# Patient Record
Sex: Female | Born: 1955 | Race: Black or African American | Hispanic: No | Marital: Married | Smoking: Never smoker
Health system: Southern US, Community
[De-identification: ages and names within clinical notes are randomized; demographics above are authoritative.]

## PROBLEM LIST (undated history)

## (undated) DIAGNOSIS — I82409 Acute embolism and thrombosis of unspecified deep veins of unspecified lower extremity: Secondary | ICD-10-CM

## (undated) DIAGNOSIS — R002 Palpitations: Secondary | ICD-10-CM

## (undated) DIAGNOSIS — R110 Nausea: Secondary | ICD-10-CM

## (undated) DIAGNOSIS — D649 Anemia, unspecified: Secondary | ICD-10-CM

## (undated) DIAGNOSIS — M1990 Unspecified osteoarthritis, unspecified site: Secondary | ICD-10-CM

## (undated) DIAGNOSIS — I10 Essential (primary) hypertension: Secondary | ICD-10-CM

## (undated) DIAGNOSIS — R42 Dizziness and giddiness: Secondary | ICD-10-CM

## (undated) HISTORY — DX: Essential (primary) hypertension: I10

## (undated) HISTORY — PX: FRACTURE SURGERY: SHX138

## (undated) HISTORY — DX: Unspecified osteoarthritis, unspecified site: M19.90

## (undated) HISTORY — DX: Dizziness and giddiness: R42

## (undated) HISTORY — PX: COLONOSCOPY: SHX174

## (undated) HISTORY — PX: TUBAL LIGATION: SHX77

## (undated) HISTORY — DX: Nausea: R11.0

## (undated) HISTORY — PX: ABDOMINAL HYSTERECTOMY: SHX81

## (undated) HISTORY — DX: Hypercalcemia: E83.52

## (undated) HISTORY — DX: Palpitations: R00.2

---

## 2000-10-29 ENCOUNTER — Encounter (INDEPENDENT_AMBULATORY_CARE_PROVIDER_SITE_OTHER): Payer: Self-pay

## 2000-10-29 ENCOUNTER — Other Ambulatory Visit: Admission: RE | Admit: 2000-10-29 | Discharge: 2000-10-29 | Payer: Self-pay | Admitting: Obstetrics and Gynecology

## 2001-08-19 ENCOUNTER — Encounter: Payer: Self-pay | Admitting: Obstetrics and Gynecology

## 2001-08-19 ENCOUNTER — Ambulatory Visit (HOSPITAL_COMMUNITY): Admission: RE | Admit: 2001-08-19 | Discharge: 2001-08-19 | Payer: Self-pay | Admitting: Obstetrics and Gynecology

## 2001-09-18 ENCOUNTER — Other Ambulatory Visit: Admission: RE | Admit: 2001-09-18 | Discharge: 2001-09-18 | Payer: Self-pay | Admitting: Obstetrics and Gynecology

## 2002-08-13 ENCOUNTER — Other Ambulatory Visit: Admission: RE | Admit: 2002-08-13 | Discharge: 2002-08-13 | Payer: Self-pay | Admitting: Obstetrics and Gynecology

## 2002-08-26 ENCOUNTER — Encounter: Payer: Self-pay | Admitting: Obstetrics and Gynecology

## 2002-08-26 ENCOUNTER — Ambulatory Visit (HOSPITAL_COMMUNITY): Admission: RE | Admit: 2002-08-26 | Discharge: 2002-08-26 | Payer: Self-pay | Admitting: Obstetrics and Gynecology

## 2003-09-24 ENCOUNTER — Other Ambulatory Visit: Admission: RE | Admit: 2003-09-24 | Discharge: 2003-09-24 | Payer: Self-pay | Admitting: Obstetrics and Gynecology

## 2003-09-30 ENCOUNTER — Ambulatory Visit (HOSPITAL_COMMUNITY): Admission: RE | Admit: 2003-09-30 | Discharge: 2003-09-30 | Payer: Self-pay | Admitting: Obstetrics and Gynecology

## 2004-01-07 ENCOUNTER — Encounter (INDEPENDENT_AMBULATORY_CARE_PROVIDER_SITE_OTHER): Payer: Self-pay | Admitting: *Deleted

## 2004-01-07 ENCOUNTER — Observation Stay (HOSPITAL_COMMUNITY): Admission: RE | Admit: 2004-01-07 | Discharge: 2004-01-08 | Payer: Self-pay | Admitting: Obstetrics and Gynecology

## 2004-10-05 ENCOUNTER — Other Ambulatory Visit: Admission: RE | Admit: 2004-10-05 | Discharge: 2004-10-05 | Payer: Self-pay | Admitting: Obstetrics and Gynecology

## 2004-10-13 ENCOUNTER — Ambulatory Visit (HOSPITAL_COMMUNITY): Admission: RE | Admit: 2004-10-13 | Discharge: 2004-10-13 | Payer: Self-pay | Admitting: Obstetrics and Gynecology

## 2005-08-29 ENCOUNTER — Other Ambulatory Visit: Admission: RE | Admit: 2005-08-29 | Discharge: 2005-08-29 | Payer: Self-pay | Admitting: Obstetrics and Gynecology

## 2005-10-16 ENCOUNTER — Ambulatory Visit (HOSPITAL_COMMUNITY): Admission: RE | Admit: 2005-10-16 | Discharge: 2005-10-16 | Payer: Self-pay | Admitting: Obstetrics and Gynecology

## 2006-10-17 ENCOUNTER — Ambulatory Visit (HOSPITAL_COMMUNITY): Admission: RE | Admit: 2006-10-17 | Discharge: 2006-10-17 | Payer: Self-pay | Admitting: Obstetrics and Gynecology

## 2006-10-18 ENCOUNTER — Ambulatory Visit (HOSPITAL_COMMUNITY): Admission: RE | Admit: 2006-10-18 | Discharge: 2006-10-18 | Payer: Self-pay | Admitting: Obstetrics and Gynecology

## 2007-06-20 HISTORY — PX: OTHER SURGICAL HISTORY: SHX169

## 2007-11-13 ENCOUNTER — Ambulatory Visit (HOSPITAL_COMMUNITY): Admission: RE | Admit: 2007-11-13 | Discharge: 2007-11-13 | Payer: Self-pay | Admitting: Obstetrics and Gynecology

## 2009-04-26 ENCOUNTER — Ambulatory Visit (HOSPITAL_COMMUNITY): Admission: RE | Admit: 2009-04-26 | Discharge: 2009-04-26 | Payer: Self-pay | Admitting: Obstetrics and Gynecology

## 2010-05-02 ENCOUNTER — Ambulatory Visit (HOSPITAL_COMMUNITY)
Admission: RE | Admit: 2010-05-02 | Discharge: 2010-05-02 | Payer: Self-pay | Source: Home / Self Care | Admitting: Obstetrics and Gynecology

## 2010-07-20 ENCOUNTER — Emergency Department (HOSPITAL_COMMUNITY)
Admission: EM | Admit: 2010-07-20 | Discharge: 2010-07-20 | Disposition: A | Discharge status: Home / Self Care | Payer: BC Managed Care – PPO | Attending: Emergency Medicine | Admitting: Emergency Medicine

## 2010-07-20 DIAGNOSIS — I10 Essential (primary) hypertension: Secondary | ICD-10-CM | POA: Insufficient documentation

## 2010-07-20 DIAGNOSIS — R42 Dizziness and giddiness: Secondary | ICD-10-CM | POA: Insufficient documentation

## 2010-07-20 DIAGNOSIS — Z79899 Other long term (current) drug therapy: Secondary | ICD-10-CM | POA: Insufficient documentation

## 2010-07-20 DIAGNOSIS — R51 Headache: Secondary | ICD-10-CM | POA: Insufficient documentation

## 2010-07-20 LAB — POCT CARDIAC MARKERS
CKMB, poc: <1 ng/mL — ABNORMAL LOW (ref 1.0–8.0)
Myoglobin, poc: 59.2 ng/mL (ref 12–200)
Troponin i, poc: <0.05 ng/mL (ref 0.00–0.09)

## 2010-07-20 LAB — URINALYSIS, ROUTINE W REFLEX MICROSCOPIC
Bilirubin Urine: NEGATIVE
Nitrite: NEGATIVE
Specific Gravity, Urine: 1.011 (ref 1.005–1.030)
Urobilinogen, UA: 0.2 mg/dL (ref 0.0–1.0)
pH: 8 (ref 5.0–8.0)

## 2010-07-20 LAB — CBC
HCT: 39.6 % (ref 36.0–46.0)
MCHC: 33.6 g/dL (ref 30.0–36.0)
Platelets: 298 10*3/uL (ref 150–400)
RDW: 12.5 % (ref 11.5–15.5)
WBC: 6 10*3/uL (ref 4.0–10.5)

## 2010-07-20 LAB — POCT I-STAT, CHEM 8
Chloride: 98 mEq/L (ref 96–112)
HCT: 42 % (ref 36.0–46.0)
Hemoglobin: 14.3 g/dL (ref 12.0–15.0)
Potassium: 3.3 mEq/L — ABNORMAL LOW (ref 3.5–5.1)
Sodium: 138 mEq/L (ref 135–145)

## 2010-07-20 LAB — DIFFERENTIAL
Basophils Absolute: 0 10*3/uL (ref 0.0–0.1)
Eosinophils Absolute: 0.1 10*3/uL (ref 0.0–0.7)
Eosinophils Relative: 1 % (ref 0–5)
Lymphocytes Relative: 25 % (ref 12–46)
Monocytes Absolute: 0.4 10*3/uL (ref 0.1–1.0)

## 2010-07-25 ENCOUNTER — Ambulatory Visit
Admission: RE | Admit: 2010-07-25 | Discharge: 2010-07-25 | Disposition: A | Discharge status: Home / Self Care | Payer: BC Managed Care – PPO | Source: Ambulatory Visit | Attending: Internal Medicine | Admitting: Internal Medicine

## 2010-07-25 ENCOUNTER — Other Ambulatory Visit: Payer: Self-pay | Admitting: Internal Medicine

## 2010-07-25 DIAGNOSIS — R0781 Pleurodynia: Secondary | ICD-10-CM

## 2010-11-04 NOTE — H&P (Signed)
NAME:  Sierra Glover, Sierra Glover                       ACCOUNT NO.:  [id number]   MEDICAL RECORD NO.:  [id number]                   PATIENT TYPE:  OBV   LOCATION:  NA                                   FACILITY:  [facility]   PHYSICIAN:  Crist Fat. Rivard, M.D.              DATE OF BIRTH:  11-20-1955   DATE OF ADMISSION:  01/07/2004  DATE OF DISCHARGE:                                HISTORY & PHYSICAL   REASON FOR ADMISSION:  Uterine fibroids with menorrhagia and anemia.   HISTORY OF PRESENT ILLNESS:  This is a 55 year old married African-American  female, gravida 2, para 1, with known fibroids for many years.  She was seen  in April 2005 for her annual exam, reporting irregular cycles varying from  14 to 21 day length, lasting 7 to 14 days with 3 heavy days.  On those days,  she has to use one tampon and one pad every three hours and reports passing  clots as large as 2 cm.  She also now has a new onset of moderate  dysmenorrhea quoted at an intensity of 6/10, improved with Motrin 800 mg.  She has breakthrough bleeding and postmenstrual spotting for 2 to 3 days per  month.  Otherwise, she denies dyspareunia, post coital bleeding.   REVIEW OF SYSTEMS:  HEAD, EYES, EARS, NOSE, AND THROAT:  Negative.  HEART:  Negative.  LUNGS:  Negative.  GENITOURINARY:  Negative.  GASTROINTESTINAL:  Negative.  CONSTITUTIONAL:  Negative.  PSYCHIATRIC:  Negative.   PAST MEDICAL HISTORY:  1. Hypertension.  2. Status post cryotherapy in 1994.  3. Status post bilateral tubal ligation.  4. Uterine fibroids.  5. She has had one vaginal delivery.   ALLERGIES:  No known drug allergies.   CURRENT MEDICATIONS:  1. Hydrochlorothiazide 25 mg daily.  2. Allegra p.r.n.  3. Claritin p.r.n.  4. Aspirin 81 mg daily.  5. Multivitamins.   FAMILY HISTORY:  Remarkable for multiple family members with hypertension.  Negative for feminine or colon cancer.   LABORATORY AND X-RAY DATA:  Most recent evaluation reveals a Pap smear  in  April 2005 benign.   Endometrial biopsy in April 2005 benign secretory endometrium.  No  hyperplasia or malignancy identified.   Mammogram in April 2005 normal.   Sonohistogram on Oct 22, 2003, revealed a 3.6 posterior fibroid, 3.2 fundal  and anterior fibroid, 2.8 posterior fibroid, and 3 cm anterior fibroid, 2.8  cm lower uterine fibroid.  Three of the fibroids appear submucosal.  Right  ovary normal, left ovary not seen, and endometrial thickness 0.69 cm on an  unknown day of cycle.   PHYSICAL EXAMINATION:  VITAL SIGNS:  Current weight is 141 pounds for a  height of 5 feet 4-3/4 inches.  Blood pressure 120/80.  HEAD, EYES, EARS, NOSE, AND THROAT:  Normal.  NECK:  Thyroid not enlarged.  HEART:  Regular rate and rhythm.  CHEST:  Clear.  BREASTS:  Normal.  BACK:  No CVA tenderness.  ABDOMEN:  No tenderness, masses, or hepatosplenomegaly.  EXTREMITIES:  Negative.  NEUROLOGIC:  Within normal limits.  PELVIC:  Normal external genitalia, normal vagina, normal cervix.  Uterus is  anteverted, increased in size to 12 to 14 weeks, irregular but mobile.  Adnexa: No masses felt.  Rectovaginal exam is negative.   IMPRESSION:  Symptomatic uterine fibroids with menorrhagia and anemia.  Last  hemoglobin on September 24, 2003, was 11.0.   PLAN:  Management options have been reviewed with patient including medical  treatment with hormonal manipulation, hysteroscopy myomectomy plus/minus  endometrial ablation, or hysterectomy.  The patient wishes for definitive  treatment, and we have planned for laparoscopic-assisted vaginal  hysterectomy.  The procedure as well as possible complications including  bleeding, infection, and injury to other organs, need for laparotomy,  premature ovarian failure were thoroughly reviewed with patient, and all  questions were answered.  Informed consent was obtained.                                               Crist Fat Rivard, M.D.    SAR/MEDQ  D:   12/31/2003  T:  12/31/2003  Job:  [id number]

## 2010-11-04 NOTE — Op Note (Signed)
NAME:  Sierra Glover, Sierra Glover                       ACCOUNT NO.:  [id number]   MEDICAL RECORD NO.:  [id number]                   PATIENT TYPE:  OBV   LOCATION:  9302                                 FACILITY:  [facility]   PHYSICIAN:  Crist Fat. Rivard, M.D.              DATE OF BIRTH:  [birth year]   DATE OF PROCEDURE:  01/07/2004  DATE OF DISCHARGE:                                 OPERATIVE REPORT   PREOPERATIVE DIAGNOSES:  Uterine fibroids with menorrhagia and anemia.   POSTOPERATIVE DIAGNOSES:  Uterine fibroids with menorrhagia and anemia, left  ovarian benign cystadenoma per frozen section.   ANESTHESIA:  General.   PROCEDURE:  Laparoscopy assisted vaginal hysterectomy and myomectomy with  left salpingo-oophorectomy.   SURGEON:  Crist Fat. Rivard, M.D.   ASSISTANTMarquis Lunch. Lowell Guitar, P.A.   ESTIMATED BLOOD LOSS:  300 mL.   DESCRIPTION OF PROCEDURE:  After being informed of the planned procedure  with possible complications including bleeding, infection, injury to bowel,  bladder, or ureters, need for laparotomy informed consent was obtained. The  patient is taken to OR #2 and given general anesthesia with endotracheal  intubation without difficulty.  She is placed in the lithotomy position,  prepped and draped in a sterile fashion and a intrauterine manipulator is  placed in the uterus through the cervix.  A Foley catheter is inserted in  her bladder.   We proceed with infiltration of the umbilical area using 5 mL of Marcaine  0.25 and perform a 10 mm incision midline overlooking the previous incision  from tubal ligation, insert a Veress needle an insufflate a pneumoperitoneum  with CO2 at a maximum pressure of 15 mmHg.  The Veress needle is removed and  a 10 mm trocar is inserted easily.  A laparoscope is inserted which is  mounted on a video monitor. We are in the abdominopelvic cavity.  Two 5 mm  trocar are inserted in the left and right lower quadrant under direct  visualization  after infiltrating with 3 mL each of Marcaine 0.25.    Observation:  The uterus has multiple fibroids and is compatible with 5-14  week size.  Tubes are status post tubal ligation. Right ovary is normal,  left ovary is fairly large measuring 6.5 cm x 5 cm with 3 cysts or 1  multiloculated cyst. The cyst wall appears very small, there are no  nodularities seen. The uteroovarian ligament on that side is lengthened  which is compatible with an organic cyst.  The appendix is not visualized,  liver is normal and the stomach is well visualized and filled with air.  A  nasogastric tube is inserted to correct this completely.  We then insert a  10 mm suprapubic trocar after infiltrating with 4 mL of Marcaine 0.25 due to  the extreme size of the uterus which is very difficult to mobilize.  With  these three operative trocars, we are able to  mobilize the uterus enough to  visualize both ureters easily. We decide to proceed with our planned  procedure and start with cauterizing a round ligament on each using a  tripolar forceps.  These ligaments are then cauterized and sectioned. The  broad ligament is incised so to develop a bladder flap.  Both uteroovarian  ligaments are then isolated with the tripolar, cauterized and sectioned.  This brings [place] down to the uterine artery on each side.  We then proceed  with a vaginal time.  A weighted speculum is inserted, the cervix is grasped  with two Gerilyn Pilgrim forceps and the vaginal mucosa around the cervix is  infiltrated with lidocaine 1% epinephrine 1:100,000, 15 mL.  We can then  incise the vaginal mucosa in a circumferential manner and bluntly dissect  anterior posterior cul-de-sac.  The posterior cul-de-sac is entered easily  and a Bonanno weighted speculum is inserted. The anterior cul-de-sac is not  readily available but we are able to proceed with clamping of both  uterosacral ligaments using a Rogers forceps sectioning those ligaments and  suturing them  with a transfixed suture of #0 Vicryl kept for future  suspension.  We can now isolate the cardinal ligaments on each side with  Roger's forceps, section and suture them a transfixed suture of #0 Vicryl  and we can now proceed clamping uterine vessels on each side with Roger's  forceps sectioning those pedicles and suturing them with #0 Vicryl.  When we  proceed with section of the uterine vessels, this easily opens up the  anterior cul-de-sac.  It is impossible to deliver the uterus due to its size  and we proceed with morcellation and myomectomy. The cervix is split  longitudinally and the lower uterine body is split longitudinally up to the  fundus. In that process, we remove multiple fibroids bluntly which  significantly reduces the size of the uterus and we are now able to deliver  the uterus via the posterior cul-de-sac.  The last pedicle on each side is  clamped with a Roger's forceps and sectioned and the uterus is removed  completely with its cervix.  The final pedicles are sutured with a  transfixed suture of #0 Vicryl.  During that process, the left pedicle tears  and we have active arterial bleeding on that side. Fortunately the artery is  grasped with a Roger's forceps, suction is applied. We minimize the size of  that pedicle and we doubly suture it with #0 Vicryl. We then systematically  review hemostasis of all pedicles and this is found to be satisfactory.   We then do a hemostatic locked running suture on the posterior vaginal edge  reuniting the vaginal mucosa with both uterosacral ligaments using a #0  Vicryl.  We then close the peritoneum with two #0 Vicryl suture reuniting  the posterior peritoneum, uterosacral ligaments and anterior peritoneum on  each side.  Both uterosacral ligaments are then attached midline for future  suspension.  We can now close the vaginal mucosa using figure-of-eight stitches of #0 Vicryl. Hemostasis is quite adequate. We return to the   laparoscopy time, reinsufflate the pneumoperitoneum with CO2, irrigated  profusely and complete hemostasis on the left side on one of the left  pedicles using cautery. Both ureters are visualized and show normal  peristaltism and no dilatation.  We can now freely assess the left ovary  which was difficult to assess due to the size of the uterus and fairly  immobilize uterus.  We see  three distinct cysts with thickening of the  capsule between each cyst which could easily represent a multiloculated  cyst. Again there are no normal vessels, no nodularity but because of the  age of the patient and a normal appearing right ovary, a decision is made to  proceed with left salpingo-oophorectomy. The infundibulopelvic ligament is  identified away from the ureter and cauterized and sectioned using the  tripolar.  Via the 10 mm suprapubic trocar and an endobag, we can remove the  entire adnexa without any spillage. The bag is brought to the abdominal wall  and the cyst is ruptured in the bag, aspirated which reduces its volume and  is removed completely from the pelvic cavity without any spillage. The cyst  is then sent for frozen section.  The suprapubic incision is then closed  with a figure-of-eight stitch of #0 Vicryl closing the fascia and a 3-0  Vicryl subcu suture closing the skin. We again return in the pelvic cavity,  reinsffulate the pneumoperitoneum, irrigated profusely with warm saline and  note a small area of bleeding on the vaginal vault which is cauterized using  tripolar.  We again recheck all pedicles which now have a satisfactory  hemostasis and we recheck ureters which still maintain normal peristaltism  and no dilatation. Trocars are now removed under direct visualization after  removing all instruments and evacuating the pneumoperitoneum.  The fascia of  the umbilical incision is closed with a figure-of-eight stitch of #0 Vicryl  and all three remaining incisions are then closed  with subcuticular suture  of 3-0 Vicryl and Steri-Strips.   Instrument and sponge count is complete x2.  Estimated blood loss is 300 mL.  The procedure is very well tolerated by the patient who is taken to recovery  room in a well and stable condition.   Frozen section was called back from Dr. Jimmy Picket which revealed a benign  cystadenoma, uncertain whether it is serous or mucinous but definitely  benign cystadenoma.                                               Crist Fat Rivard, M.D.    SAR/MEDQ  D:  01/07/2004  T:  01/07/2004  Job:  [id number]

## 2010-11-04 NOTE — Discharge Summary (Signed)
NAME:  Sierra Glover, Sierra Glover                       ACCOUNT NO.:  [id number]   MEDICAL RECORD NO.:  [id number]                   PATIENT TYPE:  OBV   LOCATION:  9302                                 FACILITY:  [facility]   PHYSICIAN:  Crist Fat. Rivard, M.D.              DATE OF BIRTH:  1956-05-27   DATE OF ADMISSION:  01/07/2004  DATE OF DISCHARGE:  01/08/2004                                 DISCHARGE SUMMARY   DISCHARGE DIAGNOSES:  1. Symptomatic uterine fibroids.  2. Menorrhagia.  3. Anemia.  4. Left ovarian corpus luteum cyst.   OPERATION:  On the date of admission the patient underwent a laparoscopic-  assisted vaginal hysterectomy, myomectomy, and left salpingo-oophorectomy,  tolerating procedure well.  The patient was found to have a fibroid uterus  (weighing 420.4 g) with a left ovarian cyst (benign cystadenoma per frozen  section) with normal-appearing left and right tubes and right ovary.   HISTORY OF PRESENT ILLNESS:  Mrs. Wagoner is a 55 year old married African-  American female gravida 2 para 1 who presents for laparoscopic-assisted  vaginal hysterectomy because of symptomatic uterine fibroids.  Please see  the patient's dictated History and Physical Examination for details.   PREOPERATIVE PHYSICAL EXAMINATION:  VITAL SIGNS:  Blood pressure 120/80,  weight 141 pounds, height 5 feet 4.75 inches tall.  GENERAL:  Within normal limits.  PELVIC:  Normal external genitalia, normal vagina, normal cervix.  The  uterus is anteverted, increased to a size of 12-14 weeks, irregular but  mobile.  Adnexa without any palpable masses.  Rectovaginal exam was  negative.   HOSPITAL COURSE:  On the date of admission the patient underwent  aforementioned procedures, tolerating them all well.  Postoperative course  was unremarkable with the patient maintaining stability with a postoperative  hemoglobin of 6.2 (preoperative hemoglobin 10.4).  Having resumed bowel and  bladder function and achieving  the maximum benefit of her hospital stay, the  patient was discharged home on postoperative day #1.   DISCHARGE MEDICATIONS:  1. Ferrous sulfate 325 mg one tablet twice daily for 6 weeks.  2. Ibuprofen 600 mg one tablet with food q.6h. for 3 days then as needed for     pain.  3. Phenergan 25 mg one tablet q.6h. as needed for nausea.  4. Tylox one to two tablets q.4-6h. as needed for pain.   FOLLOW-UP:  The patient is scheduled for a 6 weeks postoperative visit with  Dr. Estanislado Pandy on February 17, 2004 at 3:15 p.m.   DISCHARGE INSTRUCTIONS:  The patient was given a copy of [facility] postoperative instruction sheet.  She was further advised to avoid  driving for 2 weeks, heavy lifting for 4 weeks, and intercourse for 6 weeks.  The patient's diet was without restriction.   FINAL PATHOLOGY:  1. Oophorectomy and salpingectomy, left ovary and fallopian tube:  Left     ovary with hemorrhagic corpus luteum.  Left  fallopian tube:  No     pathologic diagnosis.  2. Hysterectomy, uterus and cervix:  Cervix:  Squamous metaplasia and     chronic cervicitis.  Myometrium:  Multiple leiomyomata measuring up to     5.7 cm.  Serosal surface:  No pathologic diagnosis.  Endometrium:  Weakly-     secretory endometrium, negative for hyperplasia or malignancy.     [place] J. Adline Peals.                    Crist Fat Rivard, M.D.    EJP/MEDQ  D:  01/29/2004  T:  01/29/2004  Job:  [id number]

## 2010-11-17 DIAGNOSIS — E559 Vitamin D deficiency, unspecified: Secondary | ICD-10-CM | POA: Insufficient documentation

## 2011-04-04 ENCOUNTER — Other Ambulatory Visit (HOSPITAL_COMMUNITY): Payer: Self-pay | Admitting: Obstetrics and Gynecology

## 2011-04-04 DIAGNOSIS — Z1231 Encounter for screening mammogram for malignant neoplasm of breast: Secondary | ICD-10-CM

## 2011-05-05 ENCOUNTER — Ambulatory Visit (HOSPITAL_COMMUNITY)
Admission: RE | Admit: 2011-05-05 | Discharge: 2011-05-05 | Disposition: A | Discharge status: Home / Self Care | Payer: BC Managed Care – PPO | Source: Ambulatory Visit | Attending: Obstetrics and Gynecology | Admitting: Obstetrics and Gynecology

## 2011-05-05 DIAGNOSIS — Z1231 Encounter for screening mammogram for malignant neoplasm of breast: Secondary | ICD-10-CM | POA: Insufficient documentation

## 2012-06-27 ENCOUNTER — Ambulatory Visit: Payer: Self-pay | Admitting: Obstetrics and Gynecology

## 2013-05-27 DIAGNOSIS — R7309 Other abnormal glucose: Secondary | ICD-10-CM | POA: Insufficient documentation

## 2013-05-27 DIAGNOSIS — N951 Menopausal and female climacteric states: Secondary | ICD-10-CM | POA: Insufficient documentation

## 2014-02-18 ENCOUNTER — Other Ambulatory Visit (HOSPITAL_COMMUNITY): Payer: Self-pay | Admitting: Obstetrics and Gynecology

## 2014-02-18 DIAGNOSIS — Z1231 Encounter for screening mammogram for malignant neoplasm of breast: Secondary | ICD-10-CM

## 2014-03-04 ENCOUNTER — Ambulatory Visit (HOSPITAL_COMMUNITY)
Admission: RE | Admit: 2014-03-04 | Discharge: 2014-03-04 | Disposition: A | Discharge status: Home / Self Care | Payer: BC Managed Care – PPO | Source: Ambulatory Visit | Attending: Obstetrics and Gynecology | Admitting: Obstetrics and Gynecology

## 2014-03-04 DIAGNOSIS — Z1231 Encounter for screening mammogram for malignant neoplasm of breast: Secondary | ICD-10-CM | POA: Diagnosis not present

## 2014-08-06 ENCOUNTER — Other Ambulatory Visit: Payer: Self-pay | Admitting: Nurse Practitioner

## 2014-08-06 ENCOUNTER — Ambulatory Visit
Admission: RE | Admit: 2014-08-06 | Discharge: 2014-08-06 | Disposition: A | Discharge status: Home / Self Care | Payer: BLUE CROSS/BLUE SHIELD | Source: Ambulatory Visit | Attending: Nurse Practitioner | Admitting: Nurse Practitioner

## 2014-08-06 DIAGNOSIS — M25511 Pain in right shoulder: Secondary | ICD-10-CM

## 2016-01-24 ENCOUNTER — Other Ambulatory Visit: Payer: Self-pay | Admitting: Nurse Practitioner

## 2016-01-24 ENCOUNTER — Ambulatory Visit
Admission: RE | Admit: 2016-01-24 | Discharge: 2016-01-24 | Disposition: A | Discharge status: Home / Self Care | Payer: BLUE CROSS/BLUE SHIELD | Source: Ambulatory Visit | Attending: Internal Medicine | Admitting: Internal Medicine

## 2016-01-24 DIAGNOSIS — M549 Dorsalgia, unspecified: Secondary | ICD-10-CM

## 2016-03-02 IMAGING — CR DG RIBS W/ CHEST 3+V*L*
3 series · 3 of 3 positions shown · non-contrast
Comparison: Chest X ray July 25, 2010 and right rib detail
series of today's date

CLINICAL DATA: Right lateral chest wall and shoulder pain for the
past 3 weeks ; no respiratory discomfort, no known injury

EXAM:
LEFT RIBS AND CHEST - 3+ VIEW

[w chest pa *]
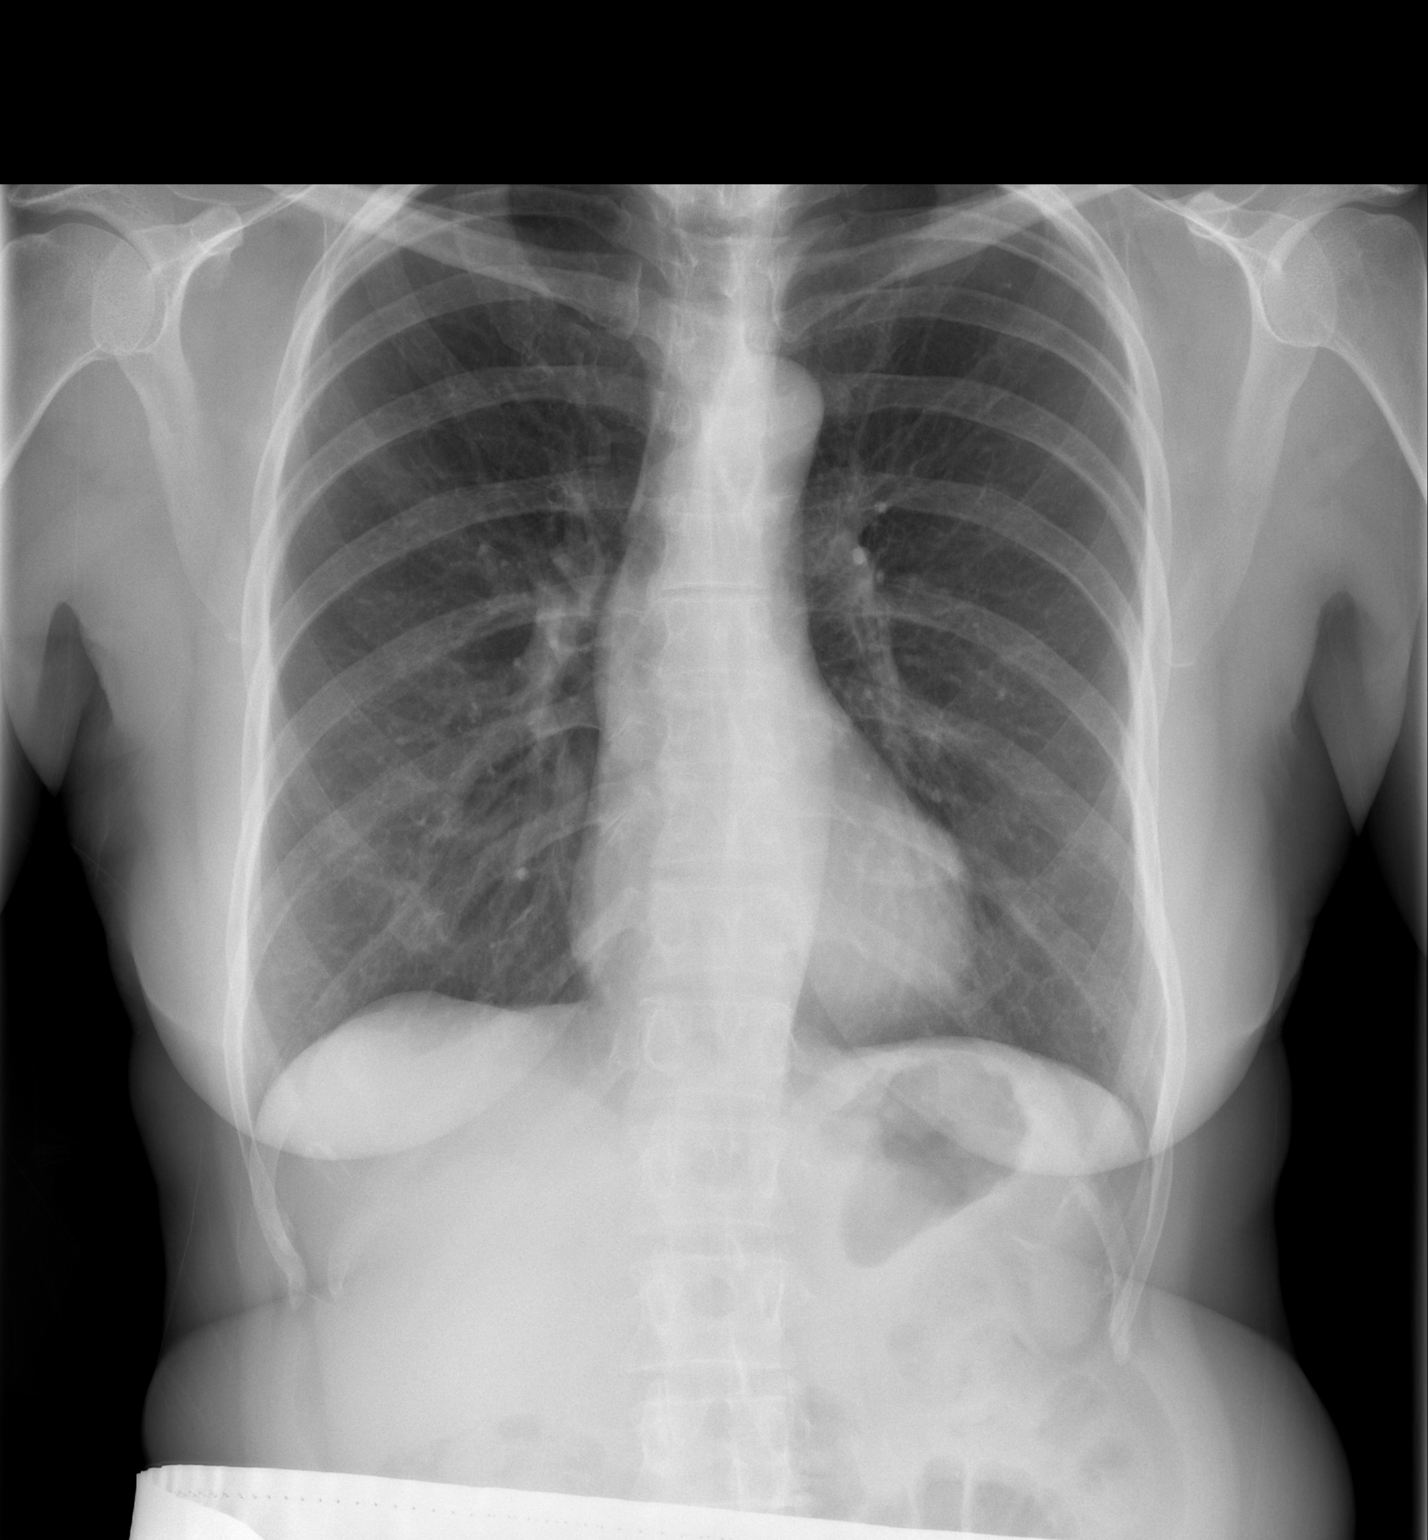

[w ribs ap/pa upper left]
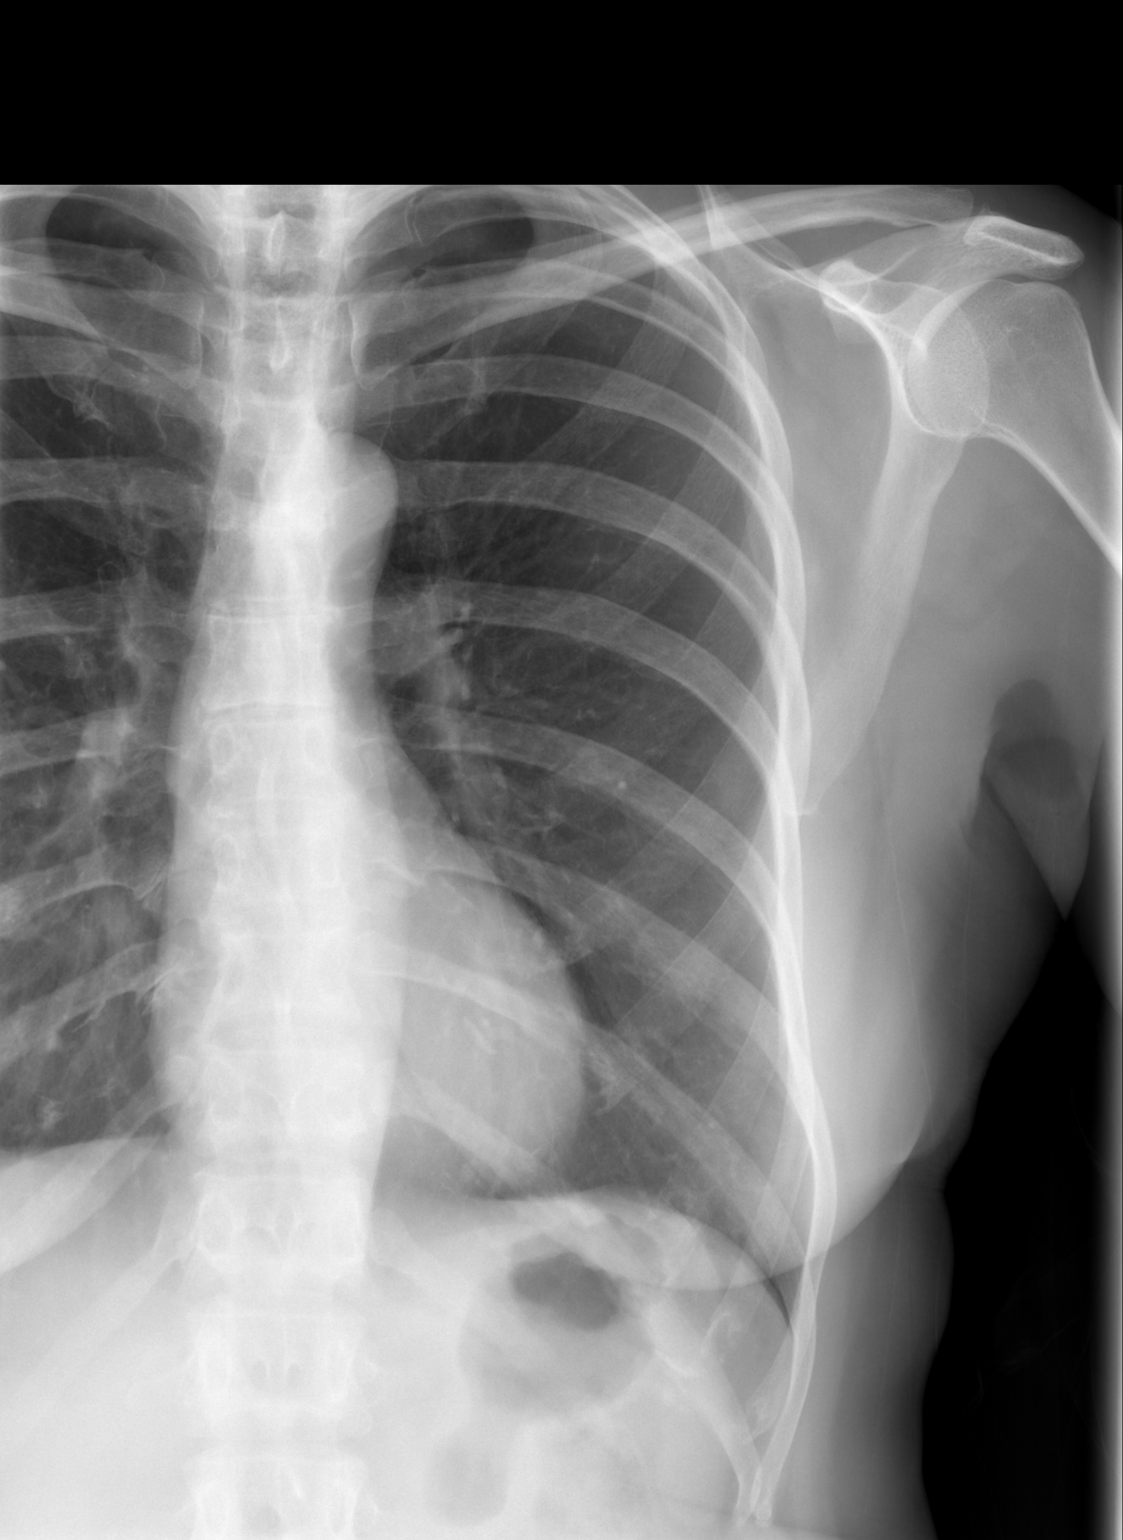

[w ribs oblique left]
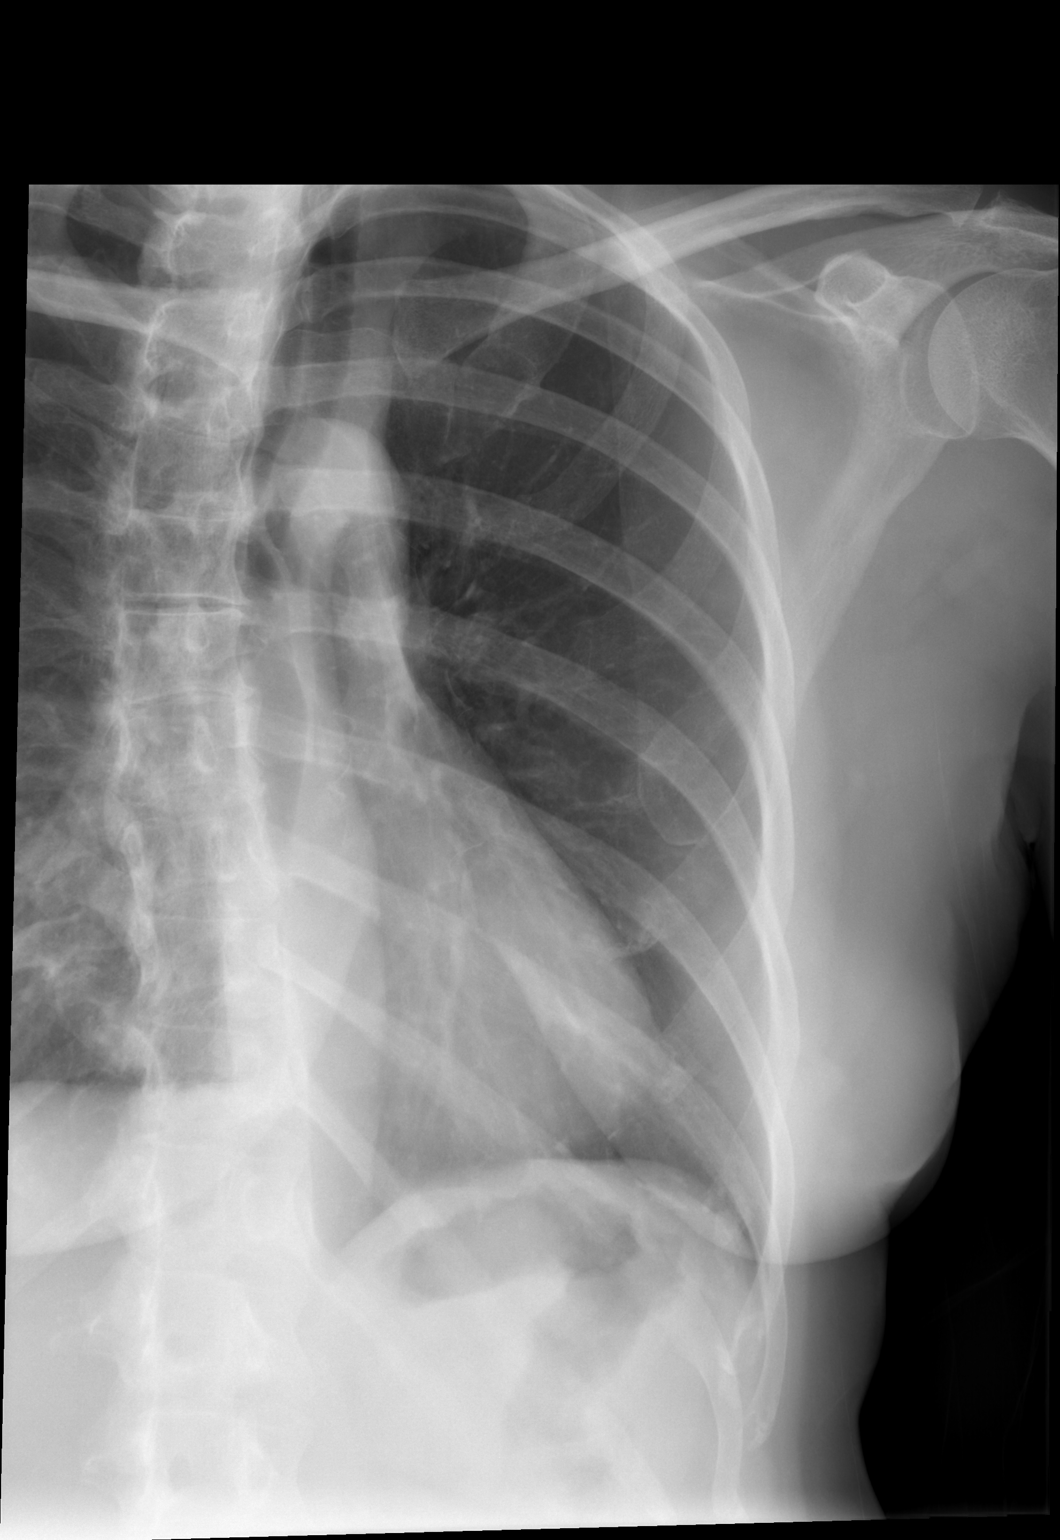

[3 of 3 positions shown; findings below may reference images not displayed]

FINDINGS: The lungs are well-expanded and clear. The heart and mediastinal
structures are normal. There is no pleural effusion or pneumothorax.
Left rib detail views reveal no acute or healing fracture. There is
no lytic or blastic lesion.
IMPRESSION: There is no active cardiopulmonary disease. No acute abnormality of
the left ribs is demonstrated.

## 2016-05-26 LAB — HM COLONOSCOPY

## 2017-10-03 ENCOUNTER — Other Ambulatory Visit: Payer: Self-pay | Admitting: Internal Medicine

## 2017-10-03 DIAGNOSIS — Z1231 Encounter for screening mammogram for malignant neoplasm of breast: Secondary | ICD-10-CM

## 2017-10-24 ENCOUNTER — Ambulatory Visit
Admission: RE | Admit: 2017-10-24 | Discharge: 2017-10-24 | Disposition: A | Discharge status: Home / Self Care | Payer: BLUE CROSS/BLUE SHIELD | Source: Ambulatory Visit | Attending: Internal Medicine | Admitting: Internal Medicine

## 2017-10-24 DIAGNOSIS — Z1231 Encounter for screening mammogram for malignant neoplasm of breast: Secondary | ICD-10-CM

## 2018-03-11 DIAGNOSIS — R300 Dysuria: Secondary | ICD-10-CM | POA: Diagnosis not present

## 2018-03-21 ENCOUNTER — Other Ambulatory Visit: Payer: Self-pay

## 2018-03-21 ENCOUNTER — Telehealth: Payer: Self-pay

## 2018-03-21 MED ORDER — CIPROFLOXACIN HCL 500 MG PO TABS: 500.0000 mg | ORAL_TABLET | Freq: Two times a day (BID) | ORAL | 0 refills | Status: AC

## 2018-03-21 NOTE — Telephone Encounter (Signed)
Patient notified that she has a UTI and we have sent her a prescription to the pharmacy. YRL,RMA

## 2018-03-26 ENCOUNTER — Telehealth: Payer: Self-pay

## 2018-03-26 NOTE — Telephone Encounter (Signed)
Patient is requesting lab results from her urine culture and vaginal swab. Please advise YRL,RMA

## 2018-03-27 NOTE — Telephone Encounter (Signed)
She is negative for bacterial vaginosis but she still has a UTI, send a Rx for Macrobid 150 mg twice a day and have her to come back after she completes the course of medication for a lab visit Ua/urine culture.

## 2018-03-29 ENCOUNTER — Other Ambulatory Visit: Payer: Self-pay

## 2018-03-29 NOTE — Telephone Encounter (Signed)
I am unable to locate the order for macrobid 150mg  bid for 5 days can you please put this order in will you put the order in so I can send it. YRL,RMA

## 2018-04-01 ENCOUNTER — Telehealth: Payer: Self-pay

## 2018-04-01 ENCOUNTER — Other Ambulatory Visit: Payer: Self-pay

## 2018-04-01 DIAGNOSIS — N390 Urinary tract infection, site not specified: Secondary | ICD-10-CM

## 2018-04-01 MED ORDER — NITROFURANTOIN MONOHYD MACRO 100 MG PO CAPS: 100.0000 mg | ORAL_CAPSULE | Freq: Two times a day (BID) | ORAL | 0 refills | Status: AC

## 2018-04-01 NOTE — Telephone Encounter (Signed)
Pt notified that her Rx has been sent to the pharmacy. YRL,RMA

## 2018-05-08 ENCOUNTER — Encounter: Payer: Self-pay | Admitting: Internal Medicine

## 2018-05-08 ENCOUNTER — Ambulatory Visit: Payer: BLUE CROSS/BLUE SHIELD | Admitting: Internal Medicine

## 2018-05-08 VITALS — BP 110/68 | HR 67 | Temp 98.2°F | Ht 63.5 in | Wt 150.8 lb

## 2018-05-08 DIAGNOSIS — N951 Menopausal and female climacteric states: Secondary | ICD-10-CM

## 2018-05-08 DIAGNOSIS — I10 Essential (primary) hypertension: Secondary | ICD-10-CM | POA: Diagnosis not present

## 2018-05-08 DIAGNOSIS — Z0000 Encounter for general adult medical examination without abnormal findings: Secondary | ICD-10-CM | POA: Diagnosis not present

## 2018-05-08 DIAGNOSIS — Z23 Encounter for immunization: Secondary | ICD-10-CM | POA: Diagnosis not present

## 2018-05-08 LAB — POCT URINALYSIS DIPSTICK
BILIRUBIN UA: NEGATIVE
Blood, UA: NEGATIVE
Glucose, UA: NEGATIVE
Ketones, UA: NEGATIVE
LEUKOCYTES UA: NEGATIVE
Nitrite, UA: NEGATIVE
PH UA: 8.5 — AB (ref 5.0–8.0)
Protein, UA: POSITIVE — AB
Spec Grav, UA: 1.015 (ref 1.010–1.025)
UROBILINOGEN UA: 0.2 U/dL

## 2018-05-08 LAB — POCT UA - MICROALBUMIN
Albumin/Creatinine Ratio, Urine, POC: 30
CREATININE, POC: 100 mg/dL
MICROALBUMIN (UR) POC: 10 mg/L

## 2018-05-08 MED ORDER — TETANUS-DIPHTH-ACELL PERTUSSIS 5-2.5-18.5 LF-MCG/0.5 IM SUSP
0.5000 mL | Freq: Once | INTRAMUSCULAR | Status: AC
  Administered 2018-05-08: 0.5 mL via INTRAMUSCULAR

## 2018-05-08 MED ORDER — ESTRADIOL 1 MG PO TABS: ORAL_TABLET | ORAL | 0 refills | Status: DC

## 2018-05-08 NOTE — Patient Instructions (Signed)
Exercising to Stay Healthy Exercising regularly is important. It has many health benefits, such as:  Improving your overall fitness, flexibility, and endurance.  Increasing your bone density.  Helping with weight control.  Decreasing your body fat.  Increasing your muscle strength.  Reducing stress and tension.  Improving your overall health.  In order to become healthy and stay healthy, it is recommended that you do moderate-intensity and vigorous-intensity exercise. You can tell that you are exercising at a moderate intensity if you have a higher heart rate and faster breathing, but you are still able to hold a conversation. You can tell that you are exercising at a vigorous intensity if you are breathing much harder and faster and cannot hold a conversation while exercising. How often should I exercise? Choose an activity that you enjoy and set realistic goals. Your health care provider can help you to make an activity plan that works for you. Exercise regularly as directed by your health care provider. This may include:  Doing resistance training twice each week, such as: ? Push-ups. ? Sit-ups. ? Lifting weights. ? Using resistance bands.  Doing a given intensity of exercise for a given amount of time. Choose from these options: ? 150 minutes of moderate-intensity exercise every week. ? 75 minutes of vigorous-intensity exercise every week. ? A mix of moderate-intensity and vigorous-intensity exercise every week.  Children, pregnant women, people who are out of shape, people who are overweight, and older adults may need to consult a health care provider for individual recommendations. If you have any sort of medical condition, be sure to consult your health care provider before starting a new exercise program. What are some exercise ideas? Some moderate-intensity exercise ideas include:  Walking at a rate of 1 mile in 15  minutes.  Biking.  Hiking.  Golfing.  Dancing.  Some vigorous-intensity exercise ideas include:  Walking at a rate of at least 4.5 miles per hour.  Jogging or running at a rate of 5 miles per hour.  Biking at a rate of at least 10 miles per hour.  Lap swimming.  Roller-skating or in-line skating.  Cross-country skiing.  Vigorous competitive sports, such as football, basketball, and soccer.  Jumping rope.  Aerobic dancing.  What are some everyday activities that can help me to get exercise?  Yard work, such as: ? Pushing a lawn mower. ? Raking and bagging leaves.  Washing and waxing your car.  Pushing a stroller.  Shoveling snow.  Gardening.  Washing windows or floors. How can I be more active in my day-to-day activities?  Use the stairs instead of the elevator.  Take a walk during your lunch break.  If you drive, park your car farther away from work or school.  If you take public transportation, get off one stop early and walk the rest of the way.  Make all of your phone calls while standing up and walking around.  Get up, stretch, and walk around every 30 minutes throughout the day. What guidelines should I follow while exercising?  Do not exercise so much that you hurt yourself, feel dizzy, or get very short of breath.  Consult your health care provider before starting a new exercise program.  Wear comfortable clothes and shoes with good support.  Drink plenty of water while you exercise to prevent dehydration or heat stroke. Body water is lost during exercise and must be replaced.  Work out until you breathe faster and your heart beats faster. This information is not   intended to replace advice given to you by your health care provider. Make sure you discuss any questions you have with your health care provider. Document Released: 07/08/2010 Document Revised: 11/11/2015 Document Reviewed: 11/06/2013 Elsevier Interactive Patient Education  2018  [employer]  

## 2018-05-09 LAB — LIPID PANEL
Chol/HDL Ratio: 4 ratio (ref 0.0–4.4)
Cholesterol, Total: 202 mg/dL — ABNORMAL HIGH (ref 100–199)
HDL: 51 mg/dL
LDL Calculated: 119 mg/dL — ABNORMAL HIGH (ref 0–99)
Triglycerides: 158 mg/dL — ABNORMAL HIGH (ref 0–149)
VLDL Cholesterol Cal: 32 mg/dL (ref 5–40)

## 2018-05-09 LAB — CBC
HEMATOCRIT: 39 % (ref 34.0–46.6)
HEMOGLOBIN: 12.8 g/dL (ref 11.1–15.9)
MCH: 27.7 pg (ref 26.6–33.0)
MCHC: 32.8 g/dL (ref 31.5–35.7)
MCV: 84 fL (ref 79–97)
Platelets: 298 10*3/uL (ref 150–450)
RBC: 4.62 x10E6/uL (ref 3.77–5.28)
RDW: 12.3 % (ref 12.3–15.4)
WBC: 5.8 10*3/uL (ref 3.4–10.8)

## 2018-05-09 LAB — CMP14+EGFR
ALK PHOS: 74 IU/L (ref 39–117)
ALT: 11 IU/L (ref 0–32)
AST: 21 IU/L (ref 0–40)
Albumin/Globulin Ratio: 1.5 (ref 1.2–2.2)
Albumin: 4.3 g/dL (ref 3.6–4.8)
BILIRUBIN TOTAL: 0.6 mg/dL (ref 0.0–1.2)
BUN/Creatinine Ratio: 12 (ref 12–28)
BUN: 10 mg/dL (ref 8–27)
CO2: 28 mmol/L (ref 20–29)
CREATININE: 0.83 mg/dL (ref 0.57–1.00)
Calcium: 9.8 mg/dL (ref 8.7–10.3)
Chloride: 98 mmol/L (ref 96–106)
GFR calc Af Amer: 87 mL/min/{1.73_m2} (ref >59)
GFR calc non Af Amer: 76 mL/min/{1.73_m2} (ref >59)
GLOBULIN, TOTAL: 2.8 g/dL (ref 1.5–4.5)
GLUCOSE: 84 mg/dL (ref 65–99)
POTASSIUM: 3.4 mmol/L — AB (ref 3.5–5.2)
Sodium: 140 mmol/L (ref 134–144)
Total Protein: 7.1 g/dL (ref 6.0–8.5)

## 2018-05-09 LAB — HEMOGLOBIN A1C
ESTIMATED AVERAGE GLUCOSE: 105 mg/dL
Hgb A1c MFr Bld: 5.3 % (ref 4.8–5.6)

## 2018-05-10 NOTE — Progress Notes (Signed)
Here are your lab results:  Your liver and kidney function are stable. Your potassium level is slightly low.  Please increase intake of potassium rich foods. I can mail you a list of foods if you like. Your blood count is normal. Your LDL, bad cholesterol, is 119. Ideally, this should be less than 100. Your hba1c is 5.3, you are not prediabetic.   Please let me know if you have any questions. Happy Thanksgiving to you and your family!  Sincerely,    Alhaji Mcneal N. Allyne GeeSanders, MD

## 2018-05-12 ENCOUNTER — Encounter: Payer: Self-pay | Admitting: Internal Medicine

## 2018-05-12 DIAGNOSIS — N951 Menopausal and female climacteric states: Secondary | ICD-10-CM | POA: Insufficient documentation

## 2018-05-12 DIAGNOSIS — I10 Essential (primary) hypertension: Secondary | ICD-10-CM | POA: Insufficient documentation

## 2018-05-12 NOTE — Progress Notes (Signed)
Subjective:     Patient ID: Sierra Glover , female    DOB: [birth year] , 62 y.o.   MRN: [id number]   Chief Complaint  Patient presents with  . Annual Exam  . Hypertension    HPI  She is here today for a full physical exam. She has her pelvic exams performed by Dr. Cletis Media.   Hypertension  This is a chronic problem. The current episode started more than 1 year ago. The problem has been gradually improving since onset. The problem is controlled. Pertinent negatives include no blurred vision, chest pain, headaches, neck pain, orthopnea, palpitations or shortness of breath. The current treatment provides moderate improvement. There are no compliance problems.      Past Medical History:  Diagnosis Date  . HTN (hypertension)      Family History  Problem Relation Age of Onset  . Alzheimer's disease Mother   . Hypertension Mother   . Hypertension Father   . COPD Father   . Hypertension Other      Current Outpatient Medications:  .  estradiol (ESTRACE) 1 MG tablet, take 1 tablet by oral route  every day, Disp: 90 tablet, Rfl: 0 .  fluticasone (VERAMYST) 27.5 MCG/SPRAY nasal spray, Place 2 sprays into the nose daily., Disp: , Rfl:  .  levocetirizine (XYZAL) 5 MG tablet, every evening., Disp: , Rfl: 2 .  triamterene-hydrochlorothiazide (MAXZIDE-25) 37.5-25 MG tablet, Take 1 tablet by mouth daily., Disp: , Rfl: 2  Current Facility-Administered Medications:  .  Tdap (BOOSTRIX) injection 0.5 mL, 0.5 mL, Intramuscular, Once, Glendale Chard, MD   No Known Allergies   Review of Systems  Constitutional: Negative.   HENT: Negative.   Eyes: Negative.  Negative for blurred vision.  Respiratory: Negative.  Negative for shortness of breath.   Cardiovascular: Negative.  Negative for chest pain, palpitations and orthopnea.  Gastrointestinal: Negative.   Endocrine: Negative.   Genitourinary: Positive for menstrual problem.       She sometimes still has hot flashes. She is on estradiol as  per Dr. Cletis Media.   Musculoskeletal: Negative.  Negative for neck pain.  Skin: Negative.   Allergic/Immunologic: Negative.   Neurological: Negative.  Negative for headaches.  Hematological: Negative.   Psychiatric/Behavioral: Negative.      Today's Vitals   05/08/18 1043  BP: 110/68  Pulse: 67  Temp: 98.2 F (36.8 C)  TempSrc: Oral  Weight: 150 lb 12.8 oz (68.4 kg)  Height: 5' 3.5" (1.613 m)   Body mass index is 26.29 kg/m.   Objective:  Physical Exam  Constitutional: She is oriented to person, place, and time. She appears well-developed and well-nourished.  HENT:  Head: Normocephalic and atraumatic.  Right Ear: External ear normal.  Left Ear: External ear normal.  Nose: Nose normal.  Mouth/Throat: Oropharynx is clear and moist.  Eyes: Pupils are equal, round, and reactive to light. Conjunctivae and EOM are normal.  Neck: Normal range of motion. Neck supple.  Cardiovascular: Normal rate, regular rhythm, normal heart sounds and intact distal pulses.  Pulmonary/Chest: Effort normal and breath sounds normal. Right breast exhibits no inverted nipple, no mass, no nipple discharge, no skin change and no tenderness. Left breast exhibits no inverted nipple, no mass, no nipple discharge, no skin change and no tenderness.  Abdominal: Soft. Bowel sounds are normal.  Genitourinary:  Genitourinary Comments: deferred  Musculoskeletal: Normal range of motion.  Neurological: She is alert and oriented to person, place, and time.  Skin: Skin is warm and dry.  Psychiatric: She has a normal mood and affect.  Nursing note and vitals reviewed.       Assessment And Plan:     1. Routine general medical examination at health care facility  A full exam was performed.  Importance of monthly self breast exams was discussed with the patient.  PATIENT HAS BEEN ADVISED TO GET 30-45 MINUTES REGULAR EXERCISE NO LESS THAN FOUR TO FIVE DAYS PER WEEK - BOTH WEIGHTBEARING EXERCISES AND AEROBIC ARE  RECOMMENDED.  SHE IS ADVISED TO FOLLOW A HEALTHY DIET WITH AT LEAST SIX FRUITS/VEGGIES PER DAY, DECREASE INTAKE OF RED MEAT, AND TO INCREASE FISH INTAKE TO TWO DAYS PER WEEK.  MEATS/FISH SHOULD NOT BE FRIED, BAKED OR BROILED IS PREFERABLE.  I SUGGEST WEARING SPF 50 SUNSCREEN ON EXPOSED PARTS AND ESPECIALLY WHEN IN THE DIRECT SUNLIGHT FOR AN EXTENDED PERIOD OF TIME.  PLEASE AVOID FAST FOOD RESTAURANTS AND INCREASE YOUR WATER INTAKE.  - CMP14+EGFR - CBC - Lipid panel - Hemoglobin A1c  2. Essential hypertension, benign  Well controlled. She will continue with current meds. She is encouraged to avoid adding salt to her foods.  She will rto in 6 months for re-evaluation.   - POCT Urinalysis Dipstick (81002) - POCT UA - Microalbumin - EKG 12-Lead  3. Female climacteric state  She is currently taking oral estradiol. She has been taking 1/2 tablet of 54m estradiol. I decreased this, rx originally prescribed by her GYN. Increased risk of clotting associated with estradiol was discussed with the patient. She agrees to cut back to 1/2tab MWF. Our goal is to get her off of this completely within 90 days. She will let me know if her sx worsen.   4. Need for vaccination  - Tdap (BOOSTRIX) injection 0.5 mL        RMaximino Greenland MD

## 2018-06-11 ENCOUNTER — Encounter: Payer: Self-pay | Admitting: Internal Medicine

## 2018-06-16 ENCOUNTER — Other Ambulatory Visit: Payer: Self-pay | Admitting: Internal Medicine

## 2018-07-05 ENCOUNTER — Other Ambulatory Visit: Payer: Self-pay | Admitting: Internal Medicine

## 2018-09-20 ENCOUNTER — Other Ambulatory Visit: Payer: Self-pay

## 2018-09-20 MED ORDER — VALACYCLOVIR HCL 500 MG PO TABS: ORAL_TABLET | ORAL | 0 refills | Status: AC

## 2018-11-06 ENCOUNTER — Other Ambulatory Visit: Payer: Self-pay

## 2018-11-06 ENCOUNTER — Ambulatory Visit: Payer: BLUE CROSS/BLUE SHIELD | Admitting: Internal Medicine

## 2018-11-06 ENCOUNTER — Encounter: Payer: Self-pay | Admitting: Internal Medicine

## 2018-11-06 VITALS — BP 142/86 | Temp 97.1°F | Wt 147.0 lb

## 2018-11-06 DIAGNOSIS — N951 Menopausal and female climacteric states: Secondary | ICD-10-CM

## 2018-11-06 DIAGNOSIS — R195 Other fecal abnormalities: Secondary | ICD-10-CM

## 2018-11-06 DIAGNOSIS — I10 Essential (primary) hypertension: Secondary | ICD-10-CM | POA: Diagnosis not present

## 2018-11-06 DIAGNOSIS — J301 Allergic rhinitis due to pollen: Secondary | ICD-10-CM

## 2018-11-06 NOTE — Patient Instructions (Signed)

## 2018-11-09 NOTE — Progress Notes (Signed)
Virtual Visit via Video   This visit type was conducted due to national recommendations for restrictions regarding the COVID-19 Pandemic (e.g. social distancing) in an effort to limit this patient's exposure and mitigate transmission in our community.  Due to her co-morbid illnesses, this patient is at least at moderate risk for complications without adequate follow up.  This format is felt to be most appropriate for this patient at this time.  All issues noted in this document were discussed and addressed.  A limited physical exam was performed with this format.    This visit type was conducted due to national recommendations for restrictions regarding the COVID-19 Pandemic (e.g. social distancing) in an effort to limit this patient's exposure and mitigate transmission in our community.  Patients identity confirmed using two different identifiers.  This format is felt to be most appropriate for this patient at this time.  All issues noted in this document were discussed and addressed.  No physical exam was performed (except for noted visual exam findings with Video Visits).    Date:  11/09/2018   ID:  Sierra Glover, DOB Aug 03, 1955, MRN [id number]  Patient Location:  Home  Provider location:   Office    Chief Complaint:  Bp check  History of Present Illness:    Sierra Glover is a 63 y.o. female who presents via video conferencing for a telehealth visit today.    The patient does not have symptoms concerning for COVID-19 infection (fever, chills, cough, or new shortness of breath).   She presents today for virtual visit. She prefers this method of contact due to COVID-19 pandemic. She admits that this pandemic has caused her more stress. She is working from home, and has taken in teenage nieces.   Hypertension  This is a chronic problem. The current episode started more than 1 year ago. The problem has been gradually improving since onset. The problem is controlled. Pertinent  negatives include no blurred vision, chest pain, palpitations or shortness of breath. Past treatments include diuretics. The current treatment provides moderate improvement.     Past Medical History:  Diagnosis Date  . HTN (hypertension)    Past Surgical History:  Procedure Laterality Date  . partal hysterectomy  2009     Current Meds  Medication Sig  . estradiol (ESTRACE) 1 MG tablet take 1 tablet by oral route  every day  . fluticasone (VERAMYST) 27.5 MCG/SPRAY nasal spray Place 2 sprays into the nose daily.  Marland Kitchen levocetirizine (XYZAL) 5 MG tablet TAKE 1 TABLET BY MOUTH EVERY DAY IN THE EVENING  . triamterene-hydrochlorothiazide (MAXZIDE-25) 37.5-25 MG tablet TAKE 1 TABLET BY MOUTH EVERY DAY  . valACYclovir (VALTREX) 500 MG tablet take 1 tablet by oral route  every day     Allergies:   Patient has no known allergies.   Social History   Tobacco Use  . Smoking status: Never Smoker  . Smokeless tobacco: Never Used  Substance Use Topics  . Alcohol use: Yes    Comment: social  . Drug use: Never     Family Hx: The patient's family history includes Alzheimer's disease in her mother; COPD in her father; Hypertension in her father, mother, and another family member.  ROS:   Please see the history of present illness.    Review of Systems  Constitutional: Negative.   Eyes: Negative for blurred vision.  Respiratory: Negative.  Negative for shortness of breath.   Cardiovascular: Negative.  Negative for chest pain and palpitations.  Gastrointestinal: Negative.        She c/o yellow stool. Not sure what may have caused her symptoms.  No associated abdominal pain. No constipation. No diarrhea. No fever/chills.   Neurological: Negative.   Psychiatric/Behavioral: Negative.     All other systems reviewed and are negative.   Labs/Other Tests and Data Reviewed:    Recent Labs: 05/08/2018: ALT 11; BUN 10; Creatinine, Ser 0.83; Hemoglobin 12.8; Platelets 298; Potassium 3.4; Sodium 140    Recent Lipid Panel Lab Results  Component Value Date/Time   CHOL 202 (H) 05/08/2018 11:41 AM   TRIG 158 (H) 05/08/2018 11:41 AM   HDL 51 05/08/2018 11:41 AM   CHOLHDL 4.0 05/08/2018 11:41 AM   LDLCALC 119 (H) 05/08/2018 11:41 AM    Wt Readings from Last 3 Encounters:  11/06/18 147 lb (66.7 kg)  05/08/18 150 lb 12.8 oz (68.4 kg)     Exam:    Vital Signs:  BP (!) 142/86 (BP Location: Right Arm, Patient Position: Sitting, Cuff Size: Normal)   Temp (!) 97.1 F (36.2 C) (Oral)   Wt 147 lb (66.7 kg)   BMI 25.63 kg/m     Physical Exam  Constitutional: She is oriented to person, place, and time and well-developed, well-nourished, and in no distress.  HENT:  Head: Normocephalic and atraumatic.  Neck: Normal range of motion.  Pulmonary/Chest: Effort normal.  Neurological: She is alert and oriented to person, place, and time.  Psychiatric: Affect normal.  Nursing note and vitals reviewed.   ASSESSMENT & PLAN:     1. Essential hypertension, benign  Fair control. She will continue with current meds. She is encouraged to avoid adding salt to her foods. She will rto in six months for her next physical examination. Importance of regular exercise was discussed with the patient, especially given her added stress.   - CMP14+EGFR; Future  2. Seasonal allergic rhinitis due to pollen  Chronic. She will continue with current meds. She is encouraged to let me know if she develops fever along with her sx.   3. Discoloration of stool  She will let me know if her sx persist. She feels her sx are resolving at this time. She is encouraged to let me know if her sx recur, and to pay attention to any dietary changes which could contribute to her sx.   4. Female climacteric state  Chronic. She reports she is now completely off of estradiol. She is advised to try Remifemin OTC should her sx recur as we get into the summer months.     COVID-19 Education: The signs and symptoms of COVID-19  were discussed with the patient and how to seek care for testing (follow up with PCP or arrange E-visit).  The importance of social distancing was discussed today.  Patient Risk:   After full review of this patients clinical status, I feel that they are at least moderate risk at this time.  Time:   Today, I have spent 10 minutes/ 30 seconds with the patient with telehealth technology discussing above diagnoses.     Medication Adjustments/Labs and Tests Ordered: Current medicines are reviewed at length with the patient today.  Concerns regarding medicines are outlined above.   Tests Ordered: Orders Placed This Encounter  Procedures  . CMP14+EGFR    Medication Changes: No orders of the defined types were placed in this encounter.   Disposition:  Follow up in 6 month(s).  Signed, [name], MD

## 2019-01-24 ENCOUNTER — Other Ambulatory Visit: Payer: Self-pay | Admitting: Internal Medicine

## 2019-04-03 ENCOUNTER — Other Ambulatory Visit: Payer: Self-pay

## 2019-04-03 ENCOUNTER — Encounter: Payer: Self-pay | Admitting: Internal Medicine

## 2019-04-03 ENCOUNTER — Ambulatory Visit: Payer: BC Managed Care – PPO | Admitting: Internal Medicine

## 2019-04-03 ENCOUNTER — Other Ambulatory Visit: Payer: Self-pay | Admitting: Internal Medicine

## 2019-04-03 VITALS — BP 128/82 | HR 78 | Temp 98.6°F | Ht 63.5 in | Wt 152.0 lb

## 2019-04-03 DIAGNOSIS — B351 Tinea unguium: Secondary | ICD-10-CM

## 2019-04-03 DIAGNOSIS — M899 Disorder of bone, unspecified: Secondary | ICD-10-CM | POA: Insufficient documentation

## 2019-04-03 DIAGNOSIS — H15021 Brawny scleritis, right eye: Secondary | ICD-10-CM | POA: Insufficient documentation

## 2019-04-03 DIAGNOSIS — R229 Localized swelling, mass and lump, unspecified: Secondary | ICD-10-CM | POA: Insufficient documentation

## 2019-04-03 MED ORDER — CICLOPIROX 8 % EX SOLN: Freq: Every day | CUTANEOUS | 0 refills | Status: DC

## 2019-04-03 NOTE — Progress Notes (Signed)
Subjective:     Patient ID: Sierra Glover , female    DOB: 03/19/1956 , 63 y.o.   MRN: [id number]   Chief Complaint  Patient presents with  . Nail Problem    nail discoloration     HPI  L ring finger yellow discoloration x 2 month. She denies injuring this nail. This area is not painful and is not thick. It is detaching from the nail bed. Her other nails are normal. Denies hx of psoriasis, but there is eczema in her family.   Past Medical History:  Diagnosis Date  . HTN (hypertension)      Family History  Problem Relation Age of Onset  . Alzheimer's disease Mother   . Hypertension Mother   . Hypertension Father   . COPD Father   . Hypertension Other      Current Outpatient Medications:  .  fluticasone (FLONASE) 50 MCG/ACT nasal spray, SPRAY 1 SPRAY INTO EACH NOSTRIL EVERY DAY, Disp: 48 mL, Rfl: 1 .  levocetirizine (XYZAL) 5 MG tablet, TAKE 1 TABLET BY MOUTH EVERY DAY IN THE EVENING, Disp: 90 tablet, Rfl: 2 .  triamterene-hydrochlorothiazide (MAXZIDE-25) 37.5-25 MG tablet, TAKE 1 TABLET BY MOUTH EVERY DAY, Disp: 90 tablet, Rfl: 2 .  valACYclovir (VALTREX) 500 MG tablet, take 1 tablet by oral route  every day, Disp: 90 tablet, Rfl: 0 .  estradiol (ESTRACE) 1 MG tablet, take 1 tablet by oral route  every day (Patient not taking: Reported on 04/03/2019), Disp: 90 tablet, Rfl: 0 .  fluticasone (VERAMYST) 27.5 MCG/SPRAY nasal spray, Place 2 sprays into the nose daily., Disp: , Rfl:    No Known Allergies   Review of Systems  No fever, pain, redness, ecchymosis, injury or rashes present Today's Vitals   04/03/19 0902  BP: 128/82  Pulse: 78  Temp: 98.6 F (37 C)  TempSrc: Oral  Weight: 152 lb (68.9 kg)  Height: 5' 3.5" (1.613 m)   Body mass index is 26.5 kg/m.   Objective:  Physical Exam Vitals signs and nursing note reviewed.  Constitutional:      Appearance: Normal appearance.  HENT:     Head: Atraumatic.     Right Ear: External ear normal.     Left Ear:  External ear normal.     Nose: Nose normal.  Eyes:     General: Scleral icterus present.     Conjunctiva/sclera: Conjunctivae normal.  Neck:     Musculoskeletal: Neck supple.  Pulmonary:     Effort: Pulmonary effort is normal.  Skin:    General: Skin is warm and dry.     Findings: No bruising, lesion or rash.     Comments: L ring finger nail is thin and light yellow  And looks like is detaching from nail bed.   Neurological:     Mental Status: She is alert and oriented to person, place, and time.  Psychiatric:        Mood and Affect: Mood normal.        Behavior: Behavior normal.        Thought Content: Thought content normal.        Judgment: Judgment normal.      Assessment And Plan:   1. Onychomycosis- acute. Will try penlac for now.  - Culture, fungus without smear I discussed oral antifungals and how they affect the liver and needs to check them frequently while on them. She opted to get pathology of this nail for assurance of diagnosis since the  look of this could also be psoriasis. I trimmed a piece of her nail to send out.  We will inform her of the results when they are back.  Cammeron Greis RODRIGUEZ-SOUTHWORTH, PA-C    THE PATIENT IS ENCOURAGED TO PRACTICE SOCIAL DISTANCING DUE TO THE COVID-19 PANDEMIC.

## 2019-04-03 NOTE — Patient Instructions (Signed)
Fungal Nail Infection A fungal nail infection is a common infection of the toenails or fingernails. This condition affects toenails more often than fingernails. It often affects the great, or big, toes. More than one nail may be infected. The condition can be passed from person to person (is contagious). What are the causes? This condition is caused by a fungus. Several types of fungi can cause the infection. These fungi are common in moist and warm areas. If your hands or feet come into contact with the fungus, it may get into a crack in your fingernail or toenail and cause the infection. What increases the risk? The following factors may make you more likely to develop this condition:  Being female.  Being of older age.  Living with someone who has the fungus.  Walking barefoot in areas where the fungus thrives, such as showers or locker rooms.  Wearing shoes and socks that cause your feet to sweat.  Having a nail injury or a recent nail surgery.  Having certain medical conditions, such as: ? Athlete's foot. ? Diabetes. ? Psoriasis. ? Poor circulation. ? A weak body defense system (immune system). What are the signs or symptoms? Symptoms of this condition include:  A pale spot on the nail.  Thickening of the nail.  A nail that becomes yellow or brown.  A brittle or ragged nail edge.  A crumbling nail.  A nail that has lifted away from the nail bed. How is this diagnosed? This condition is diagnosed with a physical exam. Your health care provider may take a scraping or clipping from your nail to test for the fungus. How is this treated? Treatment is not needed for mild infections. If you have significant nail changes, treatment may include:  Antifungal medicines taken by mouth (orally). You may need to take the medicine for several weeks or several months, and you may not see the results for a long time. These medicines can cause side effects. Ask your health care provider  what problems to watch for.  Antifungal nail polish or nail cream. These may be used along with oral antifungal medicines.  Laser treatment of the nail.  Surgery to remove the nail. This may be needed for the most severe infections. It can take a long time, usually up to a year, for the infection to go away. The infection may also come back. Follow these instructions at home: Medicines  Take or apply over-the-counter and prescription medicines only as told by your health care provider.  Ask your health care provider about using over-the-counter mentholated ointment on your nails. Nail care  Trim your nails often.  Wash and dry your hands and feet every day.  Keep your feet dry: ? Wear absorbent socks, and change your socks frequently. ? Wear shoes that allow air to circulate, such as sandals or canvas tennis shoes. Throw out old shoes.  Do not use artificial nails.  If you go to a nail salon, make sure you choose one that uses clean instruments.  Use antifungal foot powder on your feet and in your shoes. General instructions  Do not share personal items, such as towels or nail clippers.  Do not walk barefoot in shower rooms or locker rooms.  Wear rubber gloves if you are working with your hands in wet areas.  Keep all follow-up visits as told by your health care provider. This is important. Contact a health care provider if: Your infection is not getting better or it is getting worse   after several months. Summary  A fungal nail infection is a common infection of the toenails or fingernails.  Treatment is not needed for mild infections. If you have significant nail changes, treatment may include taking medicine orally and applying medicine to your nails.  It can take a long time, usually up to a year, for the infection to go away. The infection may also come back.  Take or apply over-the-counter and prescription medicines only as told by your health care provider.   Follow instructions for taking care of your nails to help prevent infection from coming back or spreading. This information is not intended to replace advice given to you by your health care provider. Make sure you discuss any questions you have with your health care provider. Document Released: 06/02/2000 Document Revised: 09/26/2018 Document Reviewed: 11/09/2017 [employer] Patient Education  2020 [employer]  

## 2019-04-23 ENCOUNTER — Encounter: Payer: Self-pay | Admitting: Internal Medicine

## 2019-04-23 ENCOUNTER — Other Ambulatory Visit: Payer: Self-pay | Admitting: Internal Medicine

## 2019-04-23 DIAGNOSIS — Z1231 Encounter for screening mammogram for malignant neoplasm of breast: Secondary | ICD-10-CM

## 2019-04-24 ENCOUNTER — Encounter: Payer: Self-pay | Admitting: Internal Medicine

## 2019-04-27 ENCOUNTER — Other Ambulatory Visit: Payer: Self-pay | Admitting: Internal Medicine

## 2019-05-01 ENCOUNTER — Encounter: Payer: Self-pay | Admitting: Internal Medicine

## 2019-05-14 ENCOUNTER — Other Ambulatory Visit: Payer: Self-pay

## 2019-05-14 ENCOUNTER — Ambulatory Visit: Payer: BC Managed Care – PPO | Admitting: Internal Medicine

## 2019-05-14 ENCOUNTER — Encounter: Payer: Self-pay | Admitting: Internal Medicine

## 2019-05-14 VITALS — BP 130/78 | HR 63 | Temp 98.5°F | Ht 65.4 in | Wt 150.2 lb

## 2019-05-14 DIAGNOSIS — M25562 Pain in left knee: Secondary | ICD-10-CM | POA: Diagnosis not present

## 2019-05-14 DIAGNOSIS — Z01419 Encounter for gynecological examination (general) (routine) without abnormal findings: Secondary | ICD-10-CM | POA: Diagnosis not present

## 2019-05-14 DIAGNOSIS — I10 Essential (primary) hypertension: Secondary | ICD-10-CM

## 2019-05-14 DIAGNOSIS — Z1211 Encounter for screening for malignant neoplasm of colon: Secondary | ICD-10-CM | POA: Diagnosis not present

## 2019-05-14 DIAGNOSIS — Z0000 Encounter for general adult medical examination without abnormal findings: Secondary | ICD-10-CM

## 2019-05-14 LAB — POCT URINALYSIS DIPSTICK
Bilirubin, UA: NEGATIVE
Blood, UA: NEGATIVE
Glucose, UA: NEGATIVE
Ketones, UA: NEGATIVE
Leukocytes, UA: NEGATIVE
Nitrite, UA: NEGATIVE
Protein, UA: NEGATIVE
Spec Grav, UA: 1.02 (ref 1.010–1.025)
Urobilinogen, UA: 0.2 E.U./dL
pH, UA: 5 (ref 5.0–8.0)

## 2019-05-14 MED ORDER — CICLOPIROX 8 % EX SOLN: Freq: Every day | CUTANEOUS | 0 refills | Status: DC

## 2019-05-14 NOTE — Patient Instructions (Signed)
Health Maintenance, Female Adopting a healthy lifestyle and getting preventive care are important in promoting health and wellness. Ask your health care provider about:  The right schedule for you to have regular tests and exams.  Things you can do on your own to prevent diseases and keep yourself healthy. What should I know about diet, weight, and exercise? Eat a healthy diet   Eat a diet that includes plenty of vegetables, fruits, low-fat dairy products, and lean protein.  Do not eat a lot of foods that are high in solid fats, added sugars, or sodium. Maintain a healthy weight Body mass index (BMI) is used to identify weight problems. It estimates body fat based on height and weight. Your health care provider can help determine your BMI and help you achieve or maintain a healthy weight. Get regular exercise Get regular exercise. This is one of the most important things you can do for your health. Most adults should:  Exercise for at least 150 minutes each week. The exercise should increase your heart rate and make you sweat (moderate-intensity exercise).  Do strengthening exercises at least twice a week. This is in addition to the moderate-intensity exercise.  Spend less time sitting. Even light physical activity can be beneficial. Watch cholesterol and blood lipids Have your blood tested for lipids and cholesterol at 63 years of age, then have this test every 5 years. Have your cholesterol levels checked more often if:  Your lipid or cholesterol levels are high.  You are older than 63 years of age.  You are at high risk for heart disease. What should I know about cancer screening? Depending on your health history and family history, you may need to have cancer screening at various ages. This may include screening for:  Breast cancer.  Cervical cancer.  Colorectal cancer.  Skin cancer.  Lung cancer. What should I know about heart disease, diabetes, and high blood  pressure? Blood pressure and heart disease  High blood pressure causes heart disease and increases the risk of stroke. This is more likely to develop in people who have high blood pressure readings, are of African descent, or are overweight.  Have your blood pressure checked: ? Every 3-5 years if you are 18-39 years of age. ? Every year if you are 40 years old or older. Diabetes Have regular diabetes screenings. This checks your fasting blood sugar level. Have the screening done:  Once every three years after age 40 if you are at a normal weight and have a low risk for diabetes.  More often and at a younger age if you are overweight or have a high risk for diabetes. What should I know about preventing infection? Hepatitis B If you have a higher risk for hepatitis B, you should be screened for this virus. Talk with your health care provider to find out if you are at risk for hepatitis B infection. Hepatitis C Testing is recommended for:  Everyone born from 1945 through 1965.  Anyone with known risk factors for hepatitis C. Sexually transmitted infections (STIs)  Get screened for STIs, including gonorrhea and chlamydia, if: ? You are sexually active and are younger than 63 years of age. ? You are older than 63 years of age and your health care provider tells you that you are at risk for this type of infection. ? Your sexual activity has changed since you were last screened, and you are at increased risk for chlamydia or gonorrhea. Ask your health care provider if   you are at risk.  Ask your health care provider about whether you are at high risk for HIV. Your health care provider may recommend a prescription medicine to help prevent HIV infection. If you choose to take medicine to prevent HIV, you should first get tested for HIV. You should then be tested every 3 months for as long as you are taking the medicine. Pregnancy  If you are about to stop having your period (premenopausal) and  you may become pregnant, seek counseling before you get pregnant.  Take 400 to 800 micrograms (mcg) of folic acid every day if you become pregnant.  Ask for birth control (contraception) if you want to prevent pregnancy. Osteoporosis and menopause Osteoporosis is a disease in which the bones lose minerals and strength with aging. This can result in bone fractures. If you are 65 years old or older, or if you are at risk for osteoporosis and fractures, ask your health care provider if you should:  Be screened for bone loss.  Take a calcium or vitamin D supplement to lower your risk of fractures.  Be given hormone replacement therapy (HRT) to treat symptoms of menopause. Follow these instructions at home: Lifestyle  Do not use any products that contain nicotine or tobacco, such as cigarettes, e-cigarettes, and chewing tobacco. If you need help quitting, ask your health care provider.  Do not use street drugs.  Do not share needles.  Ask your health care provider for help if you need support or information about quitting drugs. Alcohol use  Do not drink alcohol if: ? Your health care provider tells you not to drink. ? You are pregnant, may be pregnant, or are planning to become pregnant.  If you drink alcohol: ? Limit how much you use to 0-1 drink a day. ? Limit intake if you are breastfeeding.  Be aware of how much alcohol is in your drink. In the [place] one drink equals one 12 oz bottle of beer (355 mL), one 5 oz glass of wine (148 mL), or one 1 oz glass of hard liquor (44 mL). General instructions  Schedule regular health, dental, and eye exams.  Stay current with your vaccines.  Tell your health care provider if: ? You often feel depressed. ? You have ever been abused or do not feel safe at home. Summary  Adopting a healthy lifestyle and getting preventive care are important in promoting health and wellness.  Follow your health care provider's instructions about healthy  diet, exercising, and getting tested or screened for diseases.  Follow your health care provider's instructions on monitoring your cholesterol and blood pressure. This information is not intended to replace advice given to you by your health care provider. Make sure you discuss any questions you have with your health care provider. Document Released: 12/19/2010 Document Revised: 05/29/2018 Document Reviewed: 05/29/2018 Elsevier Patient Education  2020 [employer]  

## 2019-05-15 LAB — CMP14+EGFR
ALT: 13 IU/L (ref 0–32)
AST: 22 IU/L (ref 0–40)
Albumin/Globulin Ratio: 1.5 (ref 1.2–2.2)
Albumin: 4.6 g/dL (ref 3.8–4.8)
Alkaline Phosphatase: 97 IU/L (ref 39–117)
BUN/Creatinine Ratio: 12 (ref 12–28)
BUN: 11 mg/dL (ref 8–27)
Bilirubin Total: 0.6 mg/dL (ref 0.0–1.2)
CO2: 26 mmol/L (ref 20–29)
Calcium: 10.4 mg/dL — ABNORMAL HIGH (ref 8.7–10.3)
Chloride: 99 mmol/L (ref 96–106)
Creatinine, Ser: 0.95 mg/dL (ref 0.57–1.00)
GFR calc Af Amer: 74 mL/min/{1.73_m2} (ref >59)
GFR calc non Af Amer: 64 mL/min/{1.73_m2} (ref >59)
Globulin, Total: 3.1 g/dL (ref 1.5–4.5)
Glucose: 89 mg/dL (ref 65–99)
Potassium: 3.9 mmol/L (ref 3.5–5.2)
Sodium: 141 mmol/L (ref 134–144)
Total Protein: 7.7 g/dL (ref 6.0–8.5)

## 2019-05-15 LAB — LIPID PANEL
Chol/HDL Ratio: 4.1 ratio (ref 0.0–4.4)
Cholesterol, Total: 214 mg/dL — ABNORMAL HIGH (ref 100–199)
HDL: 52 mg/dL (ref >39)
LDL Chol Calc (NIH): 138 mg/dL — ABNORMAL HIGH (ref 0–99)
Triglycerides: 134 mg/dL (ref 0–149)
VLDL Cholesterol Cal: 24 mg/dL (ref 5–40)

## 2019-05-15 LAB — CBC
Hematocrit: 40.6 % (ref 34.0–46.6)
Hemoglobin: 13.5 g/dL (ref 11.1–15.9)
MCH: 28.6 pg (ref 26.6–33.0)
MCHC: 33.3 g/dL (ref 31.5–35.7)
MCV: 86 fL (ref 79–97)
Platelets: 318 10*3/uL (ref 150–450)
RBC: 4.72 x10E6/uL (ref 3.77–5.28)
RDW: 12.6 % (ref 11.7–15.4)
WBC: 5.3 10*3/uL (ref 3.4–10.8)

## 2019-05-15 LAB — HEMOGLOBIN A1C
Est. average glucose Bld gHb Est-mCnc: 111 mg/dL
Hgb A1c MFr Bld: 5.5 % (ref 4.8–5.6)

## 2019-05-16 NOTE — Progress Notes (Signed)
Subjective:     Patient ID: Sierra Glover , female    DOB: Sep 18, 1955 , 63 y.o.   MRN: [id number]   Chief Complaint  Patient presents with  . Annual Exam    with pap  . Hypertension    HPI  She is here today for a full physical examination.  She would like to have a pap smear as well.  She is no longer followed by GYN.   Hypertension This is a chronic problem. The current episode started more than 1 year ago. The problem has been gradually improving since onset. The problem is controlled. Pertinent negatives include no blurred vision, chest pain, palpitations or shortness of breath. Past treatments include diuretics. The current treatment provides moderate improvement.     Past Medical History:  Diagnosis Date  . HTN (hypertension)      Family History  Problem Relation Age of Onset  . Alzheimer's disease Mother   . Hypertension Mother   . Hypertension Father   . COPD Father   . Hypertension Other      Current Outpatient Medications:  .  ciclopirox (PENLAC) 8 % solution, Apply topically at bedtime. Apply over nail and surrounding skin. Apply daily over previous coat. After seven (7) days, may remove with alcohol and continue cycle., Disp: 6.6 mL, Rfl: 0 .  fluticasone (FLONASE) 50 MCG/ACT nasal spray, SPRAY 1 SPRAY INTO EACH NOSTRIL EVERY DAY, Disp: 48 mL, Rfl: 1 .  fluticasone (VERAMYST) 27.5 MCG/SPRAY nasal spray, Place 2 sprays into the nose daily., Disp: , Rfl:  .  levocetirizine (XYZAL) 5 MG tablet, TAKE 1 TABLET BY MOUTH EVERY DAY IN THE EVENING, Disp: 90 tablet, Rfl: 2 .  triamterene-hydrochlorothiazide (MAXZIDE-25) 37.5-25 MG tablet, TAKE 1 TABLET BY MOUTH EVERY DAY, Disp: 90 tablet, Rfl: 0 .  valACYclovir (VALTREX) 500 MG tablet, take 1 tablet by oral route  every day, Disp: 90 tablet, Rfl: 0   No Known Allergies    The patient states she uses status post hysterectomy for birth control. Last LMP was No LMP recorded. Patient has had a hysterectomy.. Negative  for Dysmenorrhea Negative for: breast discharge, breast lump(s), breast pain and breast self exam. Associated symptoms include abnormal vaginal bleeding. Pertinent negatives include abnormal bleeding (hematology), anxiety, decreased libido, depression, difficulty falling sleep, dyspareunia, history of infertility, nocturia, sexual dysfunction, sleep disturbances, urinary incontinence, urinary urgency, vaginal discharge and vaginal itching. Diet regular.The patient states her exercise level is  intermittent.   . The patient's tobacco use is:  Social History   Tobacco Use  Smoking Status Never Smoker  Smokeless Tobacco Never Used  . She has been exposed to passive smoke. The patient's alcohol use is:  Social History   Substance and Sexual Activity  Alcohol Use Yes   Comment: social    Review of Systems  Constitutional: Negative.   HENT: Negative.   Eyes: Negative.  Negative for blurred vision.  Respiratory: Negative.  Negative for shortness of breath.   Cardiovascular: Negative.  Negative for chest pain and palpitations.  Endocrine: Negative.   Genitourinary: Negative.   Musculoskeletal: Positive for arthralgias.       She c/o left knee pain.  There is pain with ambulation. She denies fall/trauma. She thinks she may have twisted it when running/jogging. She has tried Tylenol w/o relief of her sx.   Skin: Negative.   Allergic/Immunologic: Negative.   Neurological: Negative.   Hematological: Negative.   Psychiatric/Behavioral: Negative.      Today's Vitals  05/14/19 0938  BP: 130/78  Pulse: 63  Temp: 98.5 F (36.9 C)  TempSrc: Oral  Weight: 150 lb 3.2 oz (68.1 kg)  Height: 5' 5.4" (1.661 m)   Body mass index is 24.69 kg/m.   Objective:  Physical Exam Vitals signs and nursing note reviewed. Exam conducted with a chaperone present.  Constitutional:      Appearance: Normal appearance.  HENT:     Head: Normocephalic and atraumatic.     Right Ear: Tympanic membrane, ear  canal and external ear normal.     Left Ear: Tympanic membrane, ear canal and external ear normal.     Nose: Nose normal.     Mouth/Throat:     Mouth: Mucous membranes are moist.     Pharynx: Oropharynx is clear.  Eyes:     Extraocular Movements: Extraocular movements intact.     Conjunctiva/sclera: Conjunctivae normal.     Pupils: Pupils are equal, round, and reactive to light.  Neck:     Musculoskeletal: Normal range of motion and neck supple.  Cardiovascular:     Rate and Rhythm: Normal rate and regular rhythm.     Pulses: Normal pulses.     Heart sounds: Normal heart sounds.  Pulmonary:     Effort: Pulmonary effort is normal.     Breath sounds: Normal breath sounds.  Chest:     Breasts: Tanner Score is 5.        Right: Normal.        Left: Normal.  Abdominal:     General: Abdomen is flat. Bowel sounds are normal.     Palpations: Abdomen is soft.  Genitourinary:    General: Normal vulva.     Exam position: Lithotomy position.     Tanner stage (genital): 5.     Vagina: Normal.     Uterus: Absent.      Rectum: Normal.     Comments: Deferred, cervix not identified. Uterus is absent.  Musculoskeletal: Normal range of motion.     Left knee: She exhibits swelling and bony tenderness. Tenderness found.  Skin:    General: Skin is warm and dry.  Neurological:     General: No focal deficit present.     Mental Status: She is alert and oriented to person, place, and time.  Psychiatric:        Mood and Affect: Mood normal.        Behavior: Behavior normal.         Assessment And Plan:     1. Routine general medical examination at health care facility  A full exam was performed.  Importance of monthly self breast exams was discussed with the patient. PATIENT HAS BEEN ADVISED TO GET 30-45 MINUTES REGULAR EXERCISE NO LESS THAN FOUR TO FIVE DAYS PER WEEK - BOTH WEIGHTBEARING EXERCISES AND AEROBIC ARE RECOMMENDED.  SHE WAS ADVISED TO FOLLOW A HEALTHY DIET WITH AT LEAST SIX  FRUITS/VEGGIES PER DAY, DECREASE INTAKE OF RED MEAT, AND TO INCREASE FISH INTAKE TO TWO DAYS PER WEEK.  MEATS/FISH SHOULD NOT BE FRIED, BAKED OR BROILED IS PREFERABLE.  I SUGGEST WEARING SPF 50 SUNSCREEN ON EXPOSED PARTS AND ESPECIALLY WHEN IN THE DIRECT SUNLIGHT FOR AN EXTENDED PERIOD OF TIME.  PLEASE AVOID FAST FOOD RESTAURANTS AND INCREASE YOUR WATER INTAKE.  - CMP14+EGFR - CBC - Lipid panel - Hemoglobin A1c  2. Cervical smear, as part of routine gynecological examination  Pap smear/pelvic exam was performed.  Stool is heme negative.   - Cytology -  Pap Smear  3. Essential hypertension, benign  Chronic, controlled. She will continue with current meds. EKG performed, no new changes noted. He is encouraged to avoid adding salt to his foods.   - EKG 12-Lead - POCT Urinalysis Dipstick (81002)  4. Acute pain of left knee  She is advised to apply topical Voltaren gel to affected area two to three times daily. She will let me know if her sx persist. If so, she will consider evaluation at [facility].   [name], MD    THE PATIENT IS ENCOURAGED TO PRACTICE SOCIAL DISTANCING DUE TO THE COVID-19 PANDEMIC.

## 2019-05-20 ENCOUNTER — Telehealth: Payer: Self-pay

## 2019-05-20 ENCOUNTER — Other Ambulatory Visit: Payer: Self-pay

## 2019-05-20 ENCOUNTER — Other Ambulatory Visit (HOSPITAL_COMMUNITY)
Admission: RE | Admit: 2019-05-20 | Discharge: 2019-05-20 | Disposition: A | Discharge status: Home / Self Care | Payer: BC Managed Care – PPO | Source: Ambulatory Visit | Attending: Internal Medicine | Admitting: Internal Medicine

## 2019-05-20 DIAGNOSIS — Z0000 Encounter for general adult medical examination without abnormal findings: Secondary | ICD-10-CM | POA: Diagnosis present

## 2019-05-20 DIAGNOSIS — Z01419 Encounter for gynecological examination (general) (routine) without abnormal findings: Secondary | ICD-10-CM

## 2019-05-20 NOTE — Telephone Encounter (Signed)
I returned the pt's call and gave the pt the information to the [facility]opedic urgent care

## 2019-05-21 ENCOUNTER — Other Ambulatory Visit: Payer: Self-pay | Admitting: Internal Medicine

## 2019-05-21 LAB — CYTOLOGY - PAP: Diagnosis: NEGATIVE

## 2019-05-21 MED ORDER — FLUCONAZOLE 150 MG PO TABS: 150.0000 mg | ORAL_TABLET | Freq: Every day | ORAL | 0 refills | Status: DC

## 2019-05-26 LAB — FUNGUS CULTURE, BLOOD

## 2019-05-27 ENCOUNTER — Other Ambulatory Visit (HOSPITAL_COMMUNITY): Payer: Self-pay | Admitting: Internal Medicine

## 2019-05-27 MED ORDER — KETOCONAZOLE 200 MG PO TABS: 200.0000 mg | ORAL_TABLET | Freq: Every day | ORAL | 2 refills | Status: DC

## 2019-05-27 NOTE — Progress Notes (Signed)
Ketoconazole prescribed for fungal toenail.

## 2019-06-16 ENCOUNTER — Other Ambulatory Visit: Payer: Self-pay

## 2019-06-16 ENCOUNTER — Ambulatory Visit
Admission: RE | Admit: 2019-06-16 | Discharge: 2019-06-16 | Disposition: A | Discharge status: Home / Self Care | Payer: BC Managed Care – PPO | Source: Ambulatory Visit | Attending: Internal Medicine | Admitting: Internal Medicine

## 2019-06-16 DIAGNOSIS — Z1231 Encounter for screening mammogram for malignant neoplasm of breast: Secondary | ICD-10-CM

## 2019-06-23 ENCOUNTER — Telehealth: Payer: Self-pay

## 2019-06-23 NOTE — Telephone Encounter (Signed)
Returned call to pt. Pt went to [facility] urgent [facility] and was not satisfied but is going to go to them to get MRI but wants to get 2nd opinion. Was given number to dr Althea Charon.

## 2019-07-26 ENCOUNTER — Other Ambulatory Visit: Payer: Self-pay | Admitting: Internal Medicine

## 2019-07-27 ENCOUNTER — Other Ambulatory Visit: Payer: Self-pay | Admitting: Internal Medicine

## 2019-10-25 ENCOUNTER — Other Ambulatory Visit: Payer: Self-pay | Admitting: Internal Medicine

## 2019-11-13 ENCOUNTER — Other Ambulatory Visit: Payer: Self-pay

## 2019-11-13 ENCOUNTER — Ambulatory Visit: Payer: BC Managed Care – PPO | Admitting: Internal Medicine

## 2019-11-13 ENCOUNTER — Encounter: Payer: Self-pay | Admitting: Internal Medicine

## 2019-11-13 VITALS — BP 122/84 | HR 63 | Temp 98.3°F | Ht 65.4 in | Wt 152.2 lb

## 2019-11-13 DIAGNOSIS — Z6825 Body mass index (BMI) 25.0-25.9, adult: Secondary | ICD-10-CM | POA: Diagnosis not present

## 2019-11-13 DIAGNOSIS — I10 Essential (primary) hypertension: Secondary | ICD-10-CM | POA: Diagnosis not present

## 2019-11-13 LAB — BMP8+EGFR
BUN/Creatinine Ratio: 20 (ref 12–28)
BUN: 18 mg/dL (ref 8–27)
CO2: 26 mmol/L (ref 20–29)
Calcium: 9.8 mg/dL (ref 8.7–10.3)
Chloride: 99 mmol/L (ref 96–106)
Creatinine, Ser: 0.91 mg/dL (ref 0.57–1.00)
GFR calc Af Amer: 78 mL/min/{1.73_m2} (ref >59)
GFR calc non Af Amer: 67 mL/min/{1.73_m2} (ref >59)
Glucose: 85 mg/dL (ref 65–99)
Potassium: 3.8 mmol/L (ref 3.5–5.2)
Sodium: 141 mmol/L (ref 134–144)

## 2019-11-13 MED ORDER — TRIAMTERENE-HCTZ 37.5-25 MG PO TABS: 1.0000 | ORAL_TABLET | Freq: Every day | ORAL | 2 refills | Status: DC

## 2019-11-13 MED ORDER — LEVOCETIRIZINE DIHYDROCHLORIDE 5 MG PO TABS: ORAL_TABLET | ORAL | 2 refills | Status: DC

## 2019-11-13 NOTE — Progress Notes (Signed)
This visit occurred during the SARS-CoV-2 public health emergency.  Safety protocols were in place, including screening questions prior to the visit, additional usage of staff PPE, and extensive cleaning of exam room while observing appropriate contact time as indicated for disinfecting solutions.  Subjective:     Patient ID: Sierra Glover , female    DOB: 1955-11-11 , 64 y.o.   MRN: [id number]   Chief Complaint  Patient presents with  . Hypertension    HPI  She presents today for BP check. She reports compliance with meds. She has her two grandchildren living with her, ages 52 and 62. Unfortunately, her DIL passed and she has taken over their care.   Hypertension This is a chronic problem. The current episode started more than 1 year ago. The problem has been gradually improving since onset. The problem is controlled. Pertinent negatives include no blurred vision, chest pain, palpitations or shortness of breath. Past treatments include diuretics. The current treatment provides moderate improvement.     Past Medical History:  Diagnosis Date  . HTN (hypertension)      Family History  Problem Relation Age of Onset  . Alzheimer's disease Mother   . Hypertension Mother   . Hypertension Father   . COPD Father   . Hypertension Other      Current Outpatient Medications:  .  ciclopirox (PENLAC) 8 % solution, Apply topically at bedtime. Apply over nail and surrounding skin. Apply daily over previous coat. After seven (7) days, may remove with alcohol and continue cycle., Disp: 6.6 mL, Rfl: 0 .  fluticasone (FLONASE) 50 MCG/ACT nasal spray, SPRAY 1 SPRAY INTO EACH NOSTRIL EVERY DAY, Disp: 48 mL, Rfl: 1 .  levocetirizine (XYZAL) 5 MG tablet, TAKE 1 TABLET BY MOUTH EVERY DAY IN THE EVENING, Disp: 90 tablet, Rfl: 2 .  triamterene-hydrochlorothiazide (MAXZIDE-25) 37.5-25 MG tablet, Take 1 tablet by mouth daily., Disp: 90 tablet, Rfl: 2 .  valACYclovir (VALTREX) 500 MG tablet, take 1  tablet by oral route  every day, Disp: 90 tablet, Rfl: 0   No Known Allergies   Review of Systems  Constitutional: Negative.   Eyes: Negative for blurred vision.  Respiratory: Negative.  Negative for shortness of breath.   Cardiovascular: Negative.  Negative for chest pain and palpitations.  Gastrointestinal: Negative.   Neurological: Negative.   Psychiatric/Behavioral: Negative.      Today's Vitals   11/13/19 0830  BP: 122/84  Pulse: 63  Temp: 98.3 F (36.8 C)  TempSrc: Oral  Weight: 152 lb 3.2 oz (69 kg)  Height: 5' 5.4" (1.661 m)   Body mass index is 25.02 kg/m.   Objective:  Physical Exam Vitals and nursing note reviewed.  Constitutional:      Appearance: Normal appearance.  HENT:     Head: Normocephalic and atraumatic.  Cardiovascular:     Rate and Rhythm: Normal rate and regular rhythm.     Heart sounds: Normal heart sounds.  Pulmonary:     Effort: Pulmonary effort is normal.     Breath sounds: Normal breath sounds.  Skin:    General: Skin is warm.  Neurological:     General: No focal deficit present.     Mental Status: She is alert.  Psychiatric:        Mood and Affect: Mood normal.        Behavior: Behavior normal.         Assessment And Plan:     1. Essential hypertension, benign  Chronic, well  controlled. She will continue with current meds. She will rto in six months for re-evaluation. I will check renal function today.   - BMP8+EGFR  2. Body mass index (BMI) of 25.0-25.9 in adult  Her weight is stable. She is encouraged to exercise no less than 30 minutes five days per week.  [name], MD    THE PATIENT IS ENCOURAGED TO PRACTICE SOCIAL DISTANCING DUE TO THE COVID-19 PANDEMIC.

## 2019-11-13 NOTE — Patient Instructions (Signed)
DASH Eating Plan DASH stands for "Dietary Approaches to Stop Hypertension." The DASH eating plan is a healthy eating plan that has been shown to reduce high blood pressure (hypertension). It may also reduce your risk for type 2 diabetes, heart disease, and stroke. The DASH eating plan may also help with weight loss. What are tips for following this plan?  General guidelines  Avoid eating more than 2,300 mg (milligrams) of salt (sodium) a day. If you have hypertension, you may need to reduce your sodium intake to 1,500 mg a day.  Limit alcohol intake to no more than 1 drink a day for nonpregnant women and 2 drinks a day for men. One drink equals 12 oz of beer, 5 oz of wine, or 1 oz of hard liquor.  Work with your health care provider to maintain a healthy body weight or to lose weight. Ask what an ideal weight is for you.  Get at least 30 minutes of exercise that causes your heart to beat faster (aerobic exercise) most days of the week. Activities may include walking, swimming, or biking.  Work with your health care provider or diet and nutrition specialist (dietitian) to adjust your eating plan to your individual calorie needs. Reading food labels   Check food labels for the amount of sodium per serving. Choose foods with less than 5 percent of the Daily Value of sodium. Generally, foods with less than 300 mg of sodium per serving fit into this eating plan.  To find whole grains, look for the word "whole" as the first word in the ingredient list. Shopping  Buy products labeled as "low-sodium" or "no salt added."  Buy fresh foods. Avoid canned foods and premade or frozen meals. Cooking  Avoid adding salt when cooking. Use salt-free seasonings or herbs instead of table salt or sea salt. Check with your health care provider or pharmacist before using salt substitutes.  Do not fry foods. Cook foods using healthy methods such as baking, boiling, grilling, and broiling instead.  Cook with  heart-healthy oils, such as olive, canola, soybean, or sunflower oil. Meal planning  Eat a balanced diet that includes: ? 5 or more servings of fruits and vegetables each day. At each meal, try to fill half of your plate with fruits and vegetables. ? Up to 6-8 servings of whole grains each day. ? Less than 6 oz of lean meat, poultry, or fish each day. A 3-oz serving of meat is about the same size as a deck of cards. One egg equals 1 oz. ? 2 servings of low-fat dairy each day. ? A serving of nuts, seeds, or beans 5 times each week. ? Heart-healthy fats. Healthy fats called Omega-3 fatty acids are found in foods such as flaxseeds and coldwater fish, like sardines, salmon, and mackerel.  Limit how much you eat of the following: ? Canned or prepackaged foods. ? Food that is high in trans fat, such as fried foods. ? Food that is high in saturated fat, such as fatty meat. ? Sweets, desserts, sugary drinks, and other foods with added sugar. ? Full-fat dairy products.  Do not salt foods before eating.  Try to eat at least 2 vegetarian meals each week.  Eat more home-cooked food and less restaurant, buffet, and fast food.  When eating at a restaurant, ask that your food be prepared with less salt or no salt, if possible. What foods are recommended? The items listed may not be a complete list. Talk with your dietitian about   what dietary choices are best for you. Grains Whole-grain or whole-wheat bread. Whole-grain or whole-wheat pasta. Brown rice. Oatmeal. Quinoa. Bulgur. Whole-grain and low-sodium cereals. Pita bread. Low-fat, low-sodium crackers. Whole-wheat flour tortillas. Vegetables Fresh or frozen vegetables (raw, steamed, roasted, or grilled). Low-sodium or reduced-sodium tomato and vegetable juice. Low-sodium or reduced-sodium tomato sauce and tomato paste. Low-sodium or reduced-sodium canned vegetables. Fruits All fresh, dried, or frozen fruit. Canned fruit in natural juice (without  added sugar). Meat and other protein foods Skinless chicken or turkey. Ground chicken or turkey. Pork with fat trimmed off. Fish and seafood. Egg whites. Dried beans, peas, or lentils. Unsalted nuts, nut butters, and seeds. Unsalted canned beans. Lean cuts of beef with fat trimmed off. Low-sodium, lean deli meat. Dairy Low-fat (1%) or fat-free (skim) milk. Fat-free, low-fat, or reduced-fat cheeses. Nonfat, low-sodium ricotta or cottage cheese. Low-fat or nonfat yogurt. Low-fat, low-sodium cheese. Fats and oils Soft margarine without trans fats. Vegetable oil. Low-fat, reduced-fat, or light mayonnaise and salad dressings (reduced-sodium). Canola, safflower, olive, soybean, and sunflower oils. Avocado. Seasoning and other foods Herbs. Spices. Seasoning mixes without salt. Unsalted popcorn and pretzels. Fat-free sweets. What foods are not recommended? The items listed may not be a complete list. Talk with your dietitian about what dietary choices are best for you. Grains Baked goods made with fat, such as croissants, muffins, or some breads. Dry pasta or rice meal packs. Vegetables Creamed or fried vegetables. Vegetables in a cheese sauce. Regular canned vegetables (not low-sodium or reduced-sodium). Regular canned tomato sauce and paste (not low-sodium or reduced-sodium). Regular tomato and vegetable juice (not low-sodium or reduced-sodium). Pickles. Olives. Fruits Canned fruit in a light or heavy syrup. Fried fruit. Fruit in cream or butter sauce. Meat and other protein foods Fatty cuts of meat. Ribs. Fried meat. Bacon. Sausage. Bologna and other processed lunch meats. Salami. Fatback. Hotdogs. Bratwurst. Salted nuts and seeds. Canned beans with added salt. Canned or smoked fish. Whole eggs or egg yolks. Chicken or turkey with skin. Dairy Whole or 2% milk, cream, and half-and-half. Whole or full-fat cream cheese. Whole-fat or sweetened yogurt. Full-fat cheese. Nondairy creamers. Whipped toppings.  Processed cheese and cheese spreads. Fats and oils Butter. Stick margarine. Lard. Shortening. Ghee. Bacon fat. Tropical oils, such as coconut, palm kernel, or palm oil. Seasoning and other foods Salted popcorn and pretzels. Onion salt, garlic salt, seasoned salt, table salt, and sea salt. Worcestershire sauce. Tartar sauce. Barbecue sauce. Teriyaki sauce. Soy sauce, including reduced-sodium. Steak sauce. Canned and packaged gravies. Fish sauce. Oyster sauce. Cocktail sauce. Horseradish that you find on the shelf. Ketchup. Mustard. Meat flavorings and tenderizers. Bouillon cubes. Hot sauce and Tabasco sauce. Premade or packaged marinades. Premade or packaged taco seasonings. Relishes. Regular salad dressings. Where to find more information:  National Heart, Lung, and Blood Institute: www.nhlbi.nih.gov  American Heart Association: www.heart.org Summary  The DASH eating plan is a healthy eating plan that has been shown to reduce high blood pressure (hypertension). It may also reduce your risk for type 2 diabetes, heart disease, and stroke.  With the DASH eating plan, you should limit salt (sodium) intake to 2,300 mg a day. If you have hypertension, you may need to reduce your sodium intake to 1,500 mg a day.  When on the DASH eating plan, aim to eat more fresh fruits and vegetables, whole grains, lean proteins, low-fat dairy, and heart-healthy fats.  Work with your health care provider or diet and nutrition specialist (dietitian) to adjust your eating plan to your   individual calorie needs. This information is not intended to replace advice given to you by your health care provider. Make sure you discuss any questions you have with your health care provider. Document Revised: 05/18/2017 Document Reviewed: 05/29/2016 Elsevier Patient Education  2020 [employer] Inc.  

## 2020-02-17 ENCOUNTER — Encounter: Payer: Self-pay | Admitting: Internal Medicine

## 2020-03-25 ENCOUNTER — Telehealth: Payer: Self-pay

## 2020-03-25 ENCOUNTER — Other Ambulatory Visit: Payer: Self-pay

## 2020-03-25 ENCOUNTER — Ambulatory Visit: Payer: BC Managed Care – PPO | Admitting: Internal Medicine

## 2020-03-25 ENCOUNTER — Encounter: Payer: Self-pay | Admitting: Internal Medicine

## 2020-03-25 VITALS — BP 122/78 | HR 94 | Temp 98.5°F | Ht 65.4 in | Wt 153.0 lb

## 2020-03-25 DIAGNOSIS — R350 Frequency of micturition: Secondary | ICD-10-CM

## 2020-03-25 DIAGNOSIS — I10 Essential (primary) hypertension: Secondary | ICD-10-CM

## 2020-03-25 LAB — POCT URINALYSIS DIPSTICK
Bilirubin, UA: NEGATIVE
Blood, UA: NEGATIVE
Glucose, UA: NEGATIVE
Ketones, UA: NEGATIVE
Nitrite, UA: NEGATIVE
Protein, UA: NEGATIVE
Spec Grav, UA: 1.02 (ref 1.010–1.025)
Urobilinogen, UA: 0.2 E.U./dL
pH, UA: 7.5 (ref 5.0–8.0)

## 2020-03-25 MED ORDER — LEVOCETIRIZINE DIHYDROCHLORIDE 5 MG PO TABS: ORAL_TABLET | ORAL | 2 refills | Status: DC

## 2020-03-25 MED ORDER — PHENAZOPYRIDINE HCL 100 MG PO TABS: 100.0000 mg | ORAL_TABLET | Freq: Three times a day (TID) | ORAL | 0 refills | Status: DC | PRN

## 2020-03-25 NOTE — Telephone Encounter (Signed)
Refill medication

## 2020-03-25 NOTE — Progress Notes (Signed)
Tomasa Hose as a scribe for [name], MD.,have documented all relevant documentation on the behalf of [name], MD,as directed by  [name], MD while in the presence of [name], MD. This visit occurred during the SARS-CoV-2 public health emergency.  Safety protocols were in place, including screening questions prior to the visit, additional usage of staff PPE, and extensive cleaning of exam room while observing appropriate contact time as indicated for disinfecting solutions.  Subjective:     Patient ID: Sierra Glover , female    DOB: [birth year] , 64 y.o.   MRN: [id number]   Chief Complaint  Patient presents with  . Urinary Tract Infection    HPI  Pt is here today for UTI symptoms. She states her urine has odor and c/o frequency. Denies having vaginal discharge. No abdominal sx.    Past Medical History:  Diagnosis Date  . HTN (hypertension)      Family History  Problem Relation Age of Onset  . Alzheimer's disease Mother   . Hypertension Mother   . Hypertension Father   . COPD Father   . Hypertension Other      Current Outpatient Medications:  .  ciclopirox (PENLAC) 8 % solution, Apply topically at bedtime. Apply over nail and surrounding skin. Apply daily over previous coat. After seven (7) days, may remove with alcohol and continue cycle., Disp: 6.6 mL, Rfl: 0 .  fluticasone (FLONASE) 50 MCG/ACT nasal spray, SPRAY 1 SPRAY INTO EACH NOSTRIL EVERY DAY, Disp: 48 mL, Rfl: 1 .  triamterene-hydrochlorothiazide (MAXZIDE-25) 37.5-25 MG tablet, Take 1 tablet by mouth daily., Disp: 90 tablet, Rfl: 2 .  valACYclovir (VALTREX) 500 MG tablet, take 1 tablet by oral route  every day, Disp: 90 tablet, Rfl: 0 .  levocetirizine (XYZAL) 5 MG tablet, TAKE 1 TABLET BY MOUTH EVERY DAY IN THE EVENING, Disp: 90 tablet, Rfl: 2 .  phenazopyridine (PYRIDIUM) 100 MG tablet, Take 1 tablet (100 mg total) by mouth 3 (three) times daily as needed for pain., Disp:  6 tablet, Rfl: 0   No Known Allergies   Review of Systems  Constitutional: Negative.   Respiratory: Negative.   Genitourinary: Negative for vaginal discharge and vaginal pain.  Musculoskeletal: Negative.   Neurological: Negative.   Psychiatric/Behavioral: Negative.      Today's Vitals   03/25/20 1024  BP: 122/78  Pulse: 94  Temp: 98.5 F (36.9 C)  TempSrc: Oral  Weight: 153 lb (69.4 kg)  Height: 5' 5.4" (1.661 m)   Body mass index is 25.15 kg/m.   Objective:  Physical Exam Vitals and nursing note reviewed.  Constitutional:      Appearance: Normal appearance.  HENT:     Head: Normocephalic and atraumatic.  Cardiovascular:     Rate and Rhythm: Normal rate and regular rhythm.     Heart sounds: Normal heart sounds.  Pulmonary:     Effort: Pulmonary effort is normal.     Breath sounds: Normal breath sounds.  Abdominal:     General: Bowel sounds are normal.     Palpations: Abdomen is soft.  Skin:    General: Skin is warm.  Neurological:     General: No focal deficit present.     Mental Status: She is alert.  Psychiatric:        Mood and Affect: Mood normal.        Behavior: Behavior normal.         Assessment And Plan:  1. Urinary frequency Comments: Urinalysis performed, neg. She was given rx pyridium, advised this should help with pelvic pressure.  - POCT Urinalysis Dipstick (81002)  2. Essential hypertension, benign Comments: Chronic, well controlled. She will continue with current meds.      Patient was given opportunity to ask questions. Patient verbalized understanding of the plan and was able to repeat key elements of the plan. All questions were answered to their satisfaction.  [name], MD   I, [name], MD, have reviewed all documentation for this visit. The documentation on 04/04/20 for the exam, diagnosis, procedures, and orders are all accurate and complete.  THE PATIENT IS ENCOURAGED TO PRACTICE SOCIAL DISTANCING DUE TO THE  COVID-19 PANDEMIC.

## 2020-05-24 ENCOUNTER — Other Ambulatory Visit: Payer: Self-pay | Admitting: Internal Medicine

## 2020-05-24 DIAGNOSIS — Z1231 Encounter for screening mammogram for malignant neoplasm of breast: Secondary | ICD-10-CM

## 2020-05-31 ENCOUNTER — Ambulatory Visit: Payer: BC Managed Care – PPO | Admitting: Internal Medicine

## 2020-05-31 ENCOUNTER — Other Ambulatory Visit: Payer: Self-pay

## 2020-05-31 ENCOUNTER — Encounter: Payer: Self-pay | Admitting: Internal Medicine

## 2020-05-31 VITALS — BP 130/76 | HR 67 | Temp 98.0°F | Ht 64.2 in | Wt 151.8 lb

## 2020-05-31 DIAGNOSIS — Z6825 Body mass index (BMI) 25.0-25.9, adult: Secondary | ICD-10-CM

## 2020-05-31 DIAGNOSIS — I10 Essential (primary) hypertension: Secondary | ICD-10-CM | POA: Diagnosis not present

## 2020-05-31 DIAGNOSIS — N760 Acute vaginitis: Secondary | ICD-10-CM | POA: Diagnosis not present

## 2020-05-31 DIAGNOSIS — R8290 Unspecified abnormal findings in urine: Secondary | ICD-10-CM

## 2020-05-31 DIAGNOSIS — J302 Other seasonal allergic rhinitis: Secondary | ICD-10-CM

## 2020-05-31 DIAGNOSIS — Z0000 Encounter for general adult medical examination without abnormal findings: Secondary | ICD-10-CM

## 2020-05-31 LAB — POCT URINALYSIS DIPSTICK
Bilirubin, UA: NEGATIVE
Blood, UA: NEGATIVE
Glucose, UA: NEGATIVE
Ketones, UA: NEGATIVE
Leukocytes, UA: NEGATIVE
Nitrite, UA: NEGATIVE
Protein, UA: NEGATIVE
Spec Grav, UA: 1.02 (ref 1.010–1.025)
Urobilinogen, UA: 0.2 E.U./dL
pH, UA: 7 (ref 5.0–8.0)

## 2020-05-31 LAB — POCT UA - MICROALBUMIN
Albumin/Creatinine Ratio, Urine, POC: <30
Creatinine, POC: 200 mg/dL
Microalbumin Ur, POC: 30 mg/L

## 2020-05-31 MED ORDER — FEXOFENADINE HCL 180 MG PO TABS: 180.0000 mg | ORAL_TABLET | Freq: Every day | ORAL | 1 refills | Status: DC

## 2020-05-31 MED ORDER — TRIAMTERENE-HCTZ 37.5-25 MG PO TABS: 1.0000 | ORAL_TABLET | Freq: Every day | ORAL | 2 refills | Status: DC

## 2020-05-31 NOTE — Progress Notes (Signed)
I,Katawbba Wiggins,acting as a Education administrator for [name], MD.,have documented all relevant documentation on the behalf of [name], MD,as directed by  [name], MD while in the presence of [name], MD.  This visit occurred during the SARS-CoV-2 public health emergency.  Safety protocols were in place, including screening questions prior to the visit, additional usage of staff PPE, and extensive cleaning of exam room while observing appropriate contact time as indicated for disinfecting solutions.  Subjective:     Patient ID: Sierra Glover , female    DOB: [birth year] , 63 y.o.   MRN: [id number]   Chief Complaint  Patient presents with  . Annual Exam  . Hypertension    HPI  She is here today for a full physical examination.  Her last pap smear was in December 2020. She c/o stale odor in her urine. Admits she only drinks 32 ounces of water per day. Denies dysuria. Denies recent sexual activity. She reports her sx worsen when there are long intervals in between her urination. For example, she doesn't smell the odor if she uses bathroom frequently. However, if she waits several hours (3 or more) in between urinating, the smell is apparent. She denies eating asparagus. She has not been able to identify any triggers.   Hypertension This is a chronic problem. The current episode started more than 1 year ago. The problem has been gradually improving since onset. The problem is controlled. Pertinent negatives include no blurred vision, chest pain, palpitations or shortness of breath. Past treatments include diuretics. The current treatment provides moderate improvement.     Past Medical History:  Diagnosis Date  . HTN (hypertension)      Family History  Problem Relation Age of Onset  . Alzheimer's disease Mother   . Hypertension Mother   . Hypertension Father   . COPD Father   . Hypertension Other      Current Outpatient Medications:  .  ciclopirox (PENLAC) 8 %  solution, Apply topically at bedtime. Apply over nail and surrounding skin. Apply daily over previous coat. After seven (7) days, may remove with alcohol and continue cycle., Disp: 6.6 mL, Rfl: 0 .  fluticasone (FLONASE) 50 MCG/ACT nasal spray, SPRAY 1 SPRAY INTO EACH NOSTRIL EVERY DAY, Disp: 48 mL, Rfl: 1 .  valACYclovir (VALTREX) 500 MG tablet, take 1 tablet by oral route  every day, Disp: 90 tablet, Rfl: 0 .  fexofenadine (ALLEGRA ALLERGY) 180 MG tablet, Take 1 tablet (180 mg total) by mouth daily., Disp: 90 tablet, Rfl: 1 .  fluconazole (DIFLUCAN) 150 MG tablet, One tab po today, repeat in 2 days, Disp: 2 tablet, Rfl: 0 .  triamterene-hydrochlorothiazide (MAXZIDE-25) 37.5-25 MG tablet, Take 1 tablet by mouth daily., Disp: 90 tablet, Rfl: 2   No Known Allergies    The patient states she uses none for birth control. Last LMP was No LMP recorded. Patient has had a hysterectomy.. Negative for Dysmenorrhea. Negative for: breast discharge, breast lump(s), breast pain and breast self exam. Associated symptoms include abnormal vaginal bleeding. Pertinent negatives include abnormal bleeding (hematology), anxiety, decreased libido, depression, difficulty falling sleep, dyspareunia, history of infertility, nocturia, sexual dysfunction, sleep disturbances, urinary incontinence, urinary urgency, vaginal discharge and vaginal itching. Diet regular.The patient states her exercise level is  intermittent.   . The patient's tobacco use is:   Social History   Tobacco Use  Smoking Status Never Smoker  Smokeless Tobacco Never Used  . She has been exposed to  passive smoke. The patient's alcohol use is:  Social History   Substance and Sexual Activity  Alcohol Use Yes   Comment: social    Review of Systems  Constitutional: Negative.   HENT: Positive for postnasal drip.   Eyes: Negative.  Negative for blurred vision.  Respiratory: Negative.  Negative for shortness of breath.   Cardiovascular: Negative.   Negative for chest pain and palpitations.  Gastrointestinal: Negative.   Endocrine: Negative.   Genitourinary: Negative.  Negative for dysuria, flank pain and vaginal discharge.  Musculoskeletal: Negative.   Skin: Negative.   Allergic/Immunologic: Negative.   Neurological: Negative.   Hematological: Negative.   Psychiatric/Behavioral: Negative.      Today's Vitals   05/31/20 1025  BP: 130/76  Pulse: 67  Temp: 98 F (36.7 C)  TempSrc: Oral  Weight: 151 lb 12.8 oz (68.9 kg)  Height: 5' 4.2" (1.631 m)   Body mass index is 25.89 kg/m.  Wt Readings from Last 3 Encounters:  05/31/20 151 lb 12.8 oz (68.9 kg)  03/25/20 153 lb (69.4 kg)  11/13/19 152 lb 3.2 oz (69 kg)   Objective:  Physical Exam Exam conducted with a chaperone present.  Constitutional:      General: She is not in acute distress.    Appearance: Normal appearance. She is well-developed.  HENT:     Head: Normocephalic and atraumatic.     Right Ear: Hearing, tympanic membrane, ear canal and external ear normal. There is no impacted cerumen.     Left Ear: Hearing, tympanic membrane, ear canal and external ear normal. There is no impacted cerumen.     Nose:     Comments: Deferred, masked    Mouth/Throat:     Comments: Deferred, masked Eyes:     General: Lids are normal.     Extraocular Movements: Extraocular movements intact.     Conjunctiva/sclera: Conjunctivae normal.     Pupils: Pupils are equal, round, and reactive to light.     Funduscopic exam:    Right eye: No papilledema.        Left eye: No papilledema.  Neck:     Thyroid: No thyroid mass.     Vascular: No carotid bruit.  Cardiovascular:     Rate and Rhythm: Normal rate and regular rhythm.     Pulses: Normal pulses.     Heart sounds: Normal heart sounds. No murmur heard.   Pulmonary:     Effort: Pulmonary effort is normal.     Breath sounds: Normal breath sounds.  Chest:  Breasts:     Tanner Score is 5.     Right: Normal.     Left: Normal.     Abdominal:     General: Abdomen is flat. Bowel sounds are normal. There is no distension.     Palpations: Abdomen is soft.     Tenderness: There is no abdominal tenderness.     Hernia: There is no hernia in the left inguinal area or right inguinal area.  Genitourinary:    General: Normal vulva.     Exam position: Lithotomy position.     Labia:        Right: No rash.        Left: No rash.      Vagina: Vaginal discharge present.     Comments: Thin, white d/c Musculoskeletal:        General: No swelling. Normal range of motion.     Cervical back: Full passive range of motion without pain, normal  range of motion and neck supple.     Right lower leg: No edema.     Left lower leg: No edema.  Lymphadenopathy:     Lower Body: No right inguinal adenopathy. No left inguinal adenopathy.  Skin:    General: Skin is warm and dry.     Capillary Refill: Capillary refill takes less than 2 seconds.  Neurological:     General: No focal deficit present.     Mental Status: She is alert and oriented to person, place, and time.     Cranial Nerves: No cranial nerve deficit.     Sensory: No sensory deficit.  Psychiatric:        Mood and Affect: Mood normal.        Behavior: Behavior normal.        Thought Content: Thought content normal.        Judgment: Judgment normal.         Assessment And Plan:     1. Routine general medical examination at health care facility Comments: A full exam was performed. Importance of monthly self breast exams was discussed with the patient. PATIENT IS ADVISED TO GET 30-45 MINUTES REGULAR EXERCISE NO LESS THAN FOUR TO FIVE DAYS PER WEEK - BOTH WEIGHTBEARING EXERCISES AND AEROBIC ARE RECOMMENDED.  PATIENT IS ADVISED TO FOLLOW A HEALTHY DIET WITH AT LEAST SIX FRUITS/VEGGIES PER DAY, DECREASE INTAKE OF RED MEAT, AND TO INCREASE FISH INTAKE TO TWO DAYS PER WEEK.  MEATS/FISH SHOULD NOT BE FRIED, BAKED OR BROILED IS PREFERABLE.  I SUGGEST WEARING SPF 50 SUNSCREEN ON  EXPOSED PARTS AND ESPECIALLY WHEN IN THE DIRECT SUNLIGHT FOR AN EXTENDED PERIOD OF TIME.  PLEASE AVOID FAST FOOD RESTAURANTS AND INCREASE YOUR WATER INTAKE. - CBC - CMP14+EGFR - Lipid panel - TSH  2. Essential hypertension, benign Comments: Chronic, controlled. She will continue with current meds. She is encouraged to avoid adding salt to her foods. EKG performed, NSR w/ nonspecific T wave abnormality. She will f/u in six months for re-evaluation.  - POCT Urinalysis Dipstick (81002) - POCT UA - Microalbumin - EKG 12-Lead  3. Malodorous urine Comments: I will check urinalysis today.  I will send urine off for yeast/BV.  4. Acute vaginitis Comments: I will send off Nuswab for yeast/BV. - NuSwab VG, Candida 6sp  5. Seasonal allergic rhinitis, unspecified trigger Comments: I wil send rx fexofenadine 139m daily. She will let me know if her sx persist.   6. Body mass index (BMI) of 25.0-25.9 in adult She is encouraged to strive for BMI less than 25 to decrease cardiac risk. Advised to aim for at least 150 minutes of exercise per week.  Patient was given opportunity to ask questions. Patient verbalized understanding of the plan and was able to repeat key elements of the plan. All questions were answered to their satisfaction.  RMaximino Greenland MD   I, RMaximino Greenland MD, have reviewed all documentation for this visit. The documentation on 06/07/20 for the exam, diagnosis, procedures, and orders are all accurate and complete.  THE PATIENT IS ENCOURAGED TO PRACTICE SOCIAL DISTANCING DUE TO THE COVID-19 PANDEMIC.

## 2020-05-31 NOTE — Patient Instructions (Signed)
Health Maintenance, Female Adopting a healthy lifestyle and getting preventive care are important in promoting health and wellness. Ask your health care provider about:  The right schedule for you to have regular tests and exams.  Things you can do on your own to prevent diseases and keep yourself healthy. What should I know about diet, weight, and exercise? Eat a healthy diet   Eat a diet that includes plenty of vegetables, fruits, low-fat dairy products, and lean protein.  Do not eat a lot of foods that are high in solid fats, added sugars, or sodium. Maintain a healthy weight Body mass index (BMI) is used to identify weight problems. It estimates body fat based on height and weight. Your health care provider can help determine your BMI and help you achieve or maintain a healthy weight. Get regular exercise Get regular exercise. This is one of the most important things you can do for your health. Most adults should:  Exercise for at least 150 minutes each week. The exercise should increase your heart rate and make you sweat (moderate-intensity exercise).  Do strengthening exercises at least twice a week. This is in addition to the moderate-intensity exercise.  Spend less time sitting. Even light physical activity can be beneficial. Watch cholesterol and blood lipids Have your blood tested for lipids and cholesterol at 64 years of age, then have this test every 5 years. Have your cholesterol levels checked more often if:  Your lipid or cholesterol levels are high.  You are older than 64 years of age.  You are at high risk for heart disease. What should I know about cancer screening? Depending on your health history and family history, you may need to have cancer screening at various ages. This may include screening for:  Breast cancer.  Cervical cancer.  Colorectal cancer.  Skin cancer.  Lung cancer. What should I know about heart disease, diabetes, and high blood  pressure? Blood pressure and heart disease  High blood pressure causes heart disease and increases the risk of stroke. This is more likely to develop in people who have high blood pressure readings, are of African descent, or are overweight.  Have your blood pressure checked: ? Every 3-5 years if you are 18-39 years of age. ? Every year if you are 40 years old or older. Diabetes Have regular diabetes screenings. This checks your fasting blood sugar level. Have the screening done:  Once every three years after age 40 if you are at a normal weight and have a low risk for diabetes.  More often and at a younger age if you are overweight or have a high risk for diabetes. What should I know about preventing infection? Hepatitis B If you have a higher risk for hepatitis B, you should be screened for this virus. Talk with your health care provider to find out if you are at risk for hepatitis B infection. Hepatitis C Testing is recommended for:  Everyone born from 1945 through 1965.  Anyone with known risk factors for hepatitis C. Sexually transmitted infections (STIs)  Get screened for STIs, including gonorrhea and chlamydia, if: ? You are sexually active and are younger than 64 years of age. ? You are older than 64 years of age and your health care provider tells you that you are at risk for this type of infection. ? Your sexual activity has changed since you were last screened, and you are at increased risk for chlamydia or gonorrhea. Ask your health care provider if   you are at risk.  Ask your health care provider about whether you are at high risk for HIV. Your health care provider may recommend a prescription medicine to help prevent HIV infection. If you choose to take medicine to prevent HIV, you should first get tested for HIV. You should then be tested every 3 months for as long as you are taking the medicine. Pregnancy  If you are about to stop having your period (premenopausal) and  you may become pregnant, seek counseling before you get pregnant.  Take 400 to 800 micrograms (mcg) of folic acid every day if you become pregnant.  Ask for birth control (contraception) if you want to prevent pregnancy. Osteoporosis and menopause Osteoporosis is a disease in which the bones lose minerals and strength with aging. This can result in bone fractures. If you are 65 years old or older, or if you are at risk for osteoporosis and fractures, ask your health care provider if you should:  Be screened for bone loss.  Take a calcium or vitamin D supplement to lower your risk of fractures.  Be given hormone replacement therapy (HRT) to treat symptoms of menopause. Follow these instructions at home: Lifestyle  Do not use any products that contain nicotine or tobacco, such as cigarettes, e-cigarettes, and chewing tobacco. If you need help quitting, ask your health care provider.  Do not use street drugs.  Do not share needles.  Ask your health care provider for help if you need support or information about quitting drugs. Alcohol use  Do not drink alcohol if: ? Your health care provider tells you not to drink. ? You are pregnant, may be pregnant, or are planning to become pregnant.  If you drink alcohol: ? Limit how much you use to 0-1 drink a day. ? Limit intake if you are breastfeeding.  Be aware of how much alcohol is in your drink. In the [place] one drink equals one 12 oz bottle of beer (355 mL), one 5 oz glass of wine (148 mL), or one 1 oz glass of hard liquor (44 mL). General instructions  Schedule regular health, dental, and eye exams.  Stay current with your vaccines.  Tell your health care provider if: ? You often feel depressed. ? You have ever been abused or do not feel safe at home. Summary  Adopting a healthy lifestyle and getting preventive care are important in promoting health and wellness.  Follow your health care provider's instructions about healthy  diet, exercising, and getting tested or screened for diseases.  Follow your health care provider's instructions on monitoring your cholesterol and blood pressure. This information is not intended to replace advice given to you by your health care provider. Make sure you discuss any questions you have with your health care provider. Document Revised: 05/29/2018 Document Reviewed: 05/29/2018 Elsevier Patient Education  2020 [employer]  

## 2020-06-01 LAB — CMP14+EGFR
ALT: 21 IU/L (ref 0–32)
AST: 19 IU/L (ref 0–40)
Albumin/Globulin Ratio: 1.6 (ref 1.2–2.2)
Albumin: 4.3 g/dL (ref 3.8–4.8)
Alkaline Phosphatase: 89 IU/L (ref 44–121)
BUN/Creatinine Ratio: 11 — ABNORMAL LOW (ref 12–28)
BUN: 15 mg/dL (ref 8–27)
Bilirubin Total: 0.4 mg/dL (ref 0.0–1.2)
CO2: 25 mmol/L (ref 20–29)
Calcium: 9.5 mg/dL (ref 8.7–10.3)
Chloride: 104 mmol/L (ref 96–106)
Creatinine, Ser: 1.37 mg/dL — ABNORMAL HIGH (ref 0.57–1.00)
GFR calc Af Amer: 47 mL/min/{1.73_m2} — ABNORMAL LOW (ref >59)
GFR calc non Af Amer: 41 mL/min/{1.73_m2} — ABNORMAL LOW (ref >59)
Globulin, Total: 2.7 g/dL (ref 1.5–4.5)
Glucose: 94 mg/dL (ref 65–99)
Potassium: 4.8 mmol/L (ref 3.5–5.2)
Sodium: 141 mmol/L (ref 134–144)
Total Protein: 7 g/dL (ref 6.0–8.5)

## 2020-06-01 LAB — LIPID PANEL
Chol/HDL Ratio: 3.5 ratio (ref 0.0–4.4)
Cholesterol, Total: 216 mg/dL — ABNORMAL HIGH (ref 100–199)
HDL: 61 mg/dL (ref >39)
LDL Chol Calc (NIH): 134 mg/dL — ABNORMAL HIGH (ref 0–99)
Triglycerides: 121 mg/dL (ref 0–149)
VLDL Cholesterol Cal: 21 mg/dL (ref 5–40)

## 2020-06-01 LAB — TSH: TSH: 1.05 u[IU]/mL (ref 0.450–4.500)

## 2020-06-01 LAB — CBC
Hematocrit: 44.9 % (ref 34.0–46.6)
Hemoglobin: 14.8 g/dL (ref 11.1–15.9)
MCH: 28.6 pg (ref 26.6–33.0)
MCHC: 33 g/dL (ref 31.5–35.7)
MCV: 87 fL (ref 79–97)
Platelets: 309 10*3/uL (ref 150–450)
RBC: 5.17 x10E6/uL (ref 3.77–5.28)
RDW: 12.1 % (ref 11.7–15.4)
WBC: 4.2 10*3/uL (ref 3.4–10.8)

## 2020-06-03 ENCOUNTER — Other Ambulatory Visit: Payer: Self-pay | Admitting: Internal Medicine

## 2020-06-03 LAB — NUSWAB VG, CANDIDA 6SP
C PARAPSILOSIS/TROPICALIS: NEGATIVE
Candida albicans, NAA: NEGATIVE
Candida glabrata, NAA: POSITIVE — AB
Candida krusei, NAA: NEGATIVE
Candida lusitaniae, NAA: NEGATIVE
Trich vag by NAA: NEGATIVE

## 2020-06-03 MED ORDER — FLUCONAZOLE 150 MG PO TABS: ORAL_TABLET | ORAL | 0 refills | Status: DC

## 2020-06-30 ENCOUNTER — Ambulatory Visit: Payer: BC Managed Care – PPO

## 2020-07-02 ENCOUNTER — Other Ambulatory Visit: Payer: Self-pay

## 2020-07-02 ENCOUNTER — Ambulatory Visit
Admission: RE | Admit: 2020-07-02 | Discharge: 2020-07-02 | Disposition: A | Discharge status: Home / Self Care | Payer: BC Managed Care – PPO | Source: Ambulatory Visit | Attending: Internal Medicine | Admitting: Internal Medicine

## 2020-07-02 DIAGNOSIS — Z1231 Encounter for screening mammogram for malignant neoplasm of breast: Secondary | ICD-10-CM

## 2020-11-09 ENCOUNTER — Telehealth: Payer: BC Managed Care – PPO | Admitting: Nurse Practitioner

## 2020-11-09 ENCOUNTER — Encounter: Payer: Self-pay | Admitting: Nurse Practitioner

## 2020-11-09 ENCOUNTER — Telehealth (INDEPENDENT_AMBULATORY_CARE_PROVIDER_SITE_OTHER): Payer: BC Managed Care – PPO | Admitting: Nurse Practitioner

## 2020-11-09 VITALS — BP 135/79 | HR 72 | Temp 98.1°F | Ht 64.2 in | Wt 143.0 lb

## 2020-11-09 DIAGNOSIS — W57XXXA Bitten or stung by nonvenomous insect and other nonvenomous arthropods, initial encounter: Secondary | ICD-10-CM

## 2020-11-09 DIAGNOSIS — S40861A Insect bite (nonvenomous) of right upper arm, initial encounter: Secondary | ICD-10-CM

## 2020-11-09 DIAGNOSIS — R42 Dizziness and giddiness: Secondary | ICD-10-CM

## 2020-11-09 MED ORDER — PREDNISONE 10 MG PO TABS: 10.0000 mg | ORAL_TABLET | Freq: Every day | ORAL | 0 refills | Status: DC

## 2020-11-09 MED ORDER — MECLIZINE HCL 25 MG PO TABS: 25.0000 mg | ORAL_TABLET | Freq: Two times a day (BID) | ORAL | 0 refills | Status: DC | PRN

## 2020-11-09 NOTE — Patient Instructions (Signed)

## 2020-11-09 NOTE — Progress Notes (Signed)
Virtual Visit via video visit.    This visit type was conducted due to national recommendations for restrictions regarding the COVID-19 Pandemic (e.g. social distancing) in an effort to limit this patient's exposure and mitigate transmission in our community.  Due to her co-morbid illnesses, this patient is at least at moderate risk for complications without adequate follow up.  This format is felt to be most appropriate for this patient at this time.  All issues noted in this document were discussed and addressed.  A limited physical exam was performed with this format.    This visit type was conducted due to national recommendations for restrictions regarding the COVID-19 Pandemic (e.g. social distancing) in an effort to limit this patient's exposure and mitigate transmission in our community.  Patients identity confirmed using two different identifiers.  This format is felt to be most appropriate for this patient at this time.  All issues noted in this document were discussed and addressed.  No physical exam was performed (except for noted visual exam findings with Video Visits).    Date:  11/09/2020   ID:  Sierra Glover, DOB 1956-05-29, MRN [id number]  Patient Location:  Home   Provider location:   Office    Chief Complaint:  Insect bites   History of Present Illness:    Sierra Glover is a 65 y.o. female who presents via video conferencing for a telehealth visit today.   The patient does not have symptoms concerning for COVID-19 infection (fever, chills, cough, or new shortness of breath).   She woke up with mosquito bites. They are on her right arms, elbow area. It itches. There are 2 spot on her right warm that are raised. She was outside on Saturday. She saw these on sunday morning. No fever. She has not taken benadryl. The bites itch. She has tried some hydrocortisone cream on to help with the itching. The patient also has another complaint of vertigo that she has been  dealing with years. She would like to see the cause of it and wants a referral to  ENT.      Past Medical History:  Diagnosis Date  . HTN (hypertension)    Past Surgical History:  Procedure Laterality Date  . partal hysterectomy  2009     No outpatient medications have been marked as taking for the 11/09/20 encounter (Video Visit) with Charlesetta Ivory, NP.     Allergies:   Patient has no known allergies.   Social History   Tobacco Use  . Smoking status: Never Smoker  . Smokeless tobacco: Never Used  Vaping Use  . Vaping Use: Never used  Substance Use Topics  . Alcohol use: Yes    Comment: social  . Drug use: Never     Family Hx: The patient's family history includes Alzheimer's disease in her mother; COPD in her father; Hypertension in her father, mother, and another family member.  ROS:   Please see the history of present illness.    Review of Systems  Constitutional: Negative for chills and fever.  HENT: Negative for congestion and sinus pain.   Respiratory: Negative for cough and sputum production.   Cardiovascular: Negative for chest pain.  Gastrointestinal: Negative for constipation, diarrhea, nausea and vomiting.  Musculoskeletal: Negative for back pain and myalgias.  Skin: Positive for itching and rash.  Neurological: Negative for dizziness and headaches.       Hx of vertigo     All other systems reviewed and are negative.  Labs/Other Tests and Data Reviewed:    Recent Labs: 05/31/2020: ALT 21; BUN 15; Creatinine, Ser 1.37; Hemoglobin 14.8; Platelets 309; Potassium 4.8; Sodium 141; TSH 1.050   Recent Lipid Panel Lab Results  Component Value Date/Time   CHOL 216 (H) 05/31/2020 03:22 PM   TRIG 121 05/31/2020 03:22 PM   HDL 61 05/31/2020 03:22 PM   CHOLHDL 3.5 05/31/2020 03:22 PM   LDLCALC 134 (H) 05/31/2020 03:22 PM    Wt Readings from Last 3 Encounters:  11/09/20 143 lb (64.9 kg)  05/31/20 151 lb 12.8 oz (68.9 kg)  03/25/20 153 lb (69.4 kg)      Exam:    Vital Signs:  BP 135/79   Pulse 72   Temp 98.1 F (36.7 C) (Oral)   Ht 5' 4.2" (1.631 m)   Wt 143 lb (64.9 kg)   BMI 24.39 kg/m     Physical Exam Vitals and nursing note reviewed.  HENT:     Head: Normocephalic and atraumatic.  Pulmonary:     Effort: Pulmonary effort is normal.  Skin:    General: Skin is warm and dry.     Findings: Rash present.     Comments: 2 raised areas red areas on right arm visualized through televisit.   Neurological:     Mental Status: She is alert and oriented to person, place, and time.  Psychiatric:        Mood and Affect: Affect normal.     ASSESSMENT & PLAN:    1)  Insect bite of right upper extremity, initial encounter  -2 reddened areas on the right arm around the elbow inner area.  -Will send in prescription for prednisone. -Advised patient to use OTC hydrocortisone anti itch cream as needed to area.  -Advised patient to use antihistamines OTC as needed -Advised patient if rash area gets worse to send a msg or call us or go to the nearest urgent care.    2) Vertigo -Patient has a history of vertigo.  -Sent in prescription for meclizine.  -Referral for ENT sent.   The patient was encouraged to call or send a message through MyChart for any questions or concerns.   Side effects and appropriate use of all the medication(s) were discussed with the patient today. Patient advised to use the medication(s) as directed by their healthcare provider. The patient was encouraged to read, review, and understand all associated package inserts and contact our office with any questions or concerns. The patient accepts the risks of the treatment plan and had an opportunity to ask questions.   Follow up: if symptoms persist or do not get better.    There are no diagnoses linked to this encounter.   COVID-19 Education: The signs and symptoms of COVID-19 were discussed with the patient and how to seek care for testing (follow up with  PCP or arrange E-visit).  The importance of social distancing was discussed today.  Patient Risk:   After full review of this patients clinical status, I feel that they are at least moderate risk at this time.  Time:   Today, I have spent 20 minutes/ seconds with the patient with telehealth technology discussing above diagnoses.     Medication Adjustments/Labs and Tests Ordered: Current medicines are reviewed at length with the patient today.  Concerns regarding medicines are outlined above.   Tests Ordered: No orders of the defined types were placed in this encounter.   Medication Changes: No orders of the defined types were placed  in this encounter.   Follow up: if symptoms persist or do not get better.    Signed, Charlesetta Ivory, NP

## 2020-11-11 ENCOUNTER — Other Ambulatory Visit: Payer: Self-pay | Admitting: Nurse Practitioner

## 2020-11-11 DIAGNOSIS — S40861A Insect bite (nonvenomous) of right upper arm, initial encounter: Secondary | ICD-10-CM

## 2020-11-11 DIAGNOSIS — W57XXXA Bitten or stung by nonvenomous insect and other nonvenomous arthropods, initial encounter: Secondary | ICD-10-CM

## 2020-11-11 MED ORDER — DOXYCYCLINE MONOHYDRATE 100 MG PO CAPS: 100.0000 mg | ORAL_CAPSULE | Freq: Two times a day (BID) | ORAL | 0 refills | Status: DC

## 2020-11-11 NOTE — Progress Notes (Signed)
Doxy prescription sent.

## 2020-11-29 ENCOUNTER — Other Ambulatory Visit: Payer: Self-pay

## 2020-11-29 ENCOUNTER — Ambulatory Visit: Payer: BC Managed Care – PPO | Admitting: Internal Medicine

## 2020-11-29 ENCOUNTER — Encounter: Payer: Self-pay | Admitting: Internal Medicine

## 2020-11-29 VITALS — BP 126/82 | HR 62 | Temp 98.6°F | Ht 64.2 in | Wt 146.2 lb

## 2020-11-29 DIAGNOSIS — R42 Dizziness and giddiness: Secondary | ICD-10-CM

## 2020-11-29 DIAGNOSIS — R350 Frequency of micturition: Secondary | ICD-10-CM

## 2020-11-29 DIAGNOSIS — E2839 Other primary ovarian failure: Secondary | ICD-10-CM | POA: Diagnosis not present

## 2020-11-29 DIAGNOSIS — E7800 Pure hypercholesterolemia, unspecified: Secondary | ICD-10-CM

## 2020-11-29 DIAGNOSIS — I10 Essential (primary) hypertension: Secondary | ICD-10-CM | POA: Diagnosis not present

## 2020-11-29 DIAGNOSIS — L601 Onycholysis: Secondary | ICD-10-CM

## 2020-11-29 DIAGNOSIS — Z23 Encounter for immunization: Secondary | ICD-10-CM

## 2020-11-29 LAB — POCT URINALYSIS DIPSTICK
Appearance: NEGATIVE
Bilirubin, UA: NEGATIVE
Blood, UA: NEGATIVE
Glucose, UA: NEGATIVE
Ketones, UA: NEGATIVE
Leukocytes, UA: NEGATIVE
Nitrite, UA: NEGATIVE
Protein, UA: NEGATIVE
Spec Grav, UA: 1.02 (ref 1.010–1.025)
Urobilinogen, UA: 0.2 E.U./dL
pH, UA: 7 (ref 5.0–8.0)

## 2020-11-29 LAB — CMP14+EGFR
ALT: 14 IU/L (ref 0–32)
AST: 18 IU/L (ref 0–40)
Albumin/Globulin Ratio: 1.5 (ref 1.2–2.2)
Albumin: 4.4 g/dL (ref 3.8–4.8)
Alkaline Phosphatase: 106 IU/L (ref 44–121)
BUN/Creatinine Ratio: 12 (ref 12–28)
BUN: 11 mg/dL (ref 8–27)
Bilirubin Total: 0.5 mg/dL (ref 0.0–1.2)
CO2: 26 mmol/L (ref 20–29)
Calcium: 10.3 mg/dL (ref 8.7–10.3)
Chloride: 99 mmol/L (ref 96–106)
Creatinine, Ser: 0.89 mg/dL (ref 0.57–1.00)
Globulin, Total: 2.9 g/dL (ref 1.5–4.5)
Glucose: 87 mg/dL (ref 65–99)
Potassium: 3.9 mmol/L (ref 3.5–5.2)
Sodium: 140 mmol/L (ref 134–144)
Total Protein: 7.3 g/dL (ref 6.0–8.5)
eGFR: 72 mL/min/{1.73_m2} (ref >59)

## 2020-11-29 LAB — LIPID PANEL
Chol/HDL Ratio: 3.9 ratio (ref 0.0–4.4)
Cholesterol, Total: 234 mg/dL — ABNORMAL HIGH (ref 100–199)
HDL: 60 mg/dL (ref >39)
LDL Chol Calc (NIH): 155 mg/dL — ABNORMAL HIGH (ref 0–99)
Triglycerides: 107 mg/dL (ref 0–149)
VLDL Cholesterol Cal: 19 mg/dL (ref 5–40)

## 2020-11-29 LAB — HEMOGLOBIN A1C
Est. average glucose Bld gHb Est-mCnc: 105 mg/dL
Hgb A1c MFr Bld: 5.3 % (ref 4.8–5.6)

## 2020-11-29 MED ORDER — SHINGRIX 50 MCG/0.5ML IM SUSR: 0.5000 mL | Freq: Once | INTRAMUSCULAR | 0 refills | Status: AC

## 2020-11-29 MED ORDER — CICLOPIROX 8 % EX SOLN: Freq: Every day | CUTANEOUS | 0 refills | Status: DC

## 2020-11-29 NOTE — Progress Notes (Signed)
I,Katawbba Wiggins,acting as a Education administrator for [name], MD.,have documented all relevant documentation on the behalf of [name], MD,as directed by  [name], MD while in the presence of [name], MD.  This visit occurred during the SARS-CoV-2 public health emergency.  Safety protocols were in place, including screening questions prior to the visit, additional usage of staff PPE, and extensive cleaning of exam room while observing appropriate contact time as indicated for disinfecting solutions.  Subjective:     Patient ID: Sierra Glover , female    DOB: June 11, 1956 , 65 y.o.   MRN: [id number]   Chief Complaint  Patient presents with   Hypertension    HPI  She presents today for BP check. She reports compliance with meds. She denies headaches, chest pain and shortness of breath. She reports she exercises on a daily basis.   Hypertension This is a chronic problem. The current episode started more than 1 year ago. The problem has been gradually improving since onset. The problem is controlled. Pertinent negatives include no blurred vision, chest pain, palpitations or shortness of breath. Past treatments include diuretics. The current treatment provides moderate improvement.    Past Medical History:  Diagnosis Date   HTN (hypertension)      Family History  Problem Relation Age of Onset   Alzheimer's disease Mother    Hypertension Mother    Hypertension Father    COPD Father    Hypertension Other      Current Outpatient Medications:    fexofenadine (ALLEGRA ALLERGY) 180 MG tablet, Take 1 tablet (180 mg total) by mouth daily., Disp: 90 tablet, Rfl: 1   fluticasone (FLONASE) 50 MCG/ACT nasal spray, SPRAY 1 SPRAY INTO EACH NOSTRIL EVERY DAY, Disp: 48 mL, Rfl: 1   meclizine (ANTIVERT) 25 MG tablet, Take 1 tablet (25 mg total) by mouth 2 (two) times daily as needed for dizziness., Disp: 30 tablet, Rfl: 0   triamterene-hydrochlorothiazide (MAXZIDE-25) 37.5-25 MG  tablet, Take 1 tablet by mouth daily., Disp: 90 tablet, Rfl: 2   valACYclovir (VALTREX) 500 MG tablet, take 1 tablet by oral route  every day, Disp: 90 tablet, Rfl: 0   Zoster Vaccine Adjuvanted (SHINGRIX) injection, Inject 0.5 mLs into the muscle once for 1 dose., Disp: 0.5 mL, Rfl: 0   ciclopirox (PENLAC) 8 % solution, Apply topically at bedtime. Apply over nail and surrounding skin. Apply daily over previous coat. After seven (7) days, may remove with alcohol and continue cycle., Disp: 6.6 mL, Rfl: 0   doxycycline (MONODOX) 100 MG capsule, Take 1 capsule (100 mg total) by mouth 2 (two) times daily. (Patient not taking: Reported on 11/29/2020), Disp: 14 capsule, Rfl: 0   No Known Allergies   Review of Systems  Constitutional: Negative.   Eyes:  Negative for blurred vision.  Respiratory: Negative.  Negative for shortness of breath.   Cardiovascular: Negative.  Negative for chest pain and palpitations.  Gastrointestinal: Negative.   Genitourinary:  Positive for frequency.  Skin:        C/o discoloration of her nail. Has had "rx nail polish" in the past. Would like a refill.   Neurological:  Positive for dizziness.       She reports having episodes of dizziness. Has past h/o vertigo. She reports last episode occurred while standing in line. She ate a cough drop and she felt better. Denies recent URI, but does admit she has been having issues with her allergies. She does take [employer]  but admits that she does not take daily.   Psychiatric/Behavioral: Negative.    All other systems reviewed and are negative.   Today's Vitals   11/29/20 0844  BP: 126/82  Pulse: 62  Temp: 98.6 F (37 C)  TempSrc: Oral  Weight: 146 lb 3.2 oz (66.3 kg)  Height: 5' 4.2" (1.631 m)  PainSc: 0-No pain   Body mass index is 24.94 kg/m.  Wt Readings from Last 3 Encounters:  11/29/20 146 lb 3.2 oz (66.3 kg)  11/09/20 143 lb (64.9 kg)  05/31/20 151 lb 12.8 oz (68.9 kg)    BP Readings from Last 3 Encounters:   11/29/20 126/82  11/09/20 135/79  05/31/20 130/76    Objective:  Physical Exam Vitals and nursing note reviewed.  Constitutional:      Appearance: Normal appearance.  HENT:     Head: Normocephalic and atraumatic.     Right Ear: Tympanic membrane, ear canal and external ear normal. There is no impacted cerumen.     Left Ear: Tympanic membrane, ear canal and external ear normal. There is no impacted cerumen.     Nose:     Comments: Masked     Mouth/Throat:     Comments: Masked  Cardiovascular:     Rate and Rhythm: Normal rate and regular rhythm.     Heart sounds: Normal heart sounds.  Pulmonary:     Effort: Pulmonary effort is normal.     Breath sounds: Normal breath sounds.  Skin:    General: Skin is warm.     Comments: Left ring finger - nail is lifting up. Slightly discolored.   Neurological:     General: No focal deficit present.     Mental Status: She is alert.  Psychiatric:        Mood and Affect: Mood normal.        Behavior: Behavior normal.        Assessment And Plan:     1. Essential hypertension, benign Comments: Chronic, well controlled.  I will check renal function. She will rto in six months for a full physical examination.  - CMP14+EGFR  2. Urinary frequency Comments: I will check urinalysis to r/o UTI.  - POCT Urinalysis Dipstick (81002)  3. Onycholysis Comments: I will send rx Penlac.   4. Estrogen deficiency Comments: I will send her for bone density.  She is encouraged to continue with her daily walking regimen.  - DG Bone Density; Future  5. Pure hypercholesterolemia Comments: Dec 2021 labs reviewed, LDL 134. Pt is aware goal LDL is less than 100. She is encouraged to limit her intake of fried foods, increase fiber and c/w exercise.  - Lipid panel  6. Dizziness Comments: Her sx are suggestive of vertigo. However, since she felt better after having cough drop, perhaps her sugar was dropping. She is encouraged to try eating small meals every  3 hours. I will check an a1c today as well. Denies family h/o diabetes.  - Hemoglobin A1c  7. Immunization due Comments: I will send rx Shingrix to her local pharmacy. Advised to wait at least 2 weeks after her COVID booster.     Patient was given opportunity to ask questions. Patient verbalized understanding of the plan and was able to repeat key elements of the plan. All questions were answered to their satisfaction.   I, [name], MD, have reviewed all documentation for this visit. The documentation on 11/29/20 for the exam, diagnosis, procedures, and orders are all accurate  and complete.   IF YOU HAVE BEEN REFERRED TO A SPECIALIST, IT MAY TAKE 1-2 WEEKS TO SCHEDULE/PROCESS THE REFERRAL. IF YOU HAVE NOT HEARD FROM US/SPECIALIST IN TWO WEEKS, PLEASE GIVE US A CALL AT [phone] X [phone].   THE PATIENT IS ENCOURAGED TO PRACTICE SOCIAL DISTANCING DUE TO THE COVID-19 PANDEMIC.    

## 2020-11-30 ENCOUNTER — Encounter: Payer: Self-pay | Admitting: Internal Medicine

## 2020-12-01 ENCOUNTER — Other Ambulatory Visit: Payer: Self-pay | Admitting: Internal Medicine

## 2020-12-01 MED ORDER — ATORVASTATIN CALCIUM 20 MG PO TABS: 20.0000 mg | ORAL_TABLET | Freq: Every day | ORAL | 11 refills | Status: DC

## 2021-01-01 ENCOUNTER — Encounter (HOSPITAL_COMMUNITY): Payer: Self-pay | Admitting: Emergency Medicine

## 2021-01-01 ENCOUNTER — Other Ambulatory Visit: Payer: Self-pay

## 2021-01-01 ENCOUNTER — Emergency Department (HOSPITAL_COMMUNITY): Payer: Medicare Other

## 2021-01-01 ENCOUNTER — Emergency Department (HOSPITAL_COMMUNITY)
Admission: EM | Admit: 2021-01-01 | Discharge: 2021-01-01 | Disposition: A | Discharge status: Home / Self Care | Payer: Medicare Other | Attending: Emergency Medicine | Admitting: Emergency Medicine

## 2021-01-01 DIAGNOSIS — S82391A Other fracture of lower end of right tibia, initial encounter for closed fracture: Secondary | ICD-10-CM | POA: Diagnosis not present

## 2021-01-01 DIAGNOSIS — Y92017 Garden or yard in single-family (private) house as the place of occurrence of the external cause: Secondary | ICD-10-CM | POA: Insufficient documentation

## 2021-01-01 DIAGNOSIS — X500XXA Overexertion from strenuous movement or load, initial encounter: Secondary | ICD-10-CM | POA: Diagnosis not present

## 2021-01-01 DIAGNOSIS — M25571 Pain in right ankle and joints of right foot: Secondary | ICD-10-CM | POA: Diagnosis present

## 2021-01-01 DIAGNOSIS — Y9389 Activity, other specified: Secondary | ICD-10-CM | POA: Insufficient documentation

## 2021-01-01 DIAGNOSIS — I10 Essential (primary) hypertension: Secondary | ICD-10-CM | POA: Diagnosis not present

## 2021-01-01 MED ORDER — OXYCODONE-ACETAMINOPHEN 5-325 MG PO TABS: 1.0000 | ORAL_TABLET | ORAL | 0 refills | Status: DC | PRN

## 2021-01-01 MED ORDER — HYDROCODONE-ACETAMINOPHEN 5-325 MG PO TABS
2.0000 | ORAL_TABLET | Freq: Once | ORAL | Status: AC
  Administered 2021-01-01: 2 via ORAL
  Filled 2021-01-01: qty 2

## 2021-01-01 NOTE — ED Provider Notes (Signed)
[facility] EMERGENCY DEPARTMENT Provider Note   CSN: [id number] Arrival date & time: 01/01/21  5993     History Chief Complaint  Patient presents with   Ankle Pain    Sierra Glover is a 65 y.o. female.  Pt reports she turned her ankle while working in her garden.  Pt reports no other injuries   The history is provided by the patient. No language interpreter was used.  Ankle Pain Location:  Ankle Time since incident:  1 hour Injury: no   Ankle location:  R ankle Pain details:    Quality:  Aching   Radiates to:  Does not radiate   Duration:  1 hour   Timing:  Constant   Progression:  Worsening Chronicity:  New Dislocation: no   Foreign body present:  No foreign bodies Relieved by:  Nothing Worsened by:  Nothing Ineffective treatments:  None tried Associated symptoms: swelling       Past Medical History:  Diagnosis Date   HTN (hypertension)     Patient Active Problem List   Diagnosis Date Noted   Disorder of bone and cartilage 04/03/2019   Brawny scleritis, 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     Family History  Problem Relation Age of Onset   Alzheimer's disease Mother    Hypertension Mother    Hypertension Father    COPD Father    Hypertension Other     Social History   Tobacco Use   Smoking status: Never   Smokeless tobacco: Never  Vaping Use   Vaping Use: Never used  Substance Use Topics   Alcohol use: Yes    Comment: social   Drug use: Never    Home Medications Prior to Admission medications   Medication Sig Start Date End Date Taking? Authorizing Provider  atorvastatin (LIPITOR) 20 MG tablet Take 1  tablet (20 mg total) by mouth daily. 12/01/20 12/01/21  Dorothyann Peng, MD  ciclopirox [facility]) 8 % solution Apply topically at bedtime. Apply over nail and surrounding skin. Apply daily over previous coat. After seven (7) days, may remove with alcohol and continue cycle. 11/29/20   Dorothyann Peng, MD  doxycycline (MONODOX) 100 MG capsule Take 1 capsule (100 mg total) by mouth 2 (two) times daily. Patient not taking: Reported on 11/29/2020 11/11/20   Charlesetta Ivory, NP  fexofenadine [facility] ALLERGY) 180 MG tablet Take 1 tablet (180 mg total) by mouth daily. 05/31/20   Dorothyann Peng, MD  fluticasone [facility]) 50 MCG/ACT nasal spray SPRAY 1 SPRAY INTO EACH NOSTRIL EVERY DAY 07/28/19   Dorothyann Peng, MD  meclizine (ANTIVERT) 25 MG tablet Take 1 tablet (25 mg total) by mouth 2 (two) times daily as needed for dizziness. 11/09/20   Charlesetta Ivory, NP  triamterene-hydrochlorothiazide (MAXZIDE-25) 37.5-25 MG tablet Take 1 tablet by mouth daily. 05/31/20   Dorothyann Peng, MD  valACYclovir (VALTREX) 500 MG tablet take 1 tablet by oral route  every day 09/20/18   Dorothyann Peng, MD    Allergies    Patient has no known allergies.  Review of Systems   Review of Systems  Musculoskeletal:  Positive for joint swelling.  All other systems  reviewed and are negative.  Physical Exam Updated Vital Signs BP (!) 154/87 (BP Location: Right Arm)   Pulse 88   Temp 98.6 F (37 C) (Oral)   Resp 16   Ht 5\' 5"  (1.651 m)   Wt 65.8 kg   SpO2 100%   BMI 24.13 kg/m   Physical Exam Vitals reviewed.  Constitutional:      Appearance: Normal appearance.  Cardiovascular:     Rate and Rhythm: Normal rate.  Pulmonary:     Effort: Pulmonary effort is normal.  Musculoskeletal:        General: Swelling and tenderness present.  Skin:    General: Skin is warm.  Neurological:     General: No focal deficit present.     Mental Status: She is alert.  Psychiatric:        Mood and Affect: Mood normal.    ED Results  / Procedures / Treatments   Labs (all labs ordered are listed, but only abnormal results are displayed) Labs Reviewed - No data to display  EKG None  Radiology No results found.  Procedures Procedures   Medications Ordered in ED Medications  HYDROcodone-acetaminophen (NORCO/VICODIN) 5-325 MG per tablet 2 tablet (2 tablets Oral Given 01/01/21 1100)    ED Course  I have reviewed the triage vital signs and the nursing notes.  Pertinent labs & imaging results that were available during my care of the patient were reviewed by me and considered in my medical decision making (see chart for details).    MDM Rules/Calculators/A&P                          MDM:  Pt given 2 hydrocodone  Xray shows distal tibia fracture.  I spoke with Dr. 01/03/21 who advised pad and splint, crutches.  His office will call pt on Monday to schedule appointment   Final Clinical Impression(s) / ED Diagnoses Final diagnoses:  Other closed fracture of distal end of right tibia, initial encounter    Rx / DC Orders ED Discharge Orders          Ordered    Consult to orthopedic surgery       Provider:  (Not yet assigned)   01/01/21 1129    oxyCODONE-acetaminophen (PERCOCET) 5-325 MG tablet  Every 4 hours PRN        01/01/21 1220          An After Visit Summary was printed and given to the patient.    01/03/21, Elson Areas 01/01/21 01/03/21, MD 01/03/21 [phone]

## 2021-01-01 NOTE — Discharge Instructions (Addendum)
See Dr. Romeo Apple for recheck this week.  Ice to area of swelling

## 2021-01-01 NOTE — ED Triage Notes (Signed)
Larey Seat this am outside working yard and slipped in grass.  Injury to right ankle.  Rates pain 8/10.

## 2021-01-01 NOTE — ED Notes (Signed)
Dr. Romeo Apple paged for Sierra Glover.

## 2021-01-03 ENCOUNTER — Other Ambulatory Visit: Payer: Self-pay

## 2021-01-03 ENCOUNTER — Encounter: Payer: Self-pay | Admitting: Orthopedic Surgery

## 2021-01-03 ENCOUNTER — Ambulatory Visit (INDEPENDENT_AMBULATORY_CARE_PROVIDER_SITE_OTHER): Payer: Medicare Other | Admitting: Orthopedic Surgery

## 2021-01-03 VITALS — BP 142/88 | HR 91 | Ht 65.0 in | Wt 145.0 lb

## 2021-01-03 DIAGNOSIS — S82391A Other fracture of lower end of right tibia, initial encounter for closed fracture: Secondary | ICD-10-CM

## 2021-01-03 DIAGNOSIS — W010XXA Fall on same level from slipping, tripping and stumbling without subsequent striking against object, initial encounter: Secondary | ICD-10-CM

## 2021-01-03 NOTE — Progress Notes (Signed)
NEW PROBLEM//OFFICE VISIT  Summary assessment and plan:   Case will be discussed with traumatologists:  Chief Complaint  Patient presents with   Ankle Injury    Right ankle/fell 01/01/21   New Patient (Initial Visit)   65 yo female fell 01/01/21 slipped on a bank spraying for weeds   Very comfortable at present   Not diabetic and not a smoker     MEDICAL DECISION MAKING  A.  Encounter Diagnosis  Name Primary?   Other closed fracture of distal end of right tibia, initial encounter Yes    B. DATA ANALYSED:   IMAGING: Interpretation of images: exeternal 2 views of the right distal tibia spiral distal metaphyseal fracture of the tibia 1 cm displacement no displacement on the lateral, on the third view there is angulation into varus of the distal fragment  Orders: No  Outside records reviewed: ER   C. MANAGEMENT     No orders of the defined types were placed in this encounter.    BP (!) 142/88   Pulse 91   Ht 5\' 5"  (1.651 m)   Wt 145 lb (65.8 kg)   BMI 24.13 kg/m    General appearance: Well-developed well-nourished no gross deformities  Cardiovascular normal pulse and perfusion normal color without edema  Neurologically no sensation loss or deficits or pathologic reflexes  Psychological: Awake alert and oriented x3 mood and affect normal  Skin no lacerations or ulcerations no nodularity no palpable masses, no erythema or nodularity  Musculoskeletal: Toes are nonswollen foot is warm compartments are soft   ROS All negative  Past Medical History:  Diagnosis Date   HTN (hypertension)     Past Surgical History:  Procedure Laterality Date   partal hysterectomy  2009    Family History  Problem Relation Age of Onset   Alzheimer's disease Mother    Hypertension Mother    Hypertension Father    COPD Father    Hypertension Other    Social History   Tobacco Use   Smoking status: Never   Smokeless tobacco: Never  Vaping Use   Vaping Use:  Never used  Substance Use Topics   Alcohol use: Yes    Comment: social   Drug use: Never    No Known Allergies  Current Meds  Medication Sig   atorvastatin (LIPITOR) 20 MG tablet Take 1 tablet (20 mg total) by mouth daily.   ciclopirox (PENLAC) 8 % solution Apply topically at bedtime. Apply over nail and surrounding skin. Apply daily over previous coat. After seven (7) days, may remove with alcohol and continue cycle.   fexofenadine (ALLEGRA ALLERGY) 180 MG tablet Take 1 tablet (180 mg total) by mouth daily.   fluticasone (FLONASE) 50 MCG/ACT nasal spray SPRAY 1 SPRAY INTO EACH NOSTRIL EVERY DAY   meclizine (ANTIVERT) 25 MG tablet Take 1 tablet (25 mg total) by mouth 2 (two) times daily as needed for dizziness.   oxyCODONE-acetaminophen (PERCOCET) 5-325 MG tablet Take 1 tablet by mouth every 4 (four) hours as needed for severe pain.   triamterene-hydrochlorothiazide (MAXZIDE-25) 37.5-25 MG tablet Take 1 tablet by mouth daily.   valACYclovir (VALTREX) 500 MG tablet take 1 tablet by oral route  every day        03-25-1992, MD  01/03/2021 2:44 PM

## 2021-01-05 ENCOUNTER — Ambulatory Visit: Payer: Self-pay | Admitting: Student

## 2021-01-05 ENCOUNTER — Telehealth: Payer: Self-pay | Admitting: Orthopedic Surgery

## 2021-01-05 ENCOUNTER — Other Ambulatory Visit: Payer: Self-pay | Admitting: Orthopedic Surgery

## 2021-01-05 DIAGNOSIS — S82391A Other fracture of lower end of right tibia, initial encounter for closed fracture: Secondary | ICD-10-CM

## 2021-01-05 DIAGNOSIS — S82231A Displaced oblique fracture of shaft of right tibia, initial encounter for closed fracture: Secondary | ICD-10-CM | POA: Insufficient documentation

## 2021-01-05 NOTE — Telephone Encounter (Signed)
Patient called wanting to speak with Dr. Romeo Apple stating he was suppose to call her back and let her know if she needed to go to [facility] to have surgery and he has not called her back yet.   Please call her back at   [facility] Phone # [phone] Home Number [phone]

## 2021-01-06 ENCOUNTER — Encounter (HOSPITAL_COMMUNITY): Payer: Self-pay | Admitting: Student

## 2021-01-06 ENCOUNTER — Other Ambulatory Visit: Payer: Self-pay

## 2021-01-06 NOTE — Progress Notes (Signed)
Sierra Glover denies chest pain or shortness of breath. Patient denies having any s/s of Covid in her household.  Patient denies any known exposure to Covid.   I instructed patient to wash up very well ( has a soft cast) with antibiotic soap, if it is available.  Dry off with a clean towel. Do not put lotion, powder, cologne or deodorant or makeup.No jewelry or piercings. Man may shave their face and neck. Woman should not shave. No nail polish, artificial or acrylic nails. Wear clean clothes, brush your teeth. Sierra Glover states that she has nail polish on her toes and see can only see 3 toes- I said remove the ones you can  see.  Glasses, contact lens,dentures or partials may not be worn in the OR. If you need to wear them, please bring a case for glasses, do not wear contacts or bring a case, the hospital does not have contact cases, dentures or partials will have to be removed  make sure they are clean, we will provide a denture cup to put them in. You will need some one to drive you home and a responsible person over the age of 66 to stay with you for the first 24 hours after surgery.

## 2021-01-07 ENCOUNTER — Observation Stay (HOSPITAL_COMMUNITY): Payer: Medicare Other

## 2021-01-07 ENCOUNTER — Encounter (HOSPITAL_COMMUNITY): Payer: Self-pay | Admitting: Student

## 2021-01-07 ENCOUNTER — Ambulatory Visit (HOSPITAL_COMMUNITY): Payer: Medicare Other | Admitting: Certified Registered"

## 2021-01-07 ENCOUNTER — Ambulatory Visit (HOSPITAL_COMMUNITY): Payer: Medicare Other

## 2021-01-07 ENCOUNTER — Observation Stay (HOSPITAL_COMMUNITY)
Admission: RE | Admit: 2021-01-07 | Discharge: 2021-01-08 | Disposition: A | Discharge status: Home / Self Care | Payer: Medicare Other | Attending: Student | Admitting: Student

## 2021-01-07 ENCOUNTER — Encounter (HOSPITAL_COMMUNITY)
Admission: RE | Disposition: A | Discharge status: Home / Self Care | Payer: Self-pay | Source: Home / Self Care | Attending: Student

## 2021-01-07 DIAGNOSIS — Y92007 Garden or yard of unspecified non-institutional (private) residence as the place of occurrence of the external cause: Secondary | ICD-10-CM | POA: Diagnosis not present

## 2021-01-07 DIAGNOSIS — Z79899 Other long term (current) drug therapy: Secondary | ICD-10-CM | POA: Insufficient documentation

## 2021-01-07 DIAGNOSIS — Z419 Encounter for procedure for purposes other than remedying health state, unspecified: Secondary | ICD-10-CM

## 2021-01-07 DIAGNOSIS — T148XXA Other injury of unspecified body region, initial encounter: Secondary | ICD-10-CM

## 2021-01-07 DIAGNOSIS — S82231A Displaced oblique fracture of shaft of right tibia, initial encounter for closed fracture: Secondary | ICD-10-CM | POA: Diagnosis present

## 2021-01-07 DIAGNOSIS — I10 Essential (primary) hypertension: Secondary | ICD-10-CM | POA: Insufficient documentation

## 2021-01-07 DIAGNOSIS — S8251XA Displaced fracture of medial malleolus of right tibia, initial encounter for closed fracture: Secondary | ICD-10-CM | POA: Diagnosis not present

## 2021-01-07 DIAGNOSIS — R2681 Unsteadiness on feet: Secondary | ICD-10-CM | POA: Diagnosis not present

## 2021-01-07 DIAGNOSIS — Z20822 Contact with and (suspected) exposure to covid-19: Secondary | ICD-10-CM | POA: Diagnosis not present

## 2021-01-07 DIAGNOSIS — W010XXA Fall on same level from slipping, tripping and stumbling without subsequent striking against object, initial encounter: Secondary | ICD-10-CM | POA: Insufficient documentation

## 2021-01-07 DIAGNOSIS — S82301A Unspecified fracture of lower end of right tibia, initial encounter for closed fracture: Secondary | ICD-10-CM | POA: Diagnosis present

## 2021-01-07 DIAGNOSIS — R2689 Other abnormalities of gait and mobility: Secondary | ICD-10-CM | POA: Insufficient documentation

## 2021-01-07 HISTORY — DX: Acute embolism and thrombosis of unspecified deep veins of unspecified lower extremity: I82.409

## 2021-01-07 HISTORY — PX: TIBIA IM NAIL INSERTION: SHX2516

## 2021-01-07 HISTORY — DX: Anemia, unspecified: D64.9

## 2021-01-07 LAB — CBC
HCT: 40.8 % (ref 36.0–46.0)
HCT: 38.1 % (ref 36.0–46.0)
Hemoglobin: 13.4 g/dL (ref 12.0–15.0)
Hemoglobin: 12.3 g/dL (ref 12.0–15.0)
MCH: 28.7 pg (ref 26.0–34.0)
MCH: 28.3 pg (ref 26.0–34.0)
MCHC: 32.8 g/dL (ref 30.0–36.0)
MCHC: 32.3 g/dL (ref 30.0–36.0)
MCV: 87.4 fL (ref 80.0–100.0)
MCV: 87.8 fL (ref 80.0–100.0)
Platelets: 301 10*3/uL (ref 150–400)
Platelets: 294 10*3/uL (ref 150–400)
RBC: 4.67 MIL/uL (ref 3.87–5.11)
RBC: 4.34 MIL/uL (ref 3.87–5.11)
RDW: 12.2 % (ref 11.5–15.5)
RDW: 12 % (ref 11.5–15.5)
WBC: 9.5 10*3/uL (ref 4.0–10.5)
WBC: 12.7 10*3/uL — ABNORMAL HIGH (ref 4.0–10.5)
nRBC: 0 % (ref 0.0–0.2)
nRBC: 0 % (ref 0.0–0.2)

## 2021-01-07 LAB — VITAMIN D 25 HYDROXY (VIT D DEFICIENCY, FRACTURES): Vit D, 25-Hydroxy: 30.11 ng/mL (ref 30–100)

## 2021-01-07 LAB — BASIC METABOLIC PANEL
Anion gap: 12 (ref 5–15)
BUN: 18 mg/dL (ref 8–23)
CO2: 29 mmol/L (ref 22–32)
Calcium: 10.1 mg/dL (ref 8.9–10.3)
Chloride: 98 mmol/L (ref 98–111)
Creatinine, Ser: 0.91 mg/dL (ref 0.44–1.00)
GFR, Estimated: >60 mL/min (ref >60)
Glucose, Bld: 132 mg/dL — ABNORMAL HIGH (ref 70–99)
Potassium: 2.8 mmol/L — ABNORMAL LOW (ref 3.5–5.1)
Sodium: 139 mmol/L (ref 135–145)

## 2021-01-07 LAB — CREATININE, SERUM
Creatinine, Ser: 0.95 mg/dL (ref 0.44–1.00)
GFR, Estimated: >60 mL/min (ref >60)

## 2021-01-07 LAB — SARS CORONAVIRUS 2 BY RT PCR (HOSPITAL ORDER, PERFORMED IN ~~LOC~~ HOSPITAL LAB): SARS Coronavirus 2: NEGATIVE

## 2021-01-07 SURGERY — INSERTION, INTRAMEDULLARY ROD, TIBIA
Anesthesia: General | Laterality: Right

## 2021-01-07 MED ORDER — BUPIVACAINE-EPINEPHRINE (PF) 0.25% -1:200000 IJ SOLN
INTRAMUSCULAR | Status: DC | PRN
  Administered 2021-01-07: 20 mL

## 2021-01-07 MED ORDER — FENTANYL CITRATE (PF) 100 MCG/2ML IJ SOLN
25.0000 ug | INTRAMUSCULAR | Status: DC | PRN
  Administered 2021-01-07 (×2): 25 ug via INTRAVENOUS

## 2021-01-07 MED ORDER — METHOCARBAMOL 500 MG PO TABS: 500.0000 mg | ORAL_TABLET | Freq: Four times a day (QID) | ORAL | Status: DC | PRN

## 2021-01-07 MED ORDER — BUPIVACAINE-EPINEPHRINE (PF) 0.25% -1:200000 IJ SOLN
INTRAMUSCULAR | Status: AC
  Filled 2021-01-07: qty 30

## 2021-01-07 MED ORDER — 0.9 % SODIUM CHLORIDE (POUR BTL) OPTIME
TOPICAL | Status: DC | PRN
  Administered 2021-01-07: 1000 mL

## 2021-01-07 MED ORDER — LORATADINE 10 MG PO TABS
10.0000 mg | ORAL_TABLET | Freq: Every day | ORAL | Status: DC
  Administered 2021-01-07 – 2021-01-08 (×2): 10 mg via ORAL
  Filled 2021-01-07 (×2): qty 1

## 2021-01-07 MED ORDER — CEFAZOLIN SODIUM-DEXTROSE 2-4 GM/100ML-% IV SOLN
2.0000 g | Freq: Three times a day (TID) | INTRAVENOUS | Status: AC
  Administered 2021-01-07 – 2021-01-08 (×3): 2 g via INTRAVENOUS
  Filled 2021-01-07 (×3): qty 100

## 2021-01-07 MED ORDER — ENOXAPARIN SODIUM 40 MG/0.4ML IJ SOSY
40.0000 mg | PREFILLED_SYRINGE | INTRAMUSCULAR | Status: DC
  Administered 2021-01-08: 40 mg via SUBCUTANEOUS
  Filled 2021-01-07: qty 0.4

## 2021-01-07 MED ORDER — VANCOMYCIN HCL 1000 MG IV SOLR
INTRAVENOUS | Status: DC | PRN
  Administered 2021-01-07: 1000 mg via TOPICAL

## 2021-01-07 MED ORDER — MECLIZINE HCL 25 MG PO TABS
25.0000 mg | ORAL_TABLET | Freq: Two times a day (BID) | ORAL | Status: DC | PRN
  Filled 2021-01-07: qty 1

## 2021-01-07 MED ORDER — MIDAZOLAM HCL 2 MG/2ML IJ SOLN
INTRAMUSCULAR | Status: AC
  Filled 2021-01-07: qty 2

## 2021-01-07 MED ORDER — ONDANSETRON HCL 4 MG/2ML IJ SOLN
INTRAMUSCULAR | Status: AC
  Filled 2021-01-07: qty 2

## 2021-01-07 MED ORDER — ONDANSETRON HCL 4 MG/2ML IJ SOLN: 4.0000 mg | Freq: Four times a day (QID) | INTRAMUSCULAR | Status: DC | PRN

## 2021-01-07 MED ORDER — LIDOCAINE HCL (CARDIAC) PF 100 MG/5ML IV SOSY
PREFILLED_SYRINGE | INTRAVENOUS | Status: DC | PRN
  Administered 2021-01-07: 100 mg via INTRAVENOUS

## 2021-01-07 MED ORDER — ATORVASTATIN CALCIUM 10 MG PO TABS
20.0000 mg | ORAL_TABLET | Freq: Every day | ORAL | Status: DC
  Administered 2021-01-07 – 2021-01-08 (×2): 20 mg via ORAL
  Filled 2021-01-07 (×2): qty 2

## 2021-01-07 MED ORDER — MIDAZOLAM HCL 2 MG/2ML IJ SOLN
INTRAMUSCULAR | Status: DC | PRN
Start: 2021-01-07 — End: 2021-01-07
  Administered 2021-01-07: 2 mg via INTRAVENOUS

## 2021-01-07 MED ORDER — DOCUSATE SODIUM 100 MG PO CAPS
100.0000 mg | ORAL_CAPSULE | Freq: Two times a day (BID) | ORAL | Status: DC
  Administered 2021-01-07 – 2021-01-08 (×2): 100 mg via ORAL
  Filled 2021-01-07 (×2): qty 1

## 2021-01-07 MED ORDER — HYDROMORPHONE HCL 1 MG/ML IJ SOLN: 0.5000 mg | INTRAMUSCULAR | Status: DC | PRN

## 2021-01-07 MED ORDER — TRIAMTERENE-HCTZ 37.5-25 MG PO TABS
1.0000 | ORAL_TABLET | Freq: Every day | ORAL | Status: DC
  Administered 2021-01-07 – 2021-01-08 (×2): 1 via ORAL
  Filled 2021-01-07 (×2): qty 1

## 2021-01-07 MED ORDER — LIDOCAINE 2% (20 MG/ML) 5 ML SYRINGE
INTRAMUSCULAR | Status: AC
  Filled 2021-01-07: qty 5

## 2021-01-07 MED ORDER — ONDANSETRON HCL 4 MG PO TABS: 4.0000 mg | ORAL_TABLET | Freq: Four times a day (QID) | ORAL | Status: DC | PRN

## 2021-01-07 MED ORDER — CHLORHEXIDINE GLUCONATE 0.12 % MT SOLN
15.0000 mL | Freq: Once | OROMUCOSAL | Status: AC
  Administered 2021-01-07: 15 mL via OROMUCOSAL
  Filled 2021-01-07: qty 15

## 2021-01-07 MED ORDER — POTASSIUM CHLORIDE IN NACL 20-0.9 MEQ/L-% IV SOLN
INTRAVENOUS | Status: DC
  Filled 2021-01-07: qty 1000

## 2021-01-07 MED ORDER — SODIUM CHLORIDE (PF) 0.9 % IJ SOLN
INTRAMUSCULAR | Status: AC
  Filled 2021-01-07: qty 10

## 2021-01-07 MED ORDER — DOCUSATE SODIUM 100 MG PO CAPS: 100.0000 mg | ORAL_CAPSULE | Freq: Two times a day (BID) | ORAL | 0 refills | Status: DC

## 2021-01-07 MED ORDER — VANCOMYCIN HCL 1000 MG IV SOLR
INTRAVENOUS | Status: AC
  Filled 2021-01-07: qty 1000

## 2021-01-07 MED ORDER — OXYCODONE-ACETAMINOPHEN 5-325 MG PO TABS
ORAL_TABLET | ORAL | Status: AC
  Administered 2021-01-07: 1 via ORAL
  Filled 2021-01-07: qty 1

## 2021-01-07 MED ORDER — PROPOFOL 10 MG/ML IV BOLUS
INTRAVENOUS | Status: DC | PRN
  Administered 2021-01-07: 150 mg via INTRAVENOUS

## 2021-01-07 MED ORDER — METOCLOPRAMIDE HCL 5 MG PO TABS: 5.0000 mg | ORAL_TABLET | Freq: Three times a day (TID) | ORAL | Status: DC | PRN

## 2021-01-07 MED ORDER — ACETAMINOPHEN 10 MG/ML IV SOLN
INTRAVENOUS | Status: DC | PRN
  Administered 2021-01-07: 1000 mg via INTRAVENOUS

## 2021-01-07 MED ORDER — METOCLOPRAMIDE HCL 5 MG/ML IJ SOLN: 5.0000 mg | Freq: Three times a day (TID) | INTRAMUSCULAR | Status: DC | PRN

## 2021-01-07 MED ORDER — SUFENTANIL CITRATE 50 MCG/ML IV SOLN
INTRAVENOUS | Status: AC
  Filled 2021-01-07: qty 1

## 2021-01-07 MED ORDER — ACETAMINOPHEN 325 MG PO TABS: 325.0000 mg | ORAL_TABLET | Freq: Four times a day (QID) | ORAL | Status: DC | PRN

## 2021-01-07 MED ORDER — LACTATED RINGERS IV SOLN: INTRAVENOUS | Status: DC

## 2021-01-07 MED ORDER — FENTANYL CITRATE (PF) 100 MCG/2ML IJ SOLN
INTRAMUSCULAR | Status: AC
  Administered 2021-01-07: 50 ug via INTRAVENOUS
  Filled 2021-01-07: qty 2

## 2021-01-07 MED ORDER — OXYCODONE-ACETAMINOPHEN 5-325 MG PO TABS
1.0000 | ORAL_TABLET | ORAL | Status: DC | PRN
  Administered 2021-01-07: 2 via ORAL
  Administered 2021-01-08 (×2): 1 via ORAL
  Administered 2021-01-08: 2 via ORAL
  Administered 2021-01-08: 1 via ORAL
  Filled 2021-01-07 (×2): qty 1
  Filled 2021-01-07 (×2): qty 2
  Filled 2021-01-07: qty 1

## 2021-01-07 MED ORDER — DEXAMETHASONE SODIUM PHOSPHATE 10 MG/ML IJ SOLN
INTRAMUSCULAR | Status: DC | PRN
  Administered 2021-01-07: 10 mg via INTRAVENOUS

## 2021-01-07 MED ORDER — SUCCINYLCHOLINE 20MG/ML (10ML) SYRINGE FOR MEDFUSION PUMP - OPTIME
INTRAMUSCULAR | Status: DC | PRN
  Administered 2021-01-07: 120 mg via INTRAVENOUS

## 2021-01-07 MED ORDER — ORAL CARE MOUTH RINSE: 15.0000 mL | Freq: Once | OROMUCOSAL | Status: AC

## 2021-01-07 MED ORDER — METHOCARBAMOL 1000 MG/10ML IJ SOLN
500.0000 mg | Freq: Four times a day (QID) | INTRAVENOUS | Status: DC | PRN
  Filled 2021-01-07: qty 5

## 2021-01-07 MED ORDER — LACTATED RINGERS IV SOLN: INTRAVENOUS | Status: DC | PRN

## 2021-01-07 MED ORDER — SUCCINYLCHOLINE CHLORIDE 200 MG/10ML IV SOSY
PREFILLED_SYRINGE | INTRAVENOUS | Status: AC
  Filled 2021-01-07: qty 10

## 2021-01-07 MED ORDER — ONDANSETRON HCL 4 MG/2ML IJ SOLN
INTRAMUSCULAR | Status: DC | PRN
  Administered 2021-01-07: 4 mg via INTRAVENOUS

## 2021-01-07 MED ORDER — ASPIRIN EC 325 MG PO TBEC: 325.0000 mg | DELAYED_RELEASE_TABLET | Freq: Two times a day (BID) | ORAL | 0 refills | Status: AC

## 2021-01-07 MED ORDER — SUFENTANIL CITRATE 50 MCG/ML IV SOLN
INTRAVENOUS | Status: DC | PRN
  Administered 2021-01-07 (×2): 10 ug via INTRAVENOUS

## 2021-01-07 MED ORDER — PHENYLEPHRINE HCL (PRESSORS) 10 MG/ML IV SOLN
INTRAVENOUS | Status: DC | PRN
  Administered 2021-01-07: 80 ug via INTRAVENOUS

## 2021-01-07 MED ORDER — PHENYLEPHRINE 40 MCG/ML (10ML) SYRINGE FOR IV PUSH (FOR BLOOD PRESSURE SUPPORT)
PREFILLED_SYRINGE | INTRAVENOUS | Status: AC
  Filled 2021-01-07: qty 10

## 2021-01-07 MED ORDER — CEFAZOLIN SODIUM-DEXTROSE 2-4 GM/100ML-% IV SOLN
2.0000 g | INTRAVENOUS | Status: AC
  Administered 2021-01-07: 2 g via INTRAVENOUS
  Filled 2021-01-07: qty 100

## 2021-01-07 MED ORDER — FLUTICASONE PROPIONATE 50 MCG/ACT NA SUSP: 2.0000 | Freq: Every day | NASAL | Status: DC | PRN

## 2021-01-07 MED ORDER — PROPOFOL 10 MG/ML IV BOLUS
INTRAVENOUS | Status: AC
  Filled 2021-01-07: qty 20

## 2021-01-07 MED ORDER — DEXAMETHASONE SODIUM PHOSPHATE 10 MG/ML IJ SOLN
INTRAMUSCULAR | Status: AC
  Filled 2021-01-07: qty 1

## 2021-01-07 MED ORDER — OXYCODONE-ACETAMINOPHEN 5-325 MG PO TABS: 1.0000 | ORAL_TABLET | ORAL | 0 refills | Status: DC | PRN

## 2021-01-07 MED ORDER — POLYETHYLENE GLYCOL 3350 17 G PO PACK
17.0000 g | PACK | Freq: Every day | ORAL | Status: DC | PRN
Start: 2021-01-07 — End: 2021-01-08

## 2021-01-07 SURGICAL SUPPLY — 71 items
APL PRP STRL LF DISP 70% ISPRP (MISCELLANEOUS) ×1
BAG COUNTER SPONGE SURGICOUNT (BAG) ×2 IMPLANT
BAG SPNG CNTER NS LX DISP (BAG) ×1
BIT DRILL 2.5 X LONG (BIT) ×1
BIT DRILL FLUTED FEMUR 4.2/3 (BIT) ×1 IMPLANT
BIT DRILL SHORT 4.2 (BIT) IMPLANT
BIT DRILL X LONG 2.5 (BIT) IMPLANT
BLADE SURG 10 STRL SS (BLADE) ×4 IMPLANT
BNDG CMPR MED 15X6 ELC VLCR LF (GAUZE/BANDAGES/DRESSINGS) ×1
BNDG COHESIVE 4X5 TAN STRL (GAUZE/BANDAGES/DRESSINGS) ×2 IMPLANT
BNDG ELASTIC 4X5.8 VLCR STR LF (GAUZE/BANDAGES/DRESSINGS) ×2 IMPLANT
BNDG ELASTIC 6X15 VLCR STRL LF (GAUZE/BANDAGES/DRESSINGS) ×1 IMPLANT
BNDG ELASTIC 6X5.8 VLCR STR LF (GAUZE/BANDAGES/DRESSINGS) ×2 IMPLANT
BNDG GAUZE ELAST 4 BULKY (GAUZE/BANDAGES/DRESSINGS) ×2 IMPLANT
BOOT STEPPER DURA MED (SOFTGOODS) ×1 IMPLANT
BRUSH SCRUB EZ PLAIN DRY (MISCELLANEOUS) ×3 IMPLANT
CHLORAPREP W/TINT 26 (MISCELLANEOUS) ×2 IMPLANT
CLSR STERI-STRIP ANTIMIC 1/2X4 (GAUZE/BANDAGES/DRESSINGS) ×1 IMPLANT
COVER SURGICAL LIGHT HANDLE (MISCELLANEOUS) ×3 IMPLANT
DRAPE C-ARM 42X72 X-RAY (DRAPES) ×2 IMPLANT
DRAPE C-ARMOR (DRAPES) ×2 IMPLANT
DRAPE HALF SHEET 40X57 (DRAPES) ×4 IMPLANT
DRAPE IMP U-DRAPE 54X76 (DRAPES) ×4 IMPLANT
DRAPE INCISE IOBAN 66X45 STRL (DRAPES) IMPLANT
DRAPE ORTHO SPLIT 77X108 STRL (DRAPES) ×4
DRAPE SURG ORHT 6 SPLT 77X108 (DRAPES) ×2 IMPLANT
DRAPE U-SHAPE 47X51 STRL (DRAPES) ×2 IMPLANT
DRILL BIT SHORT 4.2 (BIT) ×4
DRILL BIT X LONG 2.5 (BIT) ×2
DRSG ADAPTIC 3X8 NADH LF (GAUZE/BANDAGES/DRESSINGS) ×2 IMPLANT
ELECT REM PT RETURN 9FT ADLT (ELECTROSURGICAL) ×2
ELECTRODE REM PT RTRN 9FT ADLT (ELECTROSURGICAL) ×1 IMPLANT
GAUZE SPONGE 4X4 12PLY STRL (GAUZE/BANDAGES/DRESSINGS) ×2 IMPLANT
GLOVE SURG ENC MOIS LTX SZ6.5 (GLOVE) ×6 IMPLANT
GLOVE SURG ENC MOIS LTX SZ7.5 (GLOVE) ×8 IMPLANT
GLOVE SURG UNDER POLY LF SZ6.5 (GLOVE) ×2 IMPLANT
GLOVE SURG UNDER POLY LF SZ7.5 (GLOVE) ×2 IMPLANT
GOWN STRL REUS W/ TWL LRG LVL3 (GOWN DISPOSABLE) ×2 IMPLANT
GOWN STRL REUS W/TWL LRG LVL3 (GOWN DISPOSABLE) ×4
GUIDEWIRE 3.2X400 (WIRE) ×1 IMPLANT
KIT BASIN OR (CUSTOM PROCEDURE TRAY) ×2 IMPLANT
KIT TURNOVER KIT B (KITS) ×2 IMPLANT
NAIL TIB TFNA 9X330 (Nail) ×1 IMPLANT
NEEDLE 22X1 1/2 (OR ONLY) (NEEDLE) ×1 IMPLANT
PACK TOTAL JOINT (CUSTOM PROCEDURE TRAY) ×2 IMPLANT
PAD ARMBOARD 7.5X6 YLW CONV (MISCELLANEOUS) ×4 IMPLANT
PAD CAST 4YDX4 CTTN HI CHSV (CAST SUPPLIES) IMPLANT
PADDING CAST COTTON 4X4 STRL (CAST SUPPLIES) ×2
PADDING CAST COTTON 6X4 STRL (CAST SUPPLIES) ×1 IMPLANT
REAMER ROD DEEP FLUTE 2.5X950 (INSTRUMENTS) ×1 IMPLANT
SCREW CORTEX 3.5 38MM (Screw) ×1 IMPLANT
SCREW LOCK CORT ST 3.5X38 (Screw) IMPLANT
SCREW LOCK IM 46X5X TI NIOBIUM (Screw) IMPLANT
SCREW LOCK IM 5X36 (Screw) ×2 IMPLANT
SCREW LOCK IM NAIL 5X46 (Screw) ×2 IMPLANT
SCREW LOCK IM NL 5X38 (Screw) ×1 IMPLANT
SCREW LOCK IM TI 5X32 (Screw) ×1 IMPLANT
SCREW LOCK X25 36X5X IM (Screw) IMPLANT
SPONGE T-LAP 18X18 ~~LOC~~+RFID (SPONGE) ×1 IMPLANT
STAPLER VISISTAT 35W (STAPLE) ×2 IMPLANT
SUT MNCRL AB 3-0 PS2 18 (SUTURE) ×2 IMPLANT
SUT MON AB 3-0 SH 27 (SUTURE) ×4
SUT MON AB 3-0 SH27 (SUTURE) IMPLANT
SUT VIC AB 0 CT1 27 (SUTURE) ×2
SUT VIC AB 0 CT1 27XBRD ANBCTR (SUTURE) IMPLANT
SUT VIC AB 2-0 CT1 27 (SUTURE) ×2
SUT VIC AB 2-0 CT1 TAPERPNT 27 (SUTURE) IMPLANT
SYR CONTROL 10ML LL (SYRINGE) ×1 IMPLANT
TOWEL GREEN STERILE (TOWEL DISPOSABLE) ×4 IMPLANT
TOWEL GREEN STERILE FF (TOWEL DISPOSABLE) ×2 IMPLANT
YANKAUER SUCT BULB TIP NO VENT (SUCTIONS) ×1 IMPLANT

## 2021-01-07 NOTE — Op Note (Signed)
Orthopaedic Surgery Operative Note (CSN: [id number] ) Date of Surgery: 01/07/2021  Admit Date: 01/07/2021   Diagnoses: Pre-Op Diagnoses: Right distal tibia fracture  Post-Op Diagnosis: Right distal tibial shaft fracture Right medial malleolus fracture  Procedures: CPT 27759-Intramedullary nailing of right tibia fracture CPT 27766-Percutaneous fixation of right medial malleolus fracture CPT 77071-External rotation stress view right ankle  Surgeons : Primary: Taylia Berber, Gillie Manners, MD  Assistant: Ulyses Southward, PA-C  Location: OR 3   Anesthesia:General   Antibiotics: Ancef 2g preop with 1gm vancomycin powder placed topically   Tourniquet time:None    Estimated Blood Loss:30 mL  Complications:None   Specimens:None   Implants: Implant Name Type Inv. Item Serial No. Manufacturer Lot No. LRB No. Used Action  SCREW CORTEX 3.5 - [id number] Screw SCREW CORTEX 3.5  [employer]  Right 1 Implanted  NAIL TIB TFNA 9X330 - [id number] Nail NAIL TIB TFNA 9X330  [employer] [id number] Right 1 Implanted  SCREW LOCK IM 5X36 - [id number] Screw SCREW LOCK IM 5X36  [employer]  Right 1 Implanted  SCREW LOCK IM NL 5X38 - [id number] Screw SCREW LOCK IM NL 5X38  [employer]  Right 1 Implanted  SCREW LOCK IM NAIL 5X46 - [id number] Screw SCREW LOCK IM NAIL 5X46  [employer]  Right 1 Implanted  SCREW LOCK IM NL 5X32 - [id number] Screw SCREW LOCK IM NL 5X32  [employer]  Right 1 Implanted     Indications for Surgery: 65 year old female who slipped and injured her right leg sustained a distal tibia fracture.  Due to the unstable nature of her injury I recommend proceeding with intramedullary nailing versus open reduction.  Risks and benefits were discussed with the patient.  Risks include but not limited to bleeding, infection, malunion, nonunion, hardware failure, hardware irritation, nerve or blood vessel injury, posttraumatic arthritis, ankle stiffness, DVT, even  the possibility anesthetic complications.  She agreed to proceed with surgery and consent was obtained.  Operative Findings: 1.  Intramedullary nailing of right tibial shaft fracture using Synthes TNA 9 x 330 mm nail 2.  Percutaneous fixation of medial malleolus fracture using Synthes 3.5 millimeter screw from medial to lateral 3.  External rotation stress view to evaluate the syndesmosis with proximal fibula fracture showing no syndesmotic disruption after fixation  Procedure: The patient was identified in the preoperative holding area. Consent was confirmed with the patient and their family and all questions were answered. The operative extremity was marked after confirmation with the patient. she was then brought back to the operating room by our anesthesia colleagues.  She was carefully transferred over to a radiolucent flat top table.  She was placed under general anesthetic.  A bump was placed under her operative hip.  The right lower extremity was then prepped and draped in usual sterile fashion.  A timeout was performed to verify the patient, the procedure, and the extremity.  Preoperative antibiotics were obtained.  Fluoroscopic imaging was obtained to show the unstable nature of her injury.  I made percutaneous incisions around the fracture site and used a reduction tenaculum to reduce the fracture in the coronal plane.  There is a slight displacement on the lateral view which was attempted to be corrected.  Adequate reduction was obtained.  On the AP images after a reduction was performed there was a split vertically along the medial malleolus.  A percutaneous incision was made and a 3.5 millimeter screw was placed from medial to lateral to provisionally hold this split while the  nail was placed.  A lateral parapatellar incision was then made and carried down through skin and subcutaneous tissue.  I released the retinaculum lateral to the patellar tendon and inferior pole the patella.  I  directed a threaded guidewire at the appropriate starting point and advanced into the proximal metaphysis.  I used an entry reamer to enter the medullary canal.  I then passed a bent ball-tipped guidewire down the center of the canal and seated it into the distal metaphysis.  I then measured the length of the nail and chose to use a 330 mm nail.  I then sequentially reamed from 8.5 mm to 10 mm.  I only reamed the distal metaphyseal segment up to 9 mm.  I decided to place a 9 mm nail.  The nail was then placed down the center of the canal.  Was seated until it was appropriately positioned in the distal segment.  Perfect circle technique was used to place one medial to lateral distal interlock and a oblique distal interlock.  The targeting arm was used to place 2 proximal interlocking screws to complete the construct.  The targeting arm was then removed.  Final fluoroscopic imaging was obtained.  The incisions were copiously irrigated.  A gram of vancomycin powder was placed into the incisions.  Layered closure of 0 Vicryl, 2-0 Vicryl 3-0 Monocryl and Steri-Strips were used to close the skin.  Sterile dressing was applied.  The patient was then placed into a boot.  She was then awoken from anesthesia and taken to the PACU in stable condition.  Post Op Plan/Instructions: Patient will be nonweightbearing to the right lower extremity.  She will receive postoperative Ancef.  She will receive Lovenox for DVT prophylaxis while in the hospital and be discharged on aspirin 325 mg twice daily.  We will have her mobilize with physical therapy while in the hospital.  I was present and performed the entire surgery.  Ulyses Southward, PA-C did assist me throughout the case. An assistant was necessary given the difficulty in approach, maintenance of reduction and ability to instrument the fracture.   Truitt Merle, MD [facility]

## 2021-01-07 NOTE — Progress Notes (Signed)
Orthopedic Tech Progress Note Patient Details:  Sierra Glover Apr 21, 1956 [id number] Left at OR desk  Ortho Devices Type of Ortho Device: CAM walker Ortho Device/Splint Interventions: Ordered      Bella Kennedy A Jennalee Greaves 01/07/2021, 8:52 AM

## 2021-01-07 NOTE — Anesthesia Preprocedure Evaluation (Addendum)
Anesthesia Evaluation  Patient identified by MRN, date of birth, ID band Patient awake    Reviewed: Allergy & Precautions, NPO status , Patient's Chart, lab work & pertinent test results  Airway Mallampati: II  TM Distance: >3 FB     Dental   Pulmonary    breath sounds clear to auscultation       Cardiovascular hypertension,  Rhythm:Regular Rate:Normal     Neuro/Psych    GI/Hepatic negative GI ROS, Neg liver ROS,   Endo/Other  negative endocrine ROS  Renal/GU negative Renal ROS     Musculoskeletal   Abdominal   Peds  Hematology   Anesthesia Other Findings   Reproductive/Obstetrics                             Anesthesia Physical Anesthesia Plan  ASA: 2  Anesthesia Plan: General   Post-op Pain Management:    Induction: Intravenous  PONV Risk Score and Plan: 3 and Ondansetron, Dexamethasone and Midazolam  Airway Management Planned: Oral ETT  Additional Equipment:   Intra-op Plan:   Post-operative Plan: Extubation in OR  Informed Consent:     Dental advisory given  Plan Discussed with: CRNA and Anesthesiologist  Anesthesia Plan Comments:         Anesthesia Quick Evaluation

## 2021-01-07 NOTE — Anesthesia Postprocedure Evaluation (Signed)
Anesthesia Post Note  Patient: Sierra Glover  Procedure(s) Performed: INTRAMEDULLARY (IM) NAIL TIBIAL (Right)     Patient location during evaluation: PACU Anesthesia Type: General Level of consciousness: awake Pain management: pain level controlled Vital Signs Assessment: post-procedure vital signs reviewed and stable Respiratory status: spontaneous breathing Cardiovascular status: stable Postop Assessment: no apparent nausea or vomiting Anesthetic complications: no   No notable events documented.  Last Vitals:  Vitals:   01/07/21 0930 01/07/21 0945  BP: (!) 148/81 (!) 150/115  Pulse: 75 84  Resp: 14 14  Temp:    SpO2: 100% 97%    Last Pain:  Vitals:   01/07/21 0945  TempSrc:   PainSc: 5                  Mabeline Varas

## 2021-01-07 NOTE — Anesthesia Procedure Notes (Signed)

## 2021-01-07 NOTE — Discharge Instructions (Signed)
[facility] Discharge Instructions   General Discharge Instructions  WEIGHT BEARING STATUS:non-weightbearing right lower extremity  RANGE OF MOTION/ACTIVITY: Ok for knee motion as tolerated. You may remove boot to begin gentle ankle motion as tolerated at rest. Please have boot on when moving around  Wound Care:You may remove your surgical dressing on/after post op day #2 (Sunday 01/09/21). Leave yellow steri-strips in place, they will fall off on their own. Incisions can be left open to air if there is no drainage. If incision continues to have drainage, follow wound care instructions below. Okay to shower if no drainage from incisions.  DVT/PE prophylaxis: Aspirin 325 mg twice daily x 30 days  Diet: as you were eating previously.  Can use over the counter stool softeners and bowel preparations, such as Miralax, to help with bowel movements.  Narcotics can be constipating.  Be sure to drink plenty of fluids  PAIN MEDICATION USE AND EXPECTATIONS  You have likely been given narcotic medications to help control your pain.  After a traumatic event that results in an fracture (broken bone) with or without surgery, it is ok to use narcotic pain medications to help control one's pain.  We understand that everyone responds to pain differently and each individual patient will be evaluated on a regular basis for the continued need for narcotic medications. Ideally, narcotic medication use should last no more than 6-8 weeks (coinciding with fracture healing).   As a patient it is your responsibility as well to monitor narcotic medication use and report the amount and frequency you use these medications when you come to your office visit.   We would also advise that if you are using narcotic medications, you should take a dose prior to therapy to maximize you participation.  IF YOU ARE ON NARCOTIC MEDICATIONS IT IS NOT PERMISSIBLE TO OPERATE A MOTOR VEHICLE (MOTORCYCLE/CAR/TRUCK/MOPED) OR  HEAVY MACHINERY DO NOT MIX NARCOTICS WITH OTHER CNS (CENTRAL NERVOUS SYSTEM) DEPRESSANTS SUCH AS ALCOHOL   STOP SMOKING OR USING NICOTINE PRODUCTS!!!!  As discussed nicotine severely impairs your body's ability to heal surgical and traumatic wounds but also impairs bone healing.  Wounds and bone heal by forming microscopic blood vessels (angiogenesis) and nicotine is a vasoconstrictor (essentially, shrinks blood vessels).  Therefore, if vasoconstriction occurs to these microscopic blood vessels they essentially disappear and are unable to deliver necessary nutrients to the healing tissue.  This is one modifiable factor that you can do to dramatically increase your chances of healing your injury.    (This means no smoking, no nicotine gum, patches, etc)  DO NOT USE NONSTEROIDAL ANTI-INFLAMMATORY DRUGS (NSAID'S)  Using products such as Advil (ibuprofen), Aleve (naproxen), Motrin (ibuprofen) for additional pain control during fracture healing can delay and/or prevent the healing response.  If you would like to take over the counter (OTC) medication, Tylenol (acetaminophen) is ok.  However, some narcotic medications that are given for pain control contain acetaminophen as well. Therefore, you should not exceed more than 4000 mg of tylenol in a day if you do not have liver disease.  Also note that there are may OTC medicines, such as cold medicines and allergy medicines that my contain tylenol as well.  If you have any questions about medications and/or interactions please ask your doctor/PA or your pharmacist.      ICE AND ELEVATE INJURED/OPERATIVE EXTREMITY  Using ice and elevating the injured extremity above your heart can help with swelling and pain control.  Icing in a pulsatile fashion,  such as 20 minutes on and 20 minutes off, can be followed.    Do not place ice directly on skin. Make sure there is a barrier between to skin and the ice pack.    Using frozen items such as frozen peas works well as  the conform nicely to the are that needs to be iced.  USE AN ACE WRAP OR TED HOSE FOR SWELLING CONTROL  In addition to icing and elevation, Ace wraps or TED hose are used to help limit and resolve swelling.  It is recommended to use Ace wraps or TED hose until you are informed to stop.    When using Ace Wraps start the wrapping distally (farthest away from the body) and wrap proximally (closer to the body)   Example: If you had surgery on your leg or thing and you do not have a splint on, start the ace wrap at the toes and work your way up to the thigh        If you had surgery on your upper extremity and do not have a splint on, start the ace wrap at your fingers and work your way up to the upper arm  IF YOU ARE IN A CAM BOOT (BLACK BOOT)  You may remove boot periodically. Perform daily dressing changes as noted below.  Wash the liner of the boot regularly and wear a sock when wearing the boot. It is recommended that you sleep in the boot until told otherwise   CALL THE OFFICE WITH ANY QUESTIONS OR CONCERNS: [phone]   VISIT OUR WEBSITE FOR ADDITIONAL INFORMATION: [email or web address]    Discharge Wound Care Instructions  Do NOT apply any ointments, solutions or lotions to pin sites or surgical wounds.  These prevent needed drainage and even though solutions like hydrogen peroxide kill bacteria, they also damage cells lining the pin sites that help fight infection.  Applying lotions or ointments can keep the wounds moist and can cause them to breakdown and open up as well. This can increase the risk for infection. When in doubt call the office.  If any drainage is noted, use one layer of adaptic, then gauze, Kerlix, and an ace wrap.  Once the incision is completely dry and without drainage, it may be left open to air out.  Showering may begin 36-48 hours later.  Cleaning gently with soap and water.

## 2021-01-07 NOTE — TOC Progression Note (Signed)
Transition of Care [facility]) - Progression Note    Patient Details  Name: Sierra Glover MRN: [id number] Date of Birth: 10-Sep-1955  Transition of Care [facility]) CM/SW Contact  Ralene Bathe, LCSWA Phone Number: 01/07/2021, 4:26 PM  Clinical Narrative:    TOC following patient for any d/c planning needs once medically stable.  Cleon Gustin, MSW, LCSWA         Expected Discharge Plan and Services                                                 Social Determinants of Health (SDOH) Interventions    Readmission Risk Interventions No flowsheet data found.

## 2021-01-07 NOTE — Transfer of Care (Signed)
Immediate Anesthesia Transfer of Care Note  Patient: Sierra Glover  Procedure(s) Performed: INTRAMEDULLARY (IM) NAIL TIBIAL (Right)  Patient Location: PACU  Anesthesia Type:General  Level of Consciousness: drowsy, patient cooperative and responds to stimulation  Airway & Oxygen Therapy: Patient Spontanous Breathing and Patient connected to nasal cannula oxygen  Post-op Assessment: Report given to RN, Post -op Vital signs reviewed and stable and Patient moving all extremities X 4  Post vital signs: Reviewed and stable  Last Vitals:  Vitals Value Taken Time  BP 138/87 01/07/21 0914  Temp    Pulse 74 01/07/21 0917  Resp 16 01/07/21 0917  SpO2 99 % 01/07/21 0917  Vitals shown include unvalidated device data.  Last Pain:  Vitals:   01/07/21 5621  TempSrc:   PainSc: 0-No pain         Complications: No notable events documented.

## 2021-01-07 NOTE — H&P (Signed)
Orthopaedic Trauma Service (OTS) H&P   Patient ID: Sierra Glover MRN: [id number] DOB/AGE: 09-10-1955 65 y.o.  Reason for Consult:Right distal tibia fracture Referring Physician: Dr. Fuller Canada, MD Cyndia Skeeters  HPI: Sierra Glover is an 65 y.o. female who is being seen in consultation at the request of Dr. Romeo Apple for evaluation of right distal tibia fracture.  Patient was in her garden when she slipped fell fracturing her tibia.  Presents emergency room where x-rays showed a extra-articular distal tibia fracture.  Due to the complex nature of her injury Dr. Romeo Apple felt that this was outside the scope of practice and recommended treatment by an orthopedic traumatologist.  Patient was seen and evaluated in the preoperative holding area.  I discussed her care over the phone with her yesterday.  Patient is currently comfortable denies any numbness or tingling.  She is very active spending a lot of time in her garden as well as walking multiple miles every day.  Lives at home with her husband.  Works from home.  Denies any tobacco use.  Ambulates without assist device.  Denies any other injuries.  Denies any major medical problems other than hypertension.  Denies any diabetes.  Past Medical History:  Diagnosis Date   Anemia    before my hysterectomy   DVT (deep venous thrombosis) (HCC)    "Many, many years ago."   HTN (hypertension)     Past Surgical History:  Procedure Laterality Date   COLONOSCOPY     partal hysterectomy  2009    Family History  Problem Relation Age of Onset   Alzheimer's disease Mother    Hypertension Mother    Hypertension Father    COPD Father    Hypertension Other     Social History:  reports that she has never smoked. She has never used smokeless tobacco. She reports current alcohol use. She reports that she does not use drugs.  Allergies: No Known Allergies  Medications:  No current facility-administered medications on file prior to encounter.    Current Outpatient Medications on File Prior to Encounter  Medication Sig Dispense Refill   atorvastatin (LIPITOR) 20 MG tablet Take 1 tablet (20 mg total) by mouth daily. 30 tablet 11   ciclopirox (PENLAC) 8 % solution Apply topically at bedtime. Apply over nail and surrounding skin. Apply daily over previous coat. After seven (7) days, may remove with alcohol and continue cycle. (Patient taking differently: Apply 1 application topically See admin instructions. Apply over nail and surrounding skin. Apply daily over previous coat. After seven (7) days, may remove with alcohol and continue cycle. If need) 6.6 mL 0   fexofenadine (ALLEGRA ALLERGY) 180 MG tablet Take 1 tablet (180 mg total) by mouth daily. (Patient taking differently: Take 180 mg by mouth daily as needed for allergies.) 90 tablet 1   fluticasone (FLONASE) 50 MCG/ACT nasal spray SPRAY 1 SPRAY INTO EACH NOSTRIL EVERY DAY (Patient taking differently: Place 2 sprays into both nostrils daily as needed for allergies or rhinitis.) 48 mL 1   meclizine (ANTIVERT) 25 MG tablet Take 1 tablet (25 mg total) by mouth 2 (two) times daily as needed for dizziness. 30 tablet 0   oxyCODONE-acetaminophen (PERCOCET) 5-325 MG tablet Take 1 tablet by mouth every 4 (four) hours as needed for severe pain. (Patient taking differently: Take 1 tablet by mouth 2 (two) times daily.) 20 tablet 0   triamterene-hydrochlorothiazide (MAXZIDE-25) 37.5-25 MG tablet Take 1 tablet by mouth daily. 90 tablet 2   valACYclovir (  VALTREX) 500 MG tablet take 1 tablet by oral route  every day (Patient taking differently: Take 500 mg by mouth daily as needed (flair-up).) 90 tablet 0     ROS: Constitutional: No fever or chills Vision: No changes in vision ENT: No difficulty swallowing CV: No chest pain Pulm: No SOB or wheezing GI: No nausea or vomiting GU: No urgency or inability to hold urine Skin: No poor wound healing Neurologic: No numbness or tingling Psychiatric: No  depression or anxiety Heme: No bruising Allergic: No reaction to medications or food   Exam: Blood pressure (!) 159/90, pulse 87, temperature 98.1 F (36.7 C), temperature source Oral, resp. rate 17, SpO2 98 %. General: No acute distress Orientation: Awake alert and oriented Mood and Affect: Cooperative and pleasant Gait: Unable to assess due to her fracture Coordination and balance: Within normal limits  Right lower extremity: Splint is in place.  Is clean dry and intact.  Compartments are soft compressible.  No pain or deformity about the knee.  Active dorsiflexion plantarflexion of his toes.  Sensation intact to light touch to the dorsum and plantar aspect of her foot.  Brisk cap refill of less than 2 seconds.  Left lower extremity: Skin without lesions. No tenderness to palpation. Full painless ROM, full strength in each muscle groups without evidence of instability.   Medical Decision Making: Data: Imaging: X-rays of the right ankle are reviewed which shows a extra-articular distal tibia fracture.  Labs:  Results for orders placed or performed during the hospital encounter of 01/07/21 (from the past 24 hour(s))  SARS Coronavirus 2 by RT PCR (hospital order, performed in [facility] hospital lab) Nasopharyngeal Nasopharyngeal Swab     Status: None   Collection Time: 01/07/21  5:57 AM   Specimen: Nasopharyngeal Swab  Result Value Ref Range   SARS Coronavirus 2 NEGATIVE NEGATIVE  Basic metabolic panel per protocol     Status: Abnormal   Collection Time: 01/07/21  6:28 AM  Result Value Ref Range   Sodium 139 135 - 145 mmol/L   Potassium 2.8 (L) 3.5 - 5.1 mmol/L   Chloride 98 98 - 111 mmol/L   CO2 29 22 - 32 mmol/L   Glucose, Bld 132 (H) 70 - 99 mg/dL   BUN 18 8 - 23 mg/dL   Creatinine, Ser 9.47 0.44 - 1.00 mg/dL   Calcium 09.6 8.9 - 28.3 mg/dL   GFR, Estimated >66 >29 mL/min   Anion gap 12 5 - 15  CBC per protocol     Status: None   Collection Time: 01/07/21  6:28 AM   Result Value Ref Range   WBC 9.5 4.0 - 10.5 K/uL   RBC 4.67 3.87 - 5.11 MIL/uL   Hemoglobin 13.4 12.0 - 15.0 g/dL   HCT 47.6 54.6 - 50.3 %   MCV 87.4 80.0 - 100.0 fL   MCH 28.7 26.0 - 34.0 pg   MCHC 32.8 30.0 - 36.0 g/dL   RDW 54.6 56.8 - 12.7 %   Platelets 301 150 - 400 K/uL   nRBC 0.0 0.0 - 0.2 %     Imaging or Labs ordered: None  Medical history and chart was reviewed and case discussed with medical provider.  Assessment/Plan: 65 year old female with a left extra-articular distal tibia fracture.  Due to the unstable nature of her injury I recommended proceeding with intramedullary nailing of her right tibia.  Risks and benefits were discussed with the patient.  Risks included but not limited to bleeding,  infection, nerve or blood vessel injury, DVT, ankle stiffness, posttraumatic arthritis, even the possibility anesthetic complications.  Patient agreed to proceed with surgery consent was obtained.   Roby Lofts, MD [facility] [phone] (office) [email or web address]

## 2021-01-07 NOTE — Evaluation (Signed)
Physical Therapy Evaluation Patient Details Name: Sierra Glover MRN: [id number] DOB: 1955/12/18 Today's Date: 01/07/2021   History of Present Illness  Pt is a 65 y.o. female who presented 01/07/21 for s/p IM nailing of R tibia fx and percutaneous fixation of R medial malleolus fx in which she sustained a R extra-articular distal tibia fracture when she slipped and fell in her garden 01/01/21. PMH: DVT, anemia, HTN.   Clinical Impression  Pt presents with condition above and deficits mentioned below, see PT Problem List. Prior to her fx, she was independent, working from home, and living with her husband and 66 y.o. granddaughter (home until college) in a house with 2 STE without rails. Currently, pt is displaying deficits in activity tolerance and balance. Pt was able to ambulate up to ~30 ft with a RW and min guard-supervision this date before she became lightheaded with SpO2 reading 86%, rebounded to 90s% with donning of [place] at 1L. Pt displays good compliance with NWB on her R lower extremity. Will continue to follow acutely. Recommend follow-up with Outpatient PT once cleared by MD for lower extremity ROM/weight bearing.    Follow Up Recommendations Outpatient PT;Supervision for mobility/OOB (once cleared by MD for lower extremity ROM/weight bearing)    Equipment Recommendations  Rolling walker with 5" wheels;3in1 (PT)    Recommendations for Other Services       Precautions / Restrictions Precautions Precautions: Fall Restrictions Weight Bearing Restrictions: Yes RLE Weight Bearing: Non weight bearing Other Position/Activity Restrictions: CAM boot      Mobility  Bed Mobility Overal bed mobility: Independent             General bed mobility comments: Pt able to transition supine <> sit EOB safely with bed flat.    Transfers Overall transfer level: Needs assistance Equipment used: Rolling walker (2 wheeled) Transfers: Sit to/from Stand Sit to Stand: Min guard          General transfer comment: Min guard assist for safety, no LOB. Cues for hand placement and foot placement.  Ambulation/Gait Ambulation/Gait assistance: Min guard;Supervision Gait Distance (Feet): 30 Feet Assistive device: Rolling walker (2 wheeled) Gait Pattern/deviations:  (hop-to) Gait velocity: reduced Gait velocity interpretation: 1.31 - 2.62 ft/sec, indicative of limited community ambulator General Gait Details: Pt with heavy reliance on UEs on RW with hop-to gait within room. L tennis shoe donned for gait to assist with keeping R foot off ground. No LOB, min guard-supervision for safety, but pt did report feeling "woozy" towards the end thus returned to bed for safety, SpO2 reading 86%, BP appeared within her reported normal, no nystagmus noted  Stairs            Wheelchair Mobility    Modified Rankin (Stroke Patients Only)       Balance Overall balance assessment: Needs assistance Sitting-balance support: No upper extremity supported;Feet supported Sitting balance-Leahy Scale: Normal Sitting balance - Comments: Able to reach off BOS to donn/doff socks/shoes.   Standing balance support: Bilateral upper extremity supported;Single extremity supported;During functional activity Standing balance-Leahy Scale: Poor Standing balance comment: Reliant on 1-2 UE support for stability while maintaining NWB on R lower extremity                             Pertinent Vitals/Pain Pain Assessment: 0-10 Pain Score: 5  Pain Location: R leg Pain Descriptors / Indicators: Discomfort;Guarding;Grimacing;Operative site guarding;Throbbing Pain Intervention(s): Limited activity within patient's tolerance;Monitored during session;Premedicated before session;Repositioned  Home Living Family/patient expects to be discharged to:: Private residence Living Arrangements: Spouse/significant other;Other relatives (34 y.o. 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     Hand Dominance   Dominant Hand: Right    Extremity/Trunk Assessment   Upper Extremity Assessment Upper Extremity Assessment: Overall WFL for tasks assessed    Lower Extremity Assessment Lower Extremity Assessment: RLE deficits/detail RLE Deficits / Details: Limited ankle mobility s/p surgery; edema noted distally; denies numbness/tingling distally; WFL strength with mobility RLE Sensation: WNL RLE Coordination: WNL    Cervical / Trunk Assessment Cervical / Trunk Assessment: Normal  Communication   Communication: No difficulties  Cognition Arousal/Alertness: Awake/alert Behavior During Therapy: WFL for tasks assessed/performed Overall Cognitive Status: Within Functional Limits for tasks assessed                                 General Comments: A&Ox4.      General Comments General comments (skin integrity, edema, etc.): SpO2 as low as 86% after gait, rebounded with 1 L O2 via [place] to 90s%; BP WNL of pt's reported norm; educated pt to keep R lower extremity elevated and wiggle toes and perform L ankle pumps for edema management and reduce risk for DVT    Exercises     Assessment/Plan    PT Assessment Patient needs continued PT services  PT Problem List Decreased range of motion;Decreased activity tolerance;Decreased balance;Decreased mobility;Decreased knowledge of use of DME;Decreased safety awareness;Decreased knowledge of precautions;Pain       PT Treatment Interventions DME instruction;Gait training;Stair  training;Functional mobility training;Therapeutic activities;Therapeutic exercise;Balance training;Neuromuscular re-education;Patient/family education    PT Goals (Current goals can be found in the Care Plan section)  Acute Rehab PT Goals Patient Stated Goal: to get better PT Goal Formulation: With patient Time For Goal Achievement: 01/14/21 Potential to Achieve Goals: Good    Frequency Min 5X/week   Barriers to discharge        Co-evaluation               AM-PAC PT "6 Clicks" Mobility  Outcome Measure Help needed turning from your back to your side while in a flat bed without using bedrails?: None Help needed moving from lying on your back to sitting on the side of a flat bed without using bedrails?: None Help needed moving to and from a bed to a chair (including a wheelchair)?: A Little Help needed standing up from a chair using your arms (e.g., wheelchair or bedside chair)?: A Little Help needed to walk in hospital room?: A Little Help needed climbing 3-5 steps with a railing? : A Lot 6 Click Score: 19    End of Session Equipment Utilized During Treatment: Gait belt;Oxygen;Other (comment) (CAM boot) Activity Tolerance: Patient tolerated treatment well;Other (comment) (limited by lightheadedness) Patient left: in bed;with call bell/phone within reach;with bed alarm set;with SCD's reapplied Nurse Communication: Mobility status PT Visit Diagnosis: Unsteadiness on feet (R26.81);Other abnormalities of gait and mobility (R26.89);History of falling (Z91.81);Difficulty in walking, not elsewhere classified (R26.2);Pain;Dizziness and giddiness (R42) Pain -  Right/Left: Right Pain - part of body: Ankle and joints of foot    Time: 1536-1616 PT Time Calculation (min) (ACUTE ONLY): 40 min   Charges:   PT Evaluation $PT Eval Low Complexity: 1 Low PT Treatments $Gait Training: 8-22 mins $Therapeutic Activity: 8-22 mins        Raymond Gurney, PT, DPT Acute Rehabilitation  Services  Pager: [phone] Office: [phone]   Jewel Baize 01/07/2021, 5:50 PM

## 2021-01-07 NOTE — Progress Notes (Signed)
Notified Dr. Dorris Singh that patient's K+ is 2.8.No orders at this time.

## 2021-01-08 DIAGNOSIS — S82231A Displaced oblique fracture of shaft of right tibia, initial encounter for closed fracture: Secondary | ICD-10-CM | POA: Diagnosis not present

## 2021-01-08 LAB — CBC
HCT: 32.7 % — ABNORMAL LOW (ref 36.0–46.0)
Hemoglobin: 11 g/dL — ABNORMAL LOW (ref 12.0–15.0)
MCH: 29.3 pg (ref 26.0–34.0)
MCHC: 33.6 g/dL (ref 30.0–36.0)
MCV: 87 fL (ref 80.0–100.0)
Platelets: 275 10*3/uL (ref 150–400)
RBC: 3.76 MIL/uL — ABNORMAL LOW (ref 3.87–5.11)
RDW: 12.1 % (ref 11.5–15.5)
WBC: 10.9 10*3/uL — ABNORMAL HIGH (ref 4.0–10.5)
nRBC: 0 % (ref 0.0–0.2)

## 2021-01-08 LAB — BASIC METABOLIC PANEL
Anion gap: 10 (ref 5–15)
BUN: 14 mg/dL (ref 8–23)
CO2: 28 mmol/L (ref 22–32)
Calcium: 9.4 mg/dL (ref 8.9–10.3)
Chloride: 98 mmol/L (ref 98–111)
Creatinine, Ser: 0.89 mg/dL (ref 0.44–1.00)
GFR, Estimated: >60 mL/min (ref >60)
Glucose, Bld: 113 mg/dL — ABNORMAL HIGH (ref 70–99)
Potassium: 3.5 mmol/L (ref 3.5–5.1)
Sodium: 136 mmol/L (ref 135–145)

## 2021-01-08 NOTE — TOC Transition Note (Signed)
Transition of Care [facility]) - CM/SW Discharge Note   Patient Details  Name: Sierra Glover MRN: [id number] Date of Birth: 12-20-1955  Transition of Care [facility]) CM/SW Contact:  Bess Kinds, RN Phone Number: [phone] 01/08/2021, 2:34 PM   Clinical Narrative:     Spoke with patient at the bedside. Demographics verified. PTA home with husband. Discussed recommendations for [facility] PT/OT. Provided home health agency listing for [place], [place]. Referral accepted by [facility]. Discussed DME recommendations - referral to [employer] for RW and 3-N-1 for delivery to the room. Patient will research her options for tub bench, since this is a private pay amenity. No further TOC needs identified.   Final next level of care: Home w Home Health Services Barriers to Discharge: No Barriers Identified   Patient Goals and CMS Choice Patient states their goals for this hospitalization and ongoing recovery are:: return home with husband CMS Medicare.gov Compare Post Acute Care list provided to:: Patient Choice offered to / list presented to : Patient  Discharge Placement                       Discharge Plan and Services                DME Arranged: 3-N-1, Walker rolling DME Agency: [employer] Date DME Agency Contacted: 01/08/21 Time DME Agency Contacted: 1433 Representative spoke with at DME Agency: Leavy Cella HH Arranged: PT, OT HH Agency: [facility] Date [facility] Agency Contacted: 01/08/21 Time HH Agency Contacted: 1433 Representative spoke with at [facility] Agency: Kandee Keen  Social Determinants of Health (SDOH) Interventions     Readmission Risk Interventions No flowsheet data found.

## 2021-01-08 NOTE — Progress Notes (Signed)
Subjective: 1 Day Post-Op s/p Procedure(s): INTRAMEDULLARY (IM) NAIL TIBIAL   Patient is alert, oriented. Patient reports pain as mild. Denies chest pain, SOB, Calf pain. No nausea/vomiting. No other complaints. On 1L O2 at rest this morning.     Objective:  PE: VITALS:   Vitals:   01/07/21 1300 01/07/21 1530 01/07/21 2104 01/08/21 0741  BP:  (!) 143/91 130/88 135/86  Pulse:  78 83 74  Resp:  16 16 17   Temp:  98.8 F (37.1 C) 99 F (37.2 C) 98.3 F (36.8 C)  TempSrc:  Oral Oral Oral  SpO2: 98% 100% 99% 99%  Weight:   65.8 kg   Height:   5\' 5"  (1.651 m)    General: well appearing female in no acute distress Pulm: On room air, no SOB, no use of accessory musculature GI: soft, non-distended abdomen MSK: RLE boot in place with dressings intact without drainage. Able to flex and extend all toes. Distal sensation intact. + DP pulse. Moderate TTP to right knee. Able to perform straight leg raise.    LABS  Results for orders placed or performed during the hospital encounter of 01/07/21 (from the past 24 hour(s))  VITAMIN D 25 Hydroxy (Vit-D Deficiency, Fractures)     Status: None   Collection Time: 01/07/21  1:09 PM  Result Value Ref Range   Vit D, 25-Hydroxy 30.11 30 - 100 ng/mL  CBC     Status: Abnormal   Collection Time: 01/07/21  1:09 PM  Result Value Ref Range   WBC 12.7 (H) 4.0 - 10.5 K/uL   RBC 4.34 3.87 - 5.11 MIL/uL   Hemoglobin 12.3 12.0 - 15.0 g/dL   HCT 01/09/21 01/09/21 - 64.3 %   MCV 87.8 80.0 - 100.0 fL   MCH 28.3 26.0 - 34.0 pg   MCHC 32.3 30.0 - 36.0 g/dL   RDW 32.9 51.8 - 84.1 %   Platelets 294 150 - 400 K/uL   nRBC 0.0 0.0 - 0.2 %  Creatinine, serum     Status: None   Collection Time: 01/07/21  1:09 PM  Result Value Ref Range   Creatinine, Ser 0.95 0.44 - 1.00 mg/dL   GFR, Estimated 63.0 01/09/21 mL/min  Basic metabolic panel     Status: Abnormal   Collection Time: 01/08/21  3:20 AM  Result Value Ref Range   Sodium 136 135 - 145 mmol/L   Potassium 3.5  3.5 - 5.1 mmol/L   Chloride 98 98 - 111 mmol/L   CO2 28 22 - 32 mmol/L   Glucose, Bld 113 (H) 70 - 99 mg/dL   BUN 14 8 - 23 mg/dL   Creatinine, Ser >01 0.44 - 1.00 mg/dL   Calcium 9.4 8.9 - 01/10/21 mg/dL   GFR, Estimated 0.93 23.5 mL/min   Anion gap 10 5 - 15  CBC     Status: Abnormal   Collection Time: 01/08/21  3:20 AM  Result Value Ref Range   WBC 10.9 (H) 4.0 - 10.5 K/uL   RBC 3.76 (L) 3.87 - 5.11 MIL/uL   Hemoglobin 11.0 (L) 12.0 - 15.0 g/dL   HCT >32 (L) 01/10/21 - 20.2 %   MCV 87.0 80.0 - 100.0 fL   MCH 29.3 26.0 - 34.0 pg   MCHC 33.6 30.0 - 36.0 g/dL   RDW 54.2 70.6 - 23.7 %   Platelets 275 150 - 400 K/uL   nRBC 0.0 0.0 - 0.2 %    DG Tibia/Fibula Right  Result Date: 01/07/2021 CLINICAL DATA:  Right tibial nail. EXAM: RIGHT TIBIA AND FIBULA - 2 VIEW; DG C-ARM 1-60 MIN COMPARISON:  Preoperative radiograph 01/01/2021 FINDINGS: Nine fluoroscopic spot views of the right tibia and fibula obtained in the operating room. Intramedullary nail with proximal and distal locking screws traverse distal tibial shaft fracture. There is an oblique mildly displaced proximal fibular fracture that was not included on the ankle exam. Fluoroscopy time 2 minutes 19 seconds. Dose 3.61 mGy. IMPRESSION: 1. Intraoperative fluoroscopy during right tibial intramedullary nail fixation of distal tibial fracture. No visualized complication. 2. Displaced proximal fibular fracture, not included on recent ankle exam. Electronically Signed   By: Narda Rutherford M.D.   On: 01/07/2021 09:51   DG Tibia/Fibula Right Port  Result Date: 01/07/2021 CLINICAL DATA:  Status post right tibial intramedullary rod fixation. EXAM: PORTABLE RIGHT TIBIA AND FIBULA - 2 VIEW COMPARISON:  Fluoroscopic images of same day. FINDINGS: Status post intramedullary rod fixation of distal right tibial fracture. Improved alignment of fracture components is noted. Mildly displaced proximal right fibular fracture is again noted. No soft tissue  abnormality is noted. IMPRESSION: Status post intramedullary rod fixation of distal right tibial fracture. Electronically Signed   By: Lupita Raider M.D.   On: 01/07/2021 12:15   DG C-Arm 1-60 Min  Result Date: 01/07/2021 CLINICAL DATA:  Right tibial nail. EXAM: RIGHT TIBIA AND FIBULA - 2 VIEW; DG C-ARM 1-60 MIN COMPARISON:  Preoperative radiograph 01/01/2021 FINDINGS: Nine fluoroscopic spot views of the right tibia and fibula obtained in the operating room. Intramedullary nail with proximal and distal locking screws traverse distal tibial shaft fracture. There is an oblique mildly displaced proximal fibular fracture that was not included on the ankle exam. Fluoroscopy time 2 minutes 19 seconds. Dose 3.61 mGy. IMPRESSION: 1. Intraoperative fluoroscopy during right tibial intramedullary nail fixation of distal tibial fracture. No visualized complication. 2. Displaced proximal fibular fracture, not included on recent ankle exam. Electronically Signed   By: Narda Rutherford M.D.   On: 01/07/2021 09:51    Assessment/Plan: Principal Problem:   Closed displaced oblique fracture of shaft of right tibia Active Problems:   Closed fracture of right distal tibia  1 Day Post-Op s/p Procedure(s): INTRAMEDULLARY (IM) NAIL TIBIAL - continue with PT session today, if able to mobilize well without dizziness may be able to discharge home this afternoon, ordered O2 sats q 2 hours to make sure she is oxygenating well on room air this morning prior to discharge  Weightbearing: NWB RLE Insicional and dressing care: reinforce as needed VTE prophylaxis: lovenox while inpatient Pain control: continue current regimen, pain well controlled Follow - up plan: Dr. Jena Gauss Dispo: may discharge this afternoon if able to ambulate with PT well today  Contact information:   Weekdays 8-5 Janine Ores, PA-C [phone] A fter hours and holidays please check [email or web address] for group call information for [facility]  Armida Sans 01/08/2021, 8:32 AM

## 2021-01-08 NOTE — Care Management Obs Status (Signed)
MEDICARE OBSERVATION STATUS NOTIFICATION   Patient Details  Name: Sierra Glover MRN: [id number] Date of Birth: 08-02-1955   Medicare Observation Status Notification Given:  Yes    Bess Kinds, RN 01/08/2021, 2:29 PM

## 2021-01-08 NOTE — Progress Notes (Signed)
Physical Therapy Treatment Patient Details Name: Sierra Glover MRN: [id number] DOB: 1955-06-26 Today's Date: 01/08/2021    History of Present Illness Pt is a 65 y.o. female who presented 01/07/21 for s/p IM nailing of R tibia fx and percutaneous fixation of R medial malleolus fx in which she sustained a R extra-articular distal tibia fracture when she slipped and fell in her garden 01/01/21. PMH: DVT, anemia, HTN.    PT Comments    Session focused on gait and stair training. Patient overall functioning at supervision level with Rw and good ability to maintain NWB throughout mobility. Educated patient and husband on use of chair at top of 2 steps to avoid hopping up/down stairs due to patient feeling unsafe with mobility, both verbalized understanding. Continue to recommend OPPT once cleared by MD for weightbearing and ROM.    Follow Up Recommendations  Outpatient PT;Supervision for mobility/OOB (once cleared by MD for lower extremity ROM/weight bearing)     Equipment Recommendations  Rolling Tabrina Esty with 5" wheels;3in1 (PT)    Recommendations for Other Services       Precautions / Restrictions Precautions Precautions: Fall Restrictions Weight Bearing Restrictions: Yes RLE Weight Bearing: Non weight bearing Other Position/Activity Restrictions: CAM boot    Mobility  Bed Mobility Overal bed mobility: Independent             General bed mobility comments: Pt able to transition supine <> sit EOB safely with bed flat.    Transfers Overall transfer level: Needs assistance Equipment used: Rolling Hailey Miles (2 wheeled) Transfers: Sit to/from Stand Sit to Stand: Supervision         General transfer comment: supervision for safety. Able to maintain NWB during transition  Ambulation/Gait Ambulation/Gait assistance: Supervision Gait Distance (Feet): 20 Feet (x 10') Assistive device: Rolling Kord Monette (2 wheeled) Gait Pattern/deviations:  (hop to) Gait velocity: decreased    General Gait Details: Hop to gait pattern with good ability to maintain NWB throughout mobility with use of RW.   Stairs Stairs: Yes       General stair comments: Discussed use of chair at top of 2 stairs in home to avoid hopping up/down stairs due to instability. Patient and husband in agreement of safest option for stair negotiation   Wheelchair Mobility    Modified Rankin (Stroke Patients Only)       Balance Overall balance assessment: Needs assistance Sitting-balance support: No upper extremity supported;Feet supported Sitting balance-Leahy Scale: Normal Sitting balance - Comments: Able to reach off BOS to donn/doff socks/shoes.   Standing balance support: Bilateral upper extremity supported;Single extremity supported;During functional activity Standing balance-Leahy Scale: Poor Standing balance comment: Reliant on 1-2 UE support for stability while maintaining NWB on R lower extremity                            Cognition Arousal/Alertness: Awake/alert Behavior During Therapy: WFL for tasks assessed/performed Overall Cognitive Status: Within Functional Limits for tasks assessed                                 General Comments: A&Ox4.      Exercises      General Comments General comments (skin integrity, edema, etc.): VSS on RA, O2 97% at end of ambulation      Pertinent Vitals/Pain Pain Assessment: Faces Pain Score: 4  Faces Pain Scale: Hurts little more Pain Location: R leg Pain Descriptors /  Indicators: Discomfort;Guarding;Grimacing;Operative site guarding;Throbbing Pain Intervention(s): Limited activity within patient's tolerance;Monitored during session;Repositioned    Home Living Family/patient expects to be discharged to:: Private residence Living Arrangements: Spouse/significant other;Other relatives Available Help at Discharge: Family;Available 24 hours/day Type of Home: House Home Access: Stairs to enter Entrance  Stairs-Rails: None Home Layout: Two level;Able to live on main level with bedroom/bathroom Home Equipment: Crutches;Toilet riser;Grab bars - toilet;Transport chair      Prior Function Level of Independence: Independent      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   PT Goals (current goals can now be found in the care plan section) Acute Rehab PT Goals Patient Stated Goal: to get better PT Goal Formulation: With patient Time For Goal Achievement: 01/14/21 Potential to Achieve Goals: Good Progress towards PT goals: Progressing toward goals    Frequency    Min 5X/week      PT Plan Current plan remains appropriate    Co-evaluation              AM-PAC PT "6 Clicks" Mobility   Outcome Measure  Help needed turning from your back to your side while in a flat bed without using bedrails?: None Help needed moving from lying on your back to sitting on the side of a flat bed without using bedrails?: None Help needed moving to and from a bed to a chair (including a wheelchair)?: A Little Help needed standing up from a chair using your arms (e.g., wheelchair or bedside chair)?: A Little Help needed to walk in hospital room?: A Little Help needed climbing 3-5 steps with a railing? : A Little 6 Click Score: 20    End of Session Equipment Utilized During Treatment: Gait belt;Other (comment) (CAM boot) Activity Tolerance: Patient tolerated treatment well Patient left: in chair;with call bell/phone within reach;with family/visitor present Nurse Communication: Mobility status PT Visit Diagnosis: Unsteadiness on feet (R26.81);Other abnormalities of gait and mobility (R26.89);History of falling (Z91.81);Difficulty in walking, not elsewhere classified (R26.2);Pain;Dizziness and giddiness (R42) Pain - Right/Left: Right Pain - part of body: Ankle and joints of foot     Time: 5631-4970 PT Time Calculation (min) (ACUTE ONLY): 28 min  Charges:  $Gait  Training: 8-22 mins $Therapeutic Activity: 8-22 mins                     Antonious Omahoney A. Dan Humphreys PT, DPT Acute Rehabilitation Services Pager [phone] Office [phone]    Viviann Spare 01/08/2021, 1:33 PM

## 2021-01-08 NOTE — Discharge Summary (Signed)
Discharge Summary  Patient ID: Sierra Glover MRN: [id number] DOB/AGE: 07-29-1955 65 y.o.  Admit date: 01/07/2021 Discharge date: 01/08/2021  Admission Diagnoses:  Closed displaced oblique fracture of shaft of right tibia  Discharge Diagnoses:  Principal Problem:   Closed displaced oblique fracture of shaft of right tibia Active Problems:   Closed fracture of right distal tibia   Past Medical History:  Diagnosis Date   Anemia    before my hysterectomy   DVT (deep venous thrombosis) (HCC)    "Many, many years ago."   HTN (hypertension)     Surgeries: Procedure(s): INTRAMEDULLARY (IM) NAIL TIBIAL on 01/07/2021   Consultants (if any):   Discharged Condition: Improved  Hospital Course: Sierra Glover is an 65 y.o. female who was admitted 01/07/2021 with a diagnosis of Closed displaced oblique fracture of shaft of right tibia and went to the operating room on 01/07/2021 and underwent the above named procedures.    She was given perioperative antibiotics:  Anti-infectives (From admission, onward)    Start     Dose/Rate Route Frequency Ordered Stop   01/07/21 1545  ceFAZolin (ANCEF) IVPB 2g/100 mL premix        2 g 200 mL/hr over 30 Minutes Intravenous Every 8 hours 01/07/21 1249 01/08/21 1047   01/07/21 0757  vancomycin (VANCOCIN) powder  Status:  Discontinued          As needed 01/07/21 0758 01/07/21 0908   01/07/21 0615  ceFAZolin (ANCEF) IVPB 2g/100 mL premix        2 g 200 mL/hr over 30 Minutes Intravenous On call to O.R. 01/07/21 0602 01/07/21 0751     .  She was given sequential compression devices, early ambulation, and Lovenox while inpatient for DVT prophylaxis.  She benefited maximally from the hospital stay and there were no complications.    Recent vital signs:  Vitals:   01/07/21 2104 01/08/21 0741  BP: 130/88 135/86  Pulse: 83 74  Resp: 16 17  Temp: 99 F (37.2 C) 98.3 F (36.8 C)  SpO2: 99% 99%    Recent laboratory studies:  Lab Results   Component Value Date   HGB 11.0 (L) 01/08/2021   HGB 12.3 01/07/2021   HGB 13.4 01/07/2021   Lab Results  Component Value Date   WBC 10.9 (H) 01/08/2021   PLT 275 01/08/2021   No results found for: INR Lab Results  Component Value Date   NA 136 01/08/2021   K 3.5 01/08/2021   CL 98 01/08/2021   CO2 28 01/08/2021   BUN 14 01/08/2021   CREATININE 0.89 01/08/2021   GLUCOSE 113 (H) 01/08/2021    Discharge Medications:   Allergies as of 01/08/2021   No Known Allergies      Medication List     TAKE these medications    aspirin EC 325 MG tablet Take 1 tablet (325 mg total) by mouth in the morning and at bedtime.   atorvastatin 20 MG tablet Commonly known as: Lipitor Take 1 tablet (20 mg total) by mouth daily.   docusate sodium 100 MG capsule Commonly known as: COLACE Take 1 capsule (100 mg total) by mouth 2 (two) times daily.   meclizine 25 MG tablet Commonly known as: ANTIVERT Take 1 tablet (25 mg total) by mouth 2 (two) times daily as needed for dizziness.   oxyCODONE-acetaminophen 5-325 MG tablet Commonly known as: PERCOCET/ROXICET Take 1-2 tablets by mouth every 4 (four) hours as needed for moderate pain or severe pain. What  changed:  how much to take reasons to take this   triamterene-hydrochlorothiazide 37.5-25 MG tablet Commonly known as: MAXZIDE-25 Take 1 tablet by mouth daily.   valACYclovir 500 MG tablet Commonly known as: Valtrex take 1 tablet by oral route  every day What changed:  how much to take how to take this when to take this reasons to take this additional instructions       ASK your doctor about these medications    ciclopirox 8 % solution Commonly known as: Penlac Apply topically at bedtime. Apply over nail and surrounding skin. Apply daily over previous coat. After seven (7) days, may remove with alcohol and continue cycle.   fexofenadine 180 MG tablet Commonly known as: Allegra Allergy Take 1 tablet (180 mg total) by  mouth daily.   fluticasone 50 MCG/ACT nasal spray Commonly known as: FLONASE SPRAY 1 SPRAY INTO EACH NOSTRIL EVERY DAY               Durable Medical Equipment  (From admission, onward)           Start     Ordered   01/08/21 1357  For home use only DME 3 n 1  Once        01/08/21 1356   01/08/21 1356  For home use only DME Walker rolling  Once       Question Answer Comment  Walker: With 5 Inch Wheels   Patient needs a walker to treat with the following condition Right tibial fracture      01/08/21 1355   01/08/21 1356  For home use only DME Other see comment  Once       Comments: Shower seat  Question:  Length of Need  Answer:  6 Months   01/08/21 1355            Diagnostic Studies: DG Tibia/Fibula Right  Result Date: 01/07/2021 CLINICAL DATA:  Right tibial nail. EXAM: RIGHT TIBIA AND FIBULA - 2 VIEW; DG C-ARM 1-60 MIN COMPARISON:  Preoperative radiograph 01/01/2021 FINDINGS: Nine fluoroscopic spot views of the right tibia and fibula obtained in the operating room. Intramedullary nail with proximal and distal locking screws traverse distal tibial shaft fracture. There is an oblique mildly displaced proximal fibular fracture that was not included on the ankle exam. Fluoroscopy time 2 minutes 19 seconds. Dose 3.61 mGy. IMPRESSION: 1. Intraoperative fluoroscopy during right tibial intramedullary nail fixation of distal tibial fracture. No visualized complication. 2. Displaced proximal fibular fracture, not included on recent ankle exam. Electronically Signed   By: Narda Rutherford M.D.   On: 01/07/2021 09:51   DG Ankle Complete Right  Result Date: 01/01/2021 CLINICAL DATA:  Fall, trauma EXAM: RIGHT ANKLE - COMPLETE 3+ VIEW COMPARISON:  None. FINDINGS: Oblique fracture through the distal tibial metaphysis at the metadiaphyseal junction. There is 8 mm of lateral displacement of the distal fracture fragment and 8 mm of posterior displacement. Mild anterior override. Fracture  does not enter the articular surface. No dislocation. Ankle mortise intact. IMPRESSION: Oblique displaced fracture through the distal tibial metadiaphysis. Electronically Signed   By: Genevive Bi M.D.   On: 01/01/2021 11:33   DG Tibia/Fibula Right Port  Result Date: 01/07/2021 CLINICAL DATA:  Status post right tibial intramedullary rod fixation. EXAM: PORTABLE RIGHT TIBIA AND FIBULA - 2 VIEW COMPARISON:  Fluoroscopic images of same day. FINDINGS: Status post intramedullary rod fixation of distal right tibial fracture. Improved alignment of fracture components is noted. Mildly displaced proximal right fibular fracture  is again noted. No soft tissue abnormality is noted. IMPRESSION: Status post intramedullary rod fixation of distal right tibial fracture. Electronically Signed   By: Lupita Raider M.D.   On: 01/07/2021 12:15   DG C-Arm 1-60 Min  Result Date: 01/07/2021 CLINICAL DATA:  Right tibial nail. EXAM: RIGHT TIBIA AND FIBULA - 2 VIEW; DG C-ARM 1-60 MIN COMPARISON:  Preoperative radiograph 01/01/2021 FINDINGS: Nine fluoroscopic spot views of the right tibia and fibula obtained in the operating room. Intramedullary nail with proximal and distal locking screws traverse distal tibial shaft fracture. There is an oblique mildly displaced proximal fibular fracture that was not included on the ankle exam. Fluoroscopy time 2 minutes 19 seconds. Dose 3.61 mGy. IMPRESSION: 1. Intraoperative fluoroscopy during right tibial intramedullary nail fixation of distal tibial fracture. No visualized complication. 2. Displaced proximal fibular fracture, not included on recent ankle exam. Electronically Signed   By: Narda Rutherford M.D.   On: 01/07/2021 09:51    Disposition: Discharge disposition: 06-Home-Health Care Svc          Follow-up Information     Haddix, Gillie Manners, MD. Schedule an appointment as soon as possible for a visit in 2 week(s).   Specialty: Orthopedic Surgery Why: for repeat x-rays and  wound check Contact information: [address] [place] [address] [phone]                  Signed: Armida Sans PA-C 01/08/2021, 1:58 PM

## 2021-01-08 NOTE — Evaluation (Signed)
Occupational Therapy Evaluation Patient Details Name: Sierra Glover MRN: [id number] DOB: [birth year] Today's Date: 01/08/2021    History of Present Illness Pt is a 65 y.o. female who presented 01/07/21 for s/p IM nailing of R tibia fx and percutaneous fixation of R medial malleolus fx in which she sustained a R extra-articular distal tibia fracture when she slipped and fell in her garden 01/01/21. PMH: DVT, anemia, HTN.   Clinical Impression   Pt admitted for procedure listed above. Prior to fall last weekend, pt reported that she was independent with all ADL's and IADL's, including working and driving. After the fall, pt reported needing some assistance with mobility due to decreased activity tolerance and anxiety with using crutches. This session, pt used a RW for all functional mobility, requiring supervision-min guard for safety due to weakness and decreased activity tolerance, the same applies for all ADL's. Acute OT will follow up to address deficits listed below.     Follow Up Recommendations  Home health OT;Supervision - Intermittent    Equipment Recommendations  Tub/shower seat;Other (comment) (RW)    Recommendations for Other Services       Precautions / Restrictions Precautions Precautions: Fall Restrictions Weight Bearing Restrictions: Yes RLE Weight Bearing: Non weight bearing Other Position/Activity Restrictions: CAM boot      Mobility Bed Mobility Overal bed mobility: Independent             General bed mobility comments: Pt able to transition supine <> sit EOB safely with bed flat.    Transfers Overall transfer level: Needs assistance Equipment used: Rolling walker (2 wheeled) Transfers: Sit to/from Stand Sit to Stand: Min guard         General transfer comment: Min guard assist for safety, no LOB. Cues for hand placement and foot placement.    Balance Overall balance assessment: Needs assistance Sitting-balance support: No upper extremity  supported;Feet supported Sitting balance-Leahy Scale: Normal Sitting balance - Comments: Able to reach off BOS to donn/doff socks/shoes.   Standing balance support: Bilateral upper extremity supported;Single extremity supported;During functional activity Standing balance-Leahy Scale: Poor Standing balance comment: Reliant on 1-2 UE support for stability while maintaining NWB on R lower extremity                           ADL either performed or assessed with clinical judgement   ADL Overall ADL's : Needs assistance/impaired                                       General ADL Comments: Pt requiring supervision to min guard for all ADL's including transfers, unless activity is seated. Doing well with RW, reports she feels much safer than she did with crutches.     Vision Baseline Vision/History: No visual deficits Patient Visual Report: No change from baseline Vision Assessment?: No apparent visual deficits     Perception Perception Perception Tested?: No   Praxis Praxis Praxis tested?: Not tested    Pertinent Vitals/Pain Pain Assessment: 0-10 Pain Score: 4  Pain Location: R leg Pain Descriptors / Indicators: Discomfort;Guarding;Grimacing;Operative site guarding;Throbbing Pain Intervention(s): Limited activity within patient's tolerance;Monitored during session;Repositioned     Hand Dominance Right   Extremity/Trunk Assessment Upper Extremity Assessment Upper Extremity Assessment: Overall WFL for tasks assessed   Lower Extremity Assessment Lower Extremity Assessment: Defer to PT evaluation   Cervical / Trunk Assessment Cervical /  Trunk Assessment: Normal   Communication Communication Communication: No difficulties   Cognition Arousal/Alertness: Awake/alert Behavior During Therapy: WFL for tasks assessed/performed Overall Cognitive Status: Within Functional Limits for tasks assessed                                 General  Comments: A&Ox4.   General Comments  VSS on RA, O2 97% at end of ambulation    Exercises     Shoulder Instructions      Home Living Family/patient expects to be discharged to:: Private residence Living Arrangements: Spouse/significant other;Other relatives Available Help at Discharge: Family;Available 24 hours/day Type of Home: House Home Access: Stairs to enter [employer] of Steps: 2 Entrance Stairs-Rails: None Home Layout: Two level;Able to live on main level with bedroom/bathroom     Bathroom Shower/Tub: Producer, television/film/video: Handicapped height Bathroom Accessibility: Yes How Accessible: Accessible via walker Home Equipment: Crutches;Toilet riser;Grab bars - toilet;Transport chair          Prior Functioning/Environment Level of Independence: Independent        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        OT Problem List: Decreased strength;Decreased activity tolerance;Impaired balance (sitting and/or standing);Decreased coordination;Decreased knowledge of use of DME or AE;Pain      OT Treatment/Interventions: Self-care/ADL training;Therapeutic exercise;Energy conservation;DME and/or AE instruction;Therapeutic activities;Balance training;Patient/family education    OT Goals(Current goals can be found in the care plan section) Acute Rehab OT Goals Patient Stated Goal: to get better OT Goal Formulation: With patient Time For Goal Achievement: 01/22/21 Potential to Achieve Goals: Good ADL Goals Pt Will Perform Grooming: with modified independence;standing Pt Will Perform Upper Body Bathing: with modified independence;standing Pt Will Perform Lower Body Bathing: with modified independence;sitting/lateral leans;sit to/from stand Pt Will Perform Upper Body Dressing: Independently;sitting Pt Will Perform Lower Body Dressing: with modified independence;sitting/lateral leans;sit to/from stand Pt Will Transfer to  Toilet: with modified independence;ambulating Pt Will Perform Toileting - Clothing Manipulation and hygiene: with modified independence;sitting/lateral leans;sit to/from stand  OT Frequency: Min 2X/week   Barriers to D/C:            Co-evaluation              AM-PAC OT "6 Clicks" Daily Activity     Outcome Measure Help from another person eating meals?: None Help from another person taking care of personal grooming?: A Little Help from another person toileting, which includes using toliet, bedpan, or urinal?: A Little Help from another person bathing (including washing, rinsing, drying)?: A Little Help from another person to put on and taking off regular upper body clothing?: None Help from another person to put on and taking off regular lower body clothing?: A Little 6 Click Score: 20   End of Session Equipment Utilized During Treatment: Gait belt;Rolling walker Nurse Communication: Mobility status  Activity Tolerance: Patient tolerated treatment well Patient left: in bed;with call bell/phone within reach;with family/visitor present;with nursing/sitter in room  OT Visit Diagnosis: Unsteadiness on feet (R26.81);Other abnormalities of gait and mobility (R26.89);Muscle weakness (generalized) (M62.81)                Time: 7169-6789 OT Time Calculation (min): 35 min Charges:  OT General Charges $OT Visit: 1 Visit OT Evaluation $OT Eval Moderate Complexity: 1 Mod OT Treatments $Self Care/Home Management : 8-22 mins  Chrissy Ealey H., OTR/L Acute Rehabilitation  Doralyn Kirkes Elane Bing Plume 01/08/2021,  10:25 AM

## 2021-01-10 ENCOUNTER — Encounter (HOSPITAL_COMMUNITY): Payer: Self-pay | Admitting: Student

## 2021-01-10 DIAGNOSIS — Z86718 Personal history of other venous thrombosis and embolism: Secondary | ICD-10-CM

## 2021-01-10 DIAGNOSIS — Z7982 Long term (current) use of aspirin: Secondary | ICD-10-CM | POA: Diagnosis not present

## 2021-01-10 DIAGNOSIS — S82231D Displaced oblique fracture of shaft of right tibia, subsequent encounter for closed fracture with routine healing: Secondary | ICD-10-CM | POA: Diagnosis not present

## 2021-01-10 DIAGNOSIS — I10 Essential (primary) hypertension: Secondary | ICD-10-CM | POA: Diagnosis not present

## 2021-01-10 DIAGNOSIS — Z9181 History of falling: Secondary | ICD-10-CM

## 2021-01-10 DIAGNOSIS — S82301D Unspecified fracture of lower end of right tibia, subsequent encounter for closed fracture with routine healing: Secondary | ICD-10-CM | POA: Diagnosis not present

## 2021-01-17 ENCOUNTER — Other Ambulatory Visit: Payer: Self-pay

## 2021-01-17 DIAGNOSIS — R42 Dizziness and giddiness: Secondary | ICD-10-CM

## 2021-01-17 MED ORDER — MECLIZINE HCL 25 MG PO TABS: 25.0000 mg | ORAL_TABLET | Freq: Two times a day (BID) | ORAL | 0 refills | Status: DC | PRN

## 2021-01-18 ENCOUNTER — Encounter: Payer: Self-pay | Admitting: Nurse Practitioner

## 2021-01-18 ENCOUNTER — Telehealth (INDEPENDENT_AMBULATORY_CARE_PROVIDER_SITE_OTHER): Payer: Medicare Other | Admitting: Nurse Practitioner

## 2021-01-18 ENCOUNTER — Other Ambulatory Visit: Payer: Self-pay

## 2021-01-18 VITALS — Ht 65.0 in

## 2021-01-18 DIAGNOSIS — R110 Nausea: Secondary | ICD-10-CM

## 2021-01-18 MED ORDER — ONDANSETRON 8 MG PO TBDP: 8.0000 mg | ORAL_TABLET | Freq: Three times a day (TID) | ORAL | 0 refills | Status: DC | PRN

## 2021-01-18 MED ORDER — ONDANSETRON 8 MG PO TBDP: 8.0000 mg | ORAL_TABLET | Freq: Three times a day (TID) | ORAL | 0 refills | Status: AC | PRN

## 2021-01-18 NOTE — Progress Notes (Signed)
Virtual Visit via 100% video visit    This visit type was conducted due to national recommendations for restrictions regarding the COVID-19 Pandemic (e.g. social distancing) in an effort to limit this patient's exposure and mitigate transmission in our community.  Due to her co-morbid illnesses, this patient is at least at moderate risk for complications without adequate follow up.  This format is felt to be most appropriate for this patient at this time.  All issues noted in this document were discussed and addressed.  A limited physical exam was performed with this format.    This visit type was conducted due to national recommendations for restrictions regarding the COVID-19 Pandemic (e.g. social distancing) in an effort to limit this patient's exposure and mitigate transmission in our community.  Patients identity confirmed using two different identifiers.  This format is felt to be most appropriate for this patient at this time.  All issues noted in this document were discussed and addressed.  No physical exam was performed (except for noted visual exam findings with Video Visits).    Date:  01/18/2021   ID:  Sierra Glover, DOB March 17, 1956, MRN [id number]  Patient Location: Home   Provider location:   Office   Chief Complaint:  Nausea. She had surgery on her right tibia on 7/22 and since has had nausea. She does have a follow up appt with her surgeon coming up.   History of Present Illness:    Sierra Glover is a 65 y.o. female who presents via video conferencing for a telehealth visit today.    The patient does not have symptoms concerning for COVID-19 infection (fever, chills, cough, or new shortness of breath).   The patient is being evaluated for nausea. She had surgery on the 7/22. She was having some nausea. She is taking oxycodone. She tested for COVID and it was negative. She follows   Other Associated symptoms include nausea. Pertinent negatives include no chills,  congestion, coughing or fever.    Past Medical History:  Diagnosis Date   Anemia    before my hysterectomy   DVT (deep venous thrombosis) (HCC)    "Many, many years ago."   HTN (hypertension)    Past Surgical History:  Procedure Laterality Date   COLONOSCOPY     partal hysterectomy  2009   TIBIA IM NAIL INSERTION Right 01/07/2021   Procedure: INTRAMEDULLARY (IM) NAIL TIBIAL;  Surgeon: Roby Lofts, MD;  Location: [facility] OR;  Service: Orthopedics;  Laterality: Right;     No outpatient medications have been marked as taking for the 01/18/21 encounter (Video Visit) with Charlesetta Ivory, NP.     Allergies:   Patient has no known allergies.   Social History   Tobacco Use   Smoking status: Never   Smokeless tobacco: Never  Vaping Use   Vaping Use: Never used  Substance Use Topics   Alcohol use: Yes    Comment: social   Drug use: Never     Family Hx: The patient's family history includes Alzheimer's disease in her mother; COPD in her father; Hypertension in her father, mother, and another family member.  ROS:   Please see the history of present illness.    Review of Systems  Constitutional:  Negative for chills, fever and malaise/fatigue.  HENT:  Negative for congestion and sinus pain.   Respiratory:  Negative for cough, shortness of breath and wheezing.   Cardiovascular:  Negative for claudication and leg swelling.  Gastrointestinal:  Positive for  nausea.  Neurological:  Negative for dizziness.   All other systems reviewed and are negative.   Labs/Other Tests and Data Reviewed:    Recent Labs: 05/31/2020: TSH 1.050 11/29/2020: ALT 14 01/08/2021: BUN 14; Creatinine, Ser 0.89; Hemoglobin 11.0; Platelets 275; Potassium 3.5; Sodium 136   Recent Lipid Panel Lab Results  Component Value Date/Time   CHOL 234 (H) 11/29/2020 09:31 AM   TRIG 107 11/29/2020 09:31 AM   HDL 60 11/29/2020 09:31 AM   CHOLHDL 3.9 11/29/2020 09:31 AM   LDLCALC 155 (H) 11/29/2020 09:31 AM     Wt Readings from Last 3 Encounters:  01/07/21 145 lb 1 oz (65.8 kg)  01/03/21 145 lb (65.8 kg)  01/01/21 145 lb (65.8 kg)     Exam:    Vital Signs:  Ht 5\' 5"  (1.651 m)   BMI 24.14 kg/m     Physical Exam Vitals and nursing note reviewed.  HENT:     Head: Normocephalic and atraumatic.  Pulmonary:     Effort: Pulmonary effort is normal.  Neurological:     Mental Status: She is alert and oriented to person, place, and time.  Psychiatric:        Mood and Affect: Affect normal.    ASSESSMENT & PLAN:    1. Nausea - ondansetron (ZOFRAN ODT) 8 MG disintegrating tablet; Take 1 tablet (8 mg total) by mouth every 8 (eight) hours as needed for nausea or vomiting.  Dispense: 20 tablet; Refill: 0 -Patient is having some post OP nausea. She is advised to follow up with her surgeon. Denies chest pain, leg pain or shortness of breath. Denies Covid symptoms such as fever, fatigue. Tested Neg at home. Will send antiemetic medication. Advised patient to stay hydrated. Patient will call if she has any other concerns or questions.   Follow up: if symptoms persist or do not get better.   The patient was encouraged to call or send a message through MyChart for any questions or concerns.   Side effects and appropriate use of all the medication(s) were discussed with the patient today. Patient advised to use the medication(s) as directed by their healthcare provider. The patient was encouraged to read, review, and understand all associated package inserts and contact our office with any questions or concerns. The patient accepts the risks of the treatment plan and had an opportunity to ask questions.   Patient was given opportunity to ask questions. Patient verbalized understanding of the plan and was able to repeat key elements of the plan. All questions were answered to their satisfaction.  Raman Ghumman, DNP   I, Raman Ghumman have reviewed all documentation for this visit. The documentation on  01/18/21 for the exam, diagnosis, procedures, and orders are all accurate and complete.   COVID-19 Education: The signs and symptoms of COVID-19 were discussed with the patient and how to seek care for testing (follow up with PCP or arrange E-visit).  The importance of social distancing was discussed today.  Patient Risk:   After full review of this patients clinical status, I feel that they are at least moderate risk at this time.  Time:   Today, I have spent 10 minutes/ seconds with the patient with telehealth technology discussing above diagnoses.     Medication Adjustments/Labs and Tests Ordered: Current medicines are reviewed at length with the patient today.  Concerns regarding medicines are outlined above.   Tests Ordered: No orders of the defined types were placed in this encounter.   Medication  Changes: No orders of the defined types were placed in this encounter.   Disposition:  Follow up if nausea gets worse.   Signed, Charlesetta Ivory, NP

## 2021-01-18 NOTE — Patient Instructions (Signed)
Nausea, Adult Nausea is the feeling that you have an upset stomach or that you are about to vomit. Nausea on its own is not usually a serious concern, but it may be an early sign of a more serious medical problem. As nausea gets worse, it can lead to vomiting. If vomiting develops, or if you are not able to drink enough fluids, you are at risk of becoming dehydrated. Dehydration can make you tired and thirsty, cause you to have a dry mouth, and decrease how often you urinate. Older adults and people with other diseases or a weak disease-fighting system (immune system) are at higher risk for dehydration. The main goals of treating your nausea are: To relieve your nausea. To limit repeated nausea episodes. To prevent vomiting and dehydration. Follow these instructions at home: Watch your symptoms for any changes. Tell your health care provider about them.Follow these instructions as told by your health care provider. Eating and drinking     Take an oral rehydration solution (ORS). This is a drink that is sold at pharmacies and retail stores. Drink clear fluids slowly and in small amounts as you are able. Clear fluids include water, ice chips, low-calorie sports drinks, and fruit juice that has water added (diluted fruit juice). Eat bland, easy-to-digest foods in small amounts as you are able. These foods include bananas, applesauce, rice, lean meats, toast, and crackers. Avoid drinking fluids that contain a lot of sugar or caffeine, such as energy drinks, sports drinks, and soda. Avoid alcohol. Avoid spicy or fatty foods. General instructions Take over-the-counter and prescription medicines only as told by your health care provider. Rest at home while you recover. Drink enough fluid to keep your urine pale yellow. Breathe slowly and deeply when you feel nauseous. Avoid smelling things that have strong odors. Wash your hands often using soap and water. If soap and water are not available, use  hand sanitizer. Make sure that all people in your household wash their hands well and often. Keep all follow-up visits as told by your health care provider. This is important. Contact a health care provider if: Your nausea gets worse. Your nausea does not go away after two days. You vomit. You cannot drink fluids without vomiting. You have any of the following: New symptoms. A fever. A headache. Muscle cramps. A rash. Pain while urinating. You feel light-headed or dizzy. Get help right away if: You have pain in your chest, neck, arm, or jaw. You feel extremely weak or you faint. You have vomit that is bright red or looks like coffee grounds. You have bloody or black stools or stools that look like tar. You have a severe headache, a stiff neck, or both. You have severe pain, cramping, or bloating in your abdomen. You have difficulty breathing or are breathing very quickly. Your heart is beating very quickly. Your skin feels cold and clammy. You feel confused. You have signs of dehydration, such as: Dark urine, very little urine, or no urine. Cracked lips. Dry mouth. Sunken eyes. Sleepiness. Weakness. These symptoms may represent a serious problem that is an emergency. Do not wait to see if the symptoms will go away. Get medical help right away. Call your local emergency services (911 in the [place] Do not drive yourself to the hospital. Summary Nausea is the feeling that you have an upset stomach or that you are about to vomit. Nausea on its own is not usually a serious concern, but it may be an early sign of a   more serious medical problem. If vomiting develops, or if you are not able to drink enough fluids, you are at risk of becoming dehydrated. Follow recommendations for eating and drinking and take over-the-counter and prescription medicines only as told by your health care provider. Contact a health care provider right away if your symptoms worsen or you have new  symptoms. Keep all follow-up visits as told by your health care provider. This is important. This information is not intended to replace advice given to you by your health care provider. Make sure you discuss any questions you have with your healthcare provider. Document Revised: 05/06/2019 Document Reviewed: 11/13/2017 Elsevier Patient Education  2022 Elsevier Inc.  

## 2021-02-03 ENCOUNTER — Ambulatory Visit: Payer: Self-pay | Admitting: Internal Medicine

## 2021-02-23 ENCOUNTER — Other Ambulatory Visit: Payer: Self-pay

## 2021-02-23 ENCOUNTER — Ambulatory Visit (INDEPENDENT_AMBULATORY_CARE_PROVIDER_SITE_OTHER): Payer: Medicare Other | Admitting: Otolaryngology

## 2021-02-23 DIAGNOSIS — R42 Dizziness and giddiness: Secondary | ICD-10-CM

## 2021-02-23 NOTE — Progress Notes (Signed)
HPI: Sierra Glover is a 65 y.o. female who presents is referred by her PCP for evaluation of dizziness that she has had for couple months.  She describes more of a lightheadedness than actual vertigo.  This may occur once every week or 2.  She does not have any episodes while in bed at night. She denies any headaches or ear pain. She has not noted any change in her hearing and feels like she hears well..  Past Medical History:  Diagnosis Date   Anemia    before my hysterectomy   DVT (deep venous thrombosis) (HCC)    "Many, many years ago."   HTN (hypertension)    Past Surgical History:  Procedure Laterality Date   COLONOSCOPY     partal hysterectomy  2009   TIBIA IM NAIL INSERTION Right 01/07/2021   Procedure: INTRAMEDULLARY (IM) NAIL TIBIAL;  Surgeon: Roby Lofts, MD;  Location: [facility] OR;  Service: Orthopedics;  Laterality: Right;   Social History   Socioeconomic History   Marital status: Married    Spouse name: Not on file   Number of children: Not on file   Years of education: Not on file   Highest education level: Not on file  Occupational History   Not on file  Tobacco Use   Smoking status: Never   Smokeless tobacco: Never  Vaping Use   Vaping Use: Never used  Substance and Sexual Activity   Alcohol use: Yes    Comment: social   Drug use: Never   Sexual activity: Yes  Other Topics Concern   Not on file  Social History Narrative   Not on file   Social Determinants of Health   Financial Resource Strain: Not on file  Food Insecurity: Not on file  Transportation Needs: Not on file  Physical Activity: Not on file  Stress: Not on file  Social Connections: Not on file   Family History  Problem Relation Age of Onset   Alzheimer's disease Mother    Hypertension Mother    Hypertension Father    COPD Father    Hypertension Other    No Known Allergies Prior to Admission medications   Medication Sig Start Date End Date Taking? Authorizing Provider   atorvastatin (LIPITOR) 20 MG tablet Take 1 tablet (20 mg total) by mouth daily. 12/01/20 12/01/21  Dorothyann Peng, MD  ciclopirox [facility]) 8 % solution Apply topically at bedtime. Apply over nail and surrounding skin. Apply daily over previous coat. After seven (7) days, may remove with alcohol and continue cycle. Patient taking differently: Apply 1 application topically See admin instructions. Apply over nail and surrounding skin. Apply daily over previous coat. After seven (7) days, may remove with alcohol and continue cycle. If need 11/29/20   Dorothyann Peng, MD  docusate sodium (COLACE) 100 MG capsule Take 1 capsule (100 mg total) by mouth 2 (two) times daily. 01/07/21   Despina Hidden, PA-C  fexofenadine (ALLEGRA ALLERGY) 180 MG tablet Take 1 tablet (180 mg total) by mouth daily. Patient taking differently: Take 180 mg by mouth daily as needed for allergies. 05/31/20   Dorothyann Peng, MD  fluticasone (FLONASE) 50 MCG/ACT nasal spray SPRAY 1 SPRAY INTO EACH NOSTRIL EVERY DAY Patient taking differently: Place 2 sprays into both nostrils daily as needed for allergies or rhinitis. 07/28/19   Dorothyann Peng, MD  meclizine (ANTIVERT) 25 MG tablet Take 1 tablet (25 mg total) by mouth 2 (two) times daily as needed for dizziness. 01/17/21  Dorothyann Peng, MD  ondansetron (ZOFRAN ODT) 8 MG disintegrating tablet Take 1 tablet (8 mg total) by mouth every 8 (eight) hours as needed for nausea or vomiting. 01/18/21   Charlesetta Ivory, NP  oxyCODONE-acetaminophen (PERCOCET/ROXICET) 5-325 MG tablet Take 1-2 tablets by mouth every 4 (four) hours as needed for moderate pain or severe pain. 01/07/21   Despina Hidden, PA-C  triamterene-hydrochlorothiazide (MAXZIDE-25) 37.5-25 MG tablet Take 1 tablet by mouth daily. 05/31/20   Dorothyann Peng, MD  valACYclovir (VALTREX) 500 MG tablet take 1 tablet by oral route  every day 09/20/18   Dorothyann Peng, MD     Positive ROS: Otherwise negative  All other systems have been  reviewed and were otherwise negative with the exception of those mentioned in the HPI and as above.  Physical Exam: Constitutional: Alert, well-appearing, no acute distress Ears: External ears without lesions or tenderness. Ear canals are clear bilaterally with intact, clear TMs bilaterally.  On Dix-Hallpike testing she had no clinical evidence of BPPV.  On hearing screening with the 512 1024 tuning fork she had good hearing in both ears subjectively. Nasal: External nose without lesions. Septum with minimal deformity. Clear nasal passages Oral: Lips and gums without lesions. Tongue and palate mucosa without lesions. Posterior oropharynx clear. Neck: No palpable adenopathy or masses Respiratory: Breathing comfortably  Skin: No facial/neck lesions or rash noted.  Procedures  Assessment: Dizziness questionable etiology On clinical exam this is not consistent with inner ear or vestibular pathology  Plan: Reviewed with her concerning normal ear examination. If she continues to have problems with dizziness would recommend further evaluation with neurology.   Narda Bonds, MD   CC:

## 2021-02-28 ENCOUNTER — Telehealth: Payer: Self-pay

## 2021-02-28 NOTE — Telephone Encounter (Signed)
Tressia Danas PT with [facility] called regarding pt. He is requesting a one week one extension on her home health PT so he can follow her one visit passed her orthopaedic follow up. [phone] he stated this is a secured line.   I returned his call and gave the verbal order okay per Dr.Sanders. YL,RMA

## 2021-05-06 ENCOUNTER — Encounter: Payer: Self-pay | Admitting: Internal Medicine

## 2021-05-19 ENCOUNTER — Ambulatory Visit (INDEPENDENT_AMBULATORY_CARE_PROVIDER_SITE_OTHER): Payer: Medicare Other | Admitting: Internal Medicine

## 2021-05-19 ENCOUNTER — Other Ambulatory Visit: Payer: Self-pay

## 2021-05-19 ENCOUNTER — Encounter: Payer: Self-pay | Admitting: Internal Medicine

## 2021-05-19 VITALS — Temp 98.4°F | Ht 65.0 in | Wt 144.0 lb

## 2021-05-19 DIAGNOSIS — R42 Dizziness and giddiness: Secondary | ICD-10-CM | POA: Diagnosis not present

## 2021-05-19 DIAGNOSIS — Z8616 Personal history of COVID-19: Secondary | ICD-10-CM | POA: Diagnosis not present

## 2021-05-19 DIAGNOSIS — I10 Essential (primary) hypertension: Secondary | ICD-10-CM | POA: Diagnosis not present

## 2021-05-19 MED ORDER — LORATADINE 10 MG PO TABS: 10.0000 mg | ORAL_TABLET | Freq: Every day | ORAL | 2 refills | Status: AC

## 2021-05-19 MED ORDER — AMLODIPINE BESYLATE 2.5 MG PO TABS: 2.5000 mg | ORAL_TABLET | Freq: Every day | ORAL | 11 refills | Status: DC

## 2021-05-19 NOTE — Progress Notes (Signed)
I,Tianna Badgett,acting as a Education administrator for [name], MD.,have documented all relevant documentation on the behalf of [name], MD,as directed by  [name], MD while in the presence of [name], MD.  This visit occurred during the SARS-CoV-2 public health emergency.  Safety protocols were in place, including screening questions prior to the visit, additional usage of staff PPE, and extensive cleaning of exam room while observing appropriate contact time as indicated for disinfecting solutions.  Subjective:     Patient ID: Sierra Glover , female    DOB: 1956-04-24 , 65 y.o.   MRN: [id number]   Chief Complaint  Patient presents with   Dizziness    HPI  She presents today c/o lightheadedness and fatigue. States her sx have wosened since having COVID in October. She denies change in her appetite and sleeping habits. Positional changes usually trigger her sx. Denies headaches and visual changes.    Dizziness This is a recurrent problem. The current episode started more than 1 month ago. The problem has been unchanged. Pertinent negatives include no arthralgias, headaches or swollen glands. The symptoms are aggravated by standing. The treatment provided moderate relief.    Past Medical History:  Diagnosis Date   Anemia    before my hysterectomy   DVT (deep venous thrombosis) (HCC)    "Many, many years ago."   HTN (hypertension)      Family History  Problem Relation Age of Onset   Alzheimer's disease Mother    Hypertension Mother    Hypertension Father    COPD Father    Hypertension Other      Current Outpatient Medications:    amLODipine (NORVASC) 2.5 MG tablet, Take 1 tablet (2.5 mg total) by mouth daily., Disp: 30 tablet, Rfl: 11   atorvastatin (LIPITOR) 20 MG tablet, Take 1 tablet (20 mg total) by mouth daily., Disp: 30 tablet, Rfl: 11   ciclopirox (PENLAC) 8 % solution, Apply topically at bedtime. Apply over nail and surrounding skin. Apply daily  over previous coat. After seven (7) days, may remove with alcohol and continue cycle. (Patient taking differently: Apply 1 application topically See admin instructions. Apply over nail and surrounding skin. Apply daily over previous coat. After seven (7) days, may remove with alcohol and continue cycle. If need), Disp: 6.6 mL, Rfl: 0   fluticasone (FLONASE) 50 MCG/ACT nasal spray, SPRAY 1 SPRAY INTO EACH NOSTRIL EVERY DAY (Patient taking differently: Place 2 sprays into both nostrils daily as needed for allergies or rhinitis.), Disp: 48 mL, Rfl: 1   loratadine (CLARITIN) 10 MG tablet, Take 1 tablet (10 mg total) by mouth daily., Disp: 30 tablet, Rfl: 2   meclizine (ANTIVERT) 25 MG tablet, Take 1 tablet (25 mg total) by mouth 2 (two) times daily as needed for dizziness., Disp: 30 tablet, Rfl: 0   ondansetron (ZOFRAN ODT) 8 MG disintegrating tablet, Take 1 tablet (8 mg total) by mouth every 8 (eight) hours as needed for nausea or vomiting., Disp: 20 tablet, Rfl: 0   triamterene-hydrochlorothiazide (MAXZIDE-25) 37.5-25 MG tablet, Take 1 tablet by mouth daily., Disp: 90 tablet, Rfl: 2   valACYclovir (VALTREX) 500 MG tablet, take 1 tablet by oral route  every day, Disp: 90 tablet, Rfl: 0   No Known Allergies   Review of Systems  Constitutional: Negative.   Respiratory: Negative.    Cardiovascular: Negative.   Gastrointestinal: Negative.   Musculoskeletal:  Negative for arthralgias.  Neurological:  Positive for dizziness. Negative for headaches.  Psychiatric/Behavioral: Negative.      Today's Vitals   05/19/21 1053  Temp: 98.4 F (36.9 C)  TempSrc: Oral  Weight: 144 lb (65.3 kg)  Height: $Remove'5\' 5"'FFOqLGZ$  (1.651 m)   Body mass index is 23.96 kg/m.   Objective:  Physical Exam Vitals and nursing note reviewed.  Constitutional:      Appearance: Normal appearance.  HENT:     Head: Normocephalic and atraumatic.     Right Ear: Tympanic membrane, ear canal and external ear normal. There is no impacted  cerumen.     Left Ear: Tympanic membrane, ear canal and external ear normal. There is no impacted cerumen.     Nose:     Comments: Masked     Mouth/Throat:     Comments: Masked  Eyes:     Extraocular Movements: Extraocular movements intact.  Cardiovascular:     Rate and Rhythm: Normal rate and regular rhythm.     Heart sounds: Normal heart sounds.  Pulmonary:     Effort: Pulmonary effort is normal.     Breath sounds: Normal breath sounds.  Musculoskeletal:     Cervical back: Normal range of motion.  Skin:    General: Skin is warm.  Neurological:     General: No focal deficit present.     Mental Status: She is alert.  Psychiatric:        Mood and Affect: Mood normal.        Behavior: Behavior normal.        Assessment And Plan:     1. Dizziness Comments: She is not orthostatic. Sx possibly related to elevated blood pressure. Encouraged to stay well hydrated.  - EKG 12-Lead - CMP14+EGFR - CBC no Diff - TSH  2. Essential hypertension, benign Comments: Uncontrolled. I will add amlodipine 2.$RemoveBeforeDE'5mg'zPhHaajVQMfHGSo$  nightly. She agrees to rto in 2 -3 weeks for a nurse visit for BP check. I will make further recommendations then.  - CMP14+EGFR  3. Personal history of COVID-19 Comments: She had infection in October. She is vaccinated x 2, not yet boosted.     Patient was given opportunity to ask questions. Patient verbalized understanding of the plan and was able to repeat key elements of the plan. All questions were answered to their satisfaction.   I, [name], MD, have reviewed all documentation for this visit. The documentation on 05/19/21 for the exam, diagnosis, procedures, and orders are all accurate and complete.   IF YOU HAVE BEEN REFERRED TO A SPECIALIST, IT MAY TAKE 1-2 WEEKS TO SCHEDULE/PROCESS THE REFERRAL. IF YOU HAVE NOT HEARD FROM US/SPECIALIST IN TWO WEEKS, PLEASE GIVE [place] A CALL AT [phone] X [phone].   THE PATIENT IS ENCOURAGED TO PRACTICE SOCIAL DISTANCING DUE TO THE  COVID-19 PANDEMIC.

## 2021-05-19 NOTE — Patient Instructions (Addendum)
Vitamin B-complex, use as directed  Dizziness Dizziness is a common problem. It makes you feel unsteady or light-headed. You may feel like you are about to pass out (faint). Dizziness can lead to getting hurt if you stumble or fall. Dizziness can be caused by many things, including: Medicines. Not having enough water in your body (dehydration). Illness. Follow these instructions at home: Eating and drinking  Drink enough fluid to keep your pee (urine) pale yellow. This helps to keep you from getting dehydrated. Try to drink more clear fluids, such as water. Do not drink alcohol. Limit how much caffeine you drink or eat, if your doctor tells you to do that. Limit how much salt (sodium) you drink or eat, if your doctor tells you to do that. Activity pibName

## 2021-05-20 LAB — CBC
Hematocrit: 39.7 % (ref 34.0–46.6)
Hemoglobin: 13.4 g/dL (ref 11.1–15.9)
MCH: 28.2 pg (ref 26.6–33.0)
MCHC: 33.8 g/dL (ref 31.5–35.7)
MCV: 83 fL (ref 79–97)
Platelets: 332 10*3/uL (ref 150–450)
RBC: 4.76 x10E6/uL (ref 3.77–5.28)
RDW: 12.8 % (ref 11.7–15.4)
WBC: 5.9 10*3/uL (ref 3.4–10.8)

## 2021-05-20 LAB — CMP14+EGFR
ALT: 18 IU/L (ref 0–32)
AST: 20 IU/L (ref 0–40)
Albumin/Globulin Ratio: 1.5 (ref 1.2–2.2)
Albumin: 4.7 g/dL (ref 3.8–4.8)
Alkaline Phosphatase: 130 IU/L — ABNORMAL HIGH (ref 44–121)
BUN/Creatinine Ratio: 15 (ref 12–28)
BUN: 11 mg/dL (ref 8–27)
Bilirubin Total: 0.8 mg/dL (ref 0.0–1.2)
CO2: 29 mmol/L (ref 20–29)
Calcium: 10.9 mg/dL — ABNORMAL HIGH (ref 8.7–10.3)
Chloride: 98 mmol/L (ref 96–106)
Creatinine, Ser: 0.73 mg/dL (ref 0.57–1.00)
Globulin, Total: 3.1 g/dL (ref 1.5–4.5)
Glucose: 91 mg/dL (ref 70–99)
Potassium: 4.4 mmol/L (ref 3.5–5.2)
Sodium: 140 mmol/L (ref 134–144)
Total Protein: 7.8 g/dL (ref 6.0–8.5)
eGFR: 91 mL/min/{1.73_m2} (ref >59)

## 2021-05-20 LAB — TSH: TSH: 1.47 u[IU]/mL (ref 0.450–4.500)

## 2021-05-22 ENCOUNTER — Emergency Department (HOSPITAL_COMMUNITY)
Admission: EM | Admit: 2021-05-22 | Discharge: 2021-05-22 | Disposition: A | Discharge status: Home / Self Care | Payer: Medicare Other | Attending: Emergency Medicine | Admitting: Emergency Medicine

## 2021-05-22 ENCOUNTER — Emergency Department (HOSPITAL_COMMUNITY): Payer: Medicare Other

## 2021-05-22 ENCOUNTER — Other Ambulatory Visit: Payer: Self-pay

## 2021-05-22 DIAGNOSIS — Z79899 Other long term (current) drug therapy: Secondary | ICD-10-CM | POA: Insufficient documentation

## 2021-05-22 DIAGNOSIS — R42 Dizziness and giddiness: Secondary | ICD-10-CM | POA: Diagnosis not present

## 2021-05-22 DIAGNOSIS — I10 Essential (primary) hypertension: Secondary | ICD-10-CM | POA: Diagnosis present

## 2021-05-22 LAB — BASIC METABOLIC PANEL
Anion gap: 10 (ref 5–15)
BUN: 14 mg/dL (ref 8–23)
CO2: 27 mmol/L (ref 22–32)
Calcium: 9.7 mg/dL (ref 8.9–10.3)
Chloride: 100 mmol/L (ref 98–111)
Creatinine, Ser: 0.72 mg/dL (ref 0.44–1.00)
GFR, Estimated: >60 mL/min (ref >60)
Glucose, Bld: 103 mg/dL — ABNORMAL HIGH (ref 70–99)
Potassium: 3.4 mmol/L — ABNORMAL LOW (ref 3.5–5.1)
Sodium: 137 mmol/L (ref 135–145)

## 2021-05-22 LAB — CBC
HCT: 39.6 % (ref 36.0–46.0)
Hemoglobin: 13 g/dL (ref 12.0–15.0)
MCH: 28.8 pg (ref 26.0–34.0)
MCHC: 32.8 g/dL (ref 30.0–36.0)
MCV: 87.8 fL (ref 80.0–100.0)
Platelets: 318 10*3/uL (ref 150–400)
RBC: 4.51 MIL/uL (ref 3.87–5.11)
RDW: 12.7 % (ref 11.5–15.5)
WBC: 6.9 10*3/uL (ref 4.0–10.5)
nRBC: 0 % (ref 0.0–0.2)

## 2021-05-22 LAB — TROPONIN I (HIGH SENSITIVITY)
Troponin I (High Sensitivity): 3 ng/L (ref <18)
Troponin I (High Sensitivity): 3 ng/L (ref <18)

## 2021-05-22 NOTE — ED Triage Notes (Signed)
Pt presents for increased BP for a week, highest 183/103 at home.  Was seen by PCP for same and dizziness Thursday, started a new BP medication. Endorses intermittent fluttering in chest, denies fluttering sensation and dizziness currently.

## 2021-05-22 NOTE — ED Provider Notes (Signed)
[facility] EMERGENCY DEPARTMENT Provider Note   CSN: [id number] Arrival date & time: 05/22/21  1241     History Chief Complaint  Patient presents with   Hypertension    Sierra Glover is a 65 y.o. female.  Patient complains of some dizziness and her blood pressures been elevated.  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     Family History  Problem Relation Age of Onset   Alzheimer's disease Mother    Hypertension Mother    Hypertension Father    COPD Father    Hypertension Other     Social History   Tobacco Use   Smoking status: Never   Smokeless tobacco: Never  Vaping Use   Vaping Use: Never used  Substance Use Topics   Alcohol use: Yes    Comment: social   Drug use: Never    Home Medications Prior to Admission medications   Medication Sig Start Date End Date Taking? Authorizing Provider  amLODipine (NORVASC) 2.5 MG tablet Take 1 tablet (2.5 mg total) by mouth daily.  05/19/21 05/19/22  Dorothyann Peng, MD  atorvastatin (LIPITOR) 20 MG tablet Take 1 tablet (20 mg total) by mouth daily. 12/01/20 12/01/21  Dorothyann Peng, MD  ciclopirox [facility]) 8 % solution Apply topically at bedtime. Apply over nail and surrounding skin. Apply daily over previous coat. After seven (7) days, may remove with alcohol and continue cycle. Patient taking differently: Apply 1 application topically See admin instructions. Apply over nail and surrounding skin. Apply daily over previous coat. After seven (7) days, may remove with alcohol and continue cycle. If need 11/29/20   Dorothyann Peng, MD  fluticasone [facility]) 50 MCG/ACT nasal spray SPRAY 1 SPRAY INTO EACH NOSTRIL EVERY DAY Patient taking differently: Place 2 sprays into both nostrils daily as needed for allergies or rhinitis. 07/28/19   Dorothyann Peng, MD  loratadine (CLARITIN) 10 MG tablet Take 1 tablet (10 mg total) by mouth daily. 05/19/21 05/19/22  Dorothyann Peng, MD  meclizine (ANTIVERT) 25 MG tablet Take 1 tablet (25 mg total) by mouth 2 (two) times daily as needed for dizziness. 01/17/21   Dorothyann Peng, MD  ondansetron (ZOFRAN ODT) 8 MG disintegrating tablet Take 1 tablet (8 mg total) by mouth every 8 (eight) hours as needed for nausea or vomiting. 01/18/21   Charlesetta Ivory, NP  triamterene-hydrochlorothiazide (MAXZIDE-25) 37.5-25 MG tablet Take 1 tablet by mouth daily.  05/31/20   Dorothyann Peng, MD  valACYclovir (VALTREX) 500 MG tablet take 1 tablet by oral route  every day 09/20/18   Dorothyann Peng, MD    Allergies    Patient has no known allergies.  Review of Systems   Review of Systems  Physical Exam Updated Vital Signs BP (!) 151/81 (BP Location: Left Arm)   Pulse 81   Temp 98.5 F (36.9 C) (Oral)   Resp 16   Wt 65 kg   SpO2 99%   BMI 23.85 kg/m   Physical Exam  ED Results / Procedures / Treatments   Labs (all labs ordered are listed, but only abnormal results are displayed) Labs Reviewed  BASIC METABOLIC  PANEL - Abnormal; Notable for the following components:      Result Value   Potassium 3.4 (*)    Glucose, Bld 103 (*)    All other components within normal limits  CBC  TROPONIN I (HIGH SENSITIVITY)  TROPONIN I (HIGH SENSITIVITY)    EKG None  Radiology DG Chest 2 View  Result Date: 05/22/2021 CLINICAL DATA:  Chest pain hypertension EXAM: CHEST - 2 VIEW COMPARISON:  01/24/2016 FINDINGS: The lungs appear clear. Cardiac and mediastinal contours normal. No pleural effusion identified. Lower cervical spondylosis and mild midthoracic spondylosis. IMPRESSION: 1.  No active cardiopulmonary disease is radiographically apparent. 2. Cervical and thoracic spondylosis. Electronically Signed   By: Gaylyn Rong M.D.   On: 05/22/2021 13:43    Procedures Procedures   Medications Ordered in ED Medications - No data to display  ED Course  I have reviewed the triage vital signs and the nursing notes.  Pertinent labs & imaging results that were available during my care of the patient were reviewed by me and considered in my medical decision making (see chart for details). CRITICAL CARE Performed by: Bethann Berkshire Total critical care time: 40 minutes Critical care time was exclusive of separately billable procedures and treating other patients. Critical care was necessary to treat or prevent imminent or life-threatening deterioration. Critical care was time spent personally by me on the following activities: development of treatment plan with patient and/or surrogate as well as nursing, discussions with consultants, evaluation of patient's response to treatment, examination of patient, obtaining history from patient or surrogate, ordering and performing treatments and interventions, ordering and review of laboratory studies, ordering and review of radiographic studies, pulse oximetry and re-evaluation of patient's condition.    MDM Rules/Calculators/A&P                           Patient with  poorly controlled blood pressure we will increase her Norvasc to 5 mg a day.  Also patient with some chest discomfort but normal troponins with nonspecific EKG changes.  She is referred to cardiology for follow-up Final Clinical Impression(s) / ED Diagnoses Final diagnoses:  Primary hypertension    Rx / DC Orders ED Discharge Orders     None        Bethann Berkshire, MD 05/22/21 1704

## 2021-05-22 NOTE — Discharge Instructions (Signed)
Increase your Norvasc so you are taking 5 mg a day or 2 pills that are each 2 and half milligrams.  Follow-up with cardiology in the next few weeks

## 2021-05-31 ENCOUNTER — Other Ambulatory Visit: Payer: Self-pay

## 2021-05-31 ENCOUNTER — Telehealth: Payer: Self-pay

## 2021-05-31 ENCOUNTER — Ambulatory Visit: Payer: Medicare Other

## 2021-05-31 VITALS — BP 142/78 | HR 79 | Temp 99.0°F | Ht 65.0 in | Wt 144.4 lb

## 2021-05-31 DIAGNOSIS — I10 Essential (primary) hypertension: Secondary | ICD-10-CM

## 2021-05-31 MED ORDER — AMLODIPINE BESYLATE 5 MG PO TABS: 5.0000 mg | ORAL_TABLET | Freq: Every day | ORAL | 1 refills | Status: DC

## 2021-05-31 MED ORDER — VALSARTAN-HYDROCHLOROTHIAZIDE 160-12.5 MG PO TABS: 1.0000 | ORAL_TABLET | Freq: Every day | ORAL | 1 refills | Status: DC

## 2021-05-31 NOTE — Patient Instructions (Addendum)
°  Patient presents today for a bp check. It was 142/78 with 79 pulse. She was advised to stop the maxzide 37.5-25mg  and start taking Valsartan HCTZ 160-12.5mg  in the mornings and continue with the Amlodipine 5mg  at bedtime.she is to keep her next appt on 12/20 as a nurse visit and schedule an appt for the second week of January with Dr.Sanders.

## 2021-05-31 NOTE — Progress Notes (Signed)
Patient presents today for a bp check. It was 142/78 with 79 pulse. She was advised to stop the maxzide 37.5-25mg  and start taking Valsartan HCTZ 160-12.5mg  in the mornings and continue with the Amlodipine 5mg  at bedtime.she is to keep her next appt on 12/20 as a nurse visit and schedule an appt for the second week of January with Dr.Sanders. YL,RMA

## 2021-06-07 ENCOUNTER — Ambulatory Visit: Payer: Medicare Other

## 2021-06-07 ENCOUNTER — Other Ambulatory Visit: Payer: Self-pay

## 2021-06-07 ENCOUNTER — Ambulatory Visit (INDEPENDENT_AMBULATORY_CARE_PROVIDER_SITE_OTHER): Payer: Medicare Other | Admitting: Internal Medicine

## 2021-06-07 ENCOUNTER — Encounter: Payer: Self-pay | Admitting: Internal Medicine

## 2021-06-07 VITALS — BP 112/80 | HR 94 | Temp 98.2°F | Ht 65.0 in | Wt 143.0 lb

## 2021-06-07 DIAGNOSIS — Z79899 Other long term (current) drug therapy: Secondary | ICD-10-CM | POA: Diagnosis not present

## 2021-06-07 DIAGNOSIS — Z0000 Encounter for general adult medical examination without abnormal findings: Secondary | ICD-10-CM

## 2021-06-07 DIAGNOSIS — U099 Post covid-19 condition, unspecified: Secondary | ICD-10-CM

## 2021-06-07 DIAGNOSIS — Z2821 Immunization not carried out because of patient refusal: Secondary | ICD-10-CM

## 2021-06-07 DIAGNOSIS — I10 Essential (primary) hypertension: Secondary | ICD-10-CM | POA: Diagnosis not present

## 2021-06-07 DIAGNOSIS — R002 Palpitations: Secondary | ICD-10-CM

## 2021-06-07 DIAGNOSIS — E7800 Pure hypercholesterolemia, unspecified: Secondary | ICD-10-CM | POA: Diagnosis not present

## 2021-06-07 LAB — POCT URINALYSIS DIPSTICK
Bilirubin, UA: NEGATIVE
Blood, UA: NEGATIVE
Glucose, UA: NEGATIVE
Leukocytes, UA: NEGATIVE
Nitrite, UA: NEGATIVE
Protein, UA: NEGATIVE
Spec Grav, UA: 1.02 (ref 1.010–1.025)
Urobilinogen, UA: 0.2 E.U./dL
pH, UA: 7 (ref 5.0–8.0)

## 2021-06-07 LAB — POCT UA - MICROALBUMIN
Albumin/Creatinine Ratio, Urine, POC: 30
Creatinine, POC: 300 mg/dL
Microalbumin Ur, POC: 10 mg/L

## 2021-06-07 MED ORDER — CYANOCOBALAMIN 1000 MCG/ML IJ SOLN
1000.0000 ug | Freq: Once | INTRAMUSCULAR | Status: AC
  Administered 2021-06-07: 13:00:00 1000 ug via INTRAMUSCULAR

## 2021-06-07 NOTE — Progress Notes (Signed)
Subjective:    Sierra Glover is a 65 y.o. female who presents for a Welcome to Medicare exam. She reports compliance with meds. Denies headaches, chest pain and shortness of breath. She does admit to having some palpitations. These occur at rest and with movement. Sx seemed to have appeared after having COVID. Does not recall having similar sx prior to COVID.   Review of Systems Review of Systems  Constitutional:  Positive for malaise/fatigue.       She is more tired since having COVID.   HENT: Negative.    Eyes: Negative.   Respiratory: Negative.    Cardiovascular:  Positive for palpitations.  Gastrointestinal: Negative.   Genitourinary: Negative.   Musculoskeletal: Negative.   Skin: Negative.   Neurological: Negative.   Psychiatric/Behavioral: Negative.     Cardiac Risk Factors include: advanced age (>8men, >62 women), high blood pressure, high cholesterol, ethnicity, postmenopausal status.       Objective:    Today's Vitals   06/07/21 1135 06/07/21 1146 06/07/21 1147 06/07/21 1148  BP: 130/82 110/70 114/74 112/80  Pulse: 93 74 90 94  Temp: 98.2 F (36.8 C)     Weight: 143 lb (64.9 kg)     Height: 5\' 5"  (1.651 m)     PainSc: 0-No pain     Body mass index is 23.8 kg/m.  Medications Outpatient Encounter Medications as of 06/07/2021  Medication Sig   amLODipine (NORVASC) 5 MG tablet Take 1 tablet (5 mg total) by mouth daily.   atorvastatin (LIPITOR) 20 MG tablet Take 1 tablet (20 mg total) by mouth daily.   ciclopirox (PENLAC) 8 % solution Apply topically at bedtime. Apply over nail and surrounding skin. Apply daily over previous coat. After seven (7) days, may remove with alcohol and continue cycle. (Patient taking differently: Apply 1 application topically See admin instructions. Apply over nail and surrounding skin. Apply daily over previous coat. After seven (7) days, may remove with alcohol and continue cycle. If need)   fluticasone (FLONASE) 50 MCG/ACT nasal  spray SPRAY 1 SPRAY INTO EACH NOSTRIL EVERY DAY (Patient taking differently: Place 2 sprays into both nostrils daily as needed for allergies or rhinitis.)   loratadine (CLARITIN) 10 MG tablet Take 1 tablet (10 mg total) by mouth daily.   meclizine (ANTIVERT) 25 MG tablet Take 1 tablet (25 mg total) by mouth 2 (two) times daily as needed for dizziness.   ondansetron (ZOFRAN ODT) 8 MG disintegrating tablet Take 1 tablet (8 mg total) by mouth every 8 (eight) hours as needed for nausea or vomiting.   valACYclovir (VALTREX) 500 MG tablet take 1 tablet by oral route  every day   valsartan-hydrochlorothiazide (DIOVAN HCT) 160-12.5 MG tablet Take 1 tablet by mouth daily.   [DISCONTINUED] amLODipine (NORVASC) 2.5 MG tablet Take 1 tablet (2.5 mg total) by mouth daily. (Patient not taking: Reported on 05/31/2021)   [EXPIRED] cyanocobalamin ((VITAMIN B-12)) injection 1,000 mcg    No facility-administered encounter medications on file as of 06/07/2021.     History: Past Medical History:  Diagnosis Date   Anemia    before my hysterectomy   DVT (deep venous thrombosis) (HCC)    "Many, many years ago."   HTN (hypertension)    Past Surgical History:  Procedure Laterality Date   COLONOSCOPY     partal hysterectomy  2009   TIBIA IM NAIL INSERTION Right 01/07/2021   Procedure: INTRAMEDULLARY (IM) NAIL TIBIAL;  Surgeon: 01/09/2021, MD;  Location: [facility] OR;  Service: Orthopedics;  Laterality: Right;    Family History  Problem Relation Age of Onset   Alzheimer's disease Mother    Hypertension Mother    Hypertension Father    COPD Father    Hypertension Other    Social History   Occupational History   Not on file  Tobacco Use   Smoking status: Never   Smokeless tobacco: Never   Tobacco comments:    N/a  Vaping Use   Vaping Use: Never used  Substance and Sexual Activity   Alcohol use: Yes    Comment: social   Drug use: Never   Sexual activity: Yes    Tobacco Counseling Counseling  given: No Tobacco comments: N/a   Immunizations and Health Maintenance Immunization History  Administered Date(s) Administered   Moderna Sars-Covid-2 Vaccination 08/20/2019, 09/24/2019   Td 01/16/2007   Tdap 05/08/2018   Health Maintenance Due  Topic Date Due   Pneumonia Vaccine 57+ Years old (1 - PCV) Never done   Zoster Vaccines- Shingrix (1 of 2) Never done   COVID-19 Vaccine (3 - Moderna risk series) 10/22/2019   DEXA SCAN  11/24/2020    Activities of Daily Living In your present state of health, do you have any difficulty performing the following activities: 06/07/2021 01/07/2021  Hearing? N -  Vision? N -  Difficulty concentrating or making decisions? N -  Walking or climbing stairs? N -  Dressing or bathing? N -  Doing errands, shopping? N N  Preparing Food and eating ? N -  Using the Toilet? N -  In the past six months, have you accidently leaked urine? N -  Do you have problems with loss of bowel control? N -  Managing your Medications? N -  Managing your Finances? N -  Housekeeping or managing your Housekeeping? N -  Some recent data might be hidden    Physical Exam   Physical Exam Vitals and nursing note reviewed.  Constitutional:      Appearance: Normal appearance.  HENT:     Head: Normocephalic and atraumatic.     Right Ear: Tympanic membrane, ear canal and external ear normal.     Left Ear: Tympanic membrane, ear canal and external ear normal.     Nose:     Comments: Masked     Mouth/Throat:     Comments: Masked  Eyes:     Extraocular Movements: Extraocular movements intact.     Conjunctiva/sclera: Conjunctivae normal.     Pupils: Pupils are equal, round, and reactive to light.  Cardiovascular:     Rate and Rhythm: Normal rate and regular rhythm.     Pulses: Normal pulses.     Heart sounds: Normal heart sounds.  Pulmonary:     Effort: Pulmonary effort is normal.     Breath sounds: Normal breath sounds.  Chest:  Breasts:    Tanner Score is 5.      Right: Normal.     Left: Normal.  Abdominal:     General: Bowel sounds are normal.     Palpations: Abdomen is soft.  Musculoskeletal:        General: Normal range of motion.     Cervical back: Normal range of motion.  Skin:    General: Skin is warm and dry.  Neurological:     General: No focal deficit present.     Mental Status: She is alert and oriented to person, place, and time.  Psychiatric:  Mood and Affect: Mood normal.        Behavior: Behavior normal.    (optional), or other factors deemed appropriate based on the beneficiary's medical and social history and current clinical standards.  Advanced Directives: Does Patient Have a Medical Advance Directive?: No Would patient like information on creating a medical advance directive?: Yes (MAU/Ambulatory/Procedural Areas - Information given)    Assessment:    This is a routine wellness examination for this patient .   Vision/Hearing screen Hearing Screening   500Hz  1000Hz  2000Hz  4000Hz   Right ear 40 40 20 20  Left ear 25 40 20 20   Vision Screening   Right eye Left eye Both eyes  Without correction 20/30-1 20/20-1 20/20  With correction       Dietary issues and exercise activities discussed:  Current Exercise Habits: Home exercise routine, Type of exercise: strength training/weights;walking, Time (Minutes): 30, Frequency (Times/Week): 5, Weekly Exercise (Minutes/Week): 150, Intensity: Moderate, Exercise limited by: orthopedic condition(s) (recently had leg fx - gradually getting back to exercise)   Goals      DIET - INCREASE WATER INTAKE     Exercise 150 min/wk Moderate Activity       Depression Screen PHQ 2/9 Scores 05/19/2021 11/13/2019 11/06/2018 05/08/2018  PHQ - 2 Score 0 0 0 0  PHQ- 9 Score 0 - - -     Fall Risk Fall Risk  06/07/2021  Falls in the past year? 0  Number falls in past yr: 0  Injury with Fall? 0    Cognitive Function:     6CIT Screen 06/07/2021  What Year? 0 points  What  month? 0 points  What time? 0 points  Count back from 20 0 points  Months in reverse 0 points  Repeat phrase 0 points  Total Score 0    Patient Care Team: Glendale Chard, MD as PCP - General (Internal Medicine)     Plan:   1. Encounter for Medicare annual wellness exam Comments: The annual wellness visit was performed including discussion of advanced directives, assessment of functional status and cognitive function.  EKG performed, NSR w/ nonspecific T abnormality - no new changes. A full exam was also performed. She will rto in one year for AWV with [facility] Advisor. PATIENT IS ADVISED TO GET 30-45 MINUTES REGULAR EXERCISE NO LESS THAN FOUR TO FIVE DAYS PER WEEK - BOTH WEIGHTBEARING EXERCISES AND AEROBIC ARE RECOMMENDED.  PATIENT IS ADVISED TO FOLLOW A HEALTHY DIET WITH AT LEAST SIX FRUITS/VEGGIES PER DAY, DECREASE INTAKE OF RED MEAT, AND TO INCREASE FISH INTAKE TO TWO DAYS PER WEEK.  MEATS/FISH SHOULD NOT BE FRIED, BAKED OR BROILED IS PREFERABLE.  IT IS ALSO IMPORTANT TO CUT BACK ON YOUR SUGAR INTAKE. PLEASE AVOID ANYTHING WITH ADDED SUGAR, CORN SYRUP OR OTHER SWEETENERS. IF YOU MUST USE A SWEETENER, YOU CAN TRY STEVIA. IT IS ALSO IMPORTANT TO AVOID ARTIFICIALLY SWEETENERS AND DIET BEVERAGES. LASTLY, I SUGGEST WEARING SPF 50 SUNSCREEN ON EXPOSED PARTS AND ESPECIALLY WHEN IN THE DIRECT SUNLIGHT FOR AN EXTENDED PERIOD OF TIME.  PLEASE AVOID FAST FOOD RESTAURANTS AND INCREASE YOUR WATER INTAKE. - EKG 12-Lead  2. Essential hypertension, benign Comments: Chronic, well controlled. EKG performed, NSR w/ nonspecific T abn, no new changes. No med changes today. Encouraged to folllow low sodium diet. She will f/u in 4-6 months.  - POCT Urinalysis Dipstick (81002) - POCT UA - Microalbumin - CMP14+EGFR - Lipid panel  3. Pure hypercholesterolemia Comments: Chronic, she is currently taking atorvastatin 20mg   daily. Importance of statin compliance was d/w patient in full detail.   4.  Palpitations Comments: Intermittent, experiencing post-COVID. She is encouraged to stay well hydrated. She may benefit from magnesium supplementation.  I will check MG levels. Since this is persistent, she agrees to Cardiology evaluation.  - Magnesium - Ambulatory referral to Cardiology  5. Long COVID Comments: We had a long discussion regarding long COVID and that it presents itself differently in everyone. She is encouraged to follow clean diet, free of processed foods/excess sugars and to stay well hydrated. She agrees to vitamin B12 IM x 1. She will let me know if she has any relief from this. If so, may consider weekly injections x 4, then monthly.  - cyanocobalamin ((VITAMIN B-12)) injection 1,000 mcg  6. Influenza vaccination declined  7. Drug therapy - Vitamin B12    I have personally reviewed and noted the following in the patients chart:   Medical and social history Use of alcohol, tobacco or illicit drugs  Current medications and supplements Functional ability and status Nutritional status Physical activity Advanced directives List of other physicians Hospitalizations, surgeries, and ER visits in previous 12 months Vitals Screenings to include cognitive, depression, and falls Referrals and appointments  In addition, I have reviewed and discussed with patient certain preventive protocols, quality metrics, and best practice recommendations. A written personalized care plan for preventive services as well as general preventive health recommendations were provided to patient.     [name], MD 06/20/2021

## 2021-06-07 NOTE — Patient Instructions (Addendum)
Magnesium glycinate, one capsule daily ( or as directed)  L-theanine - can be used for anxiety  Ms. Sierra Glover , Thank you for taking time to come for your Medicare Wellness Visit. I appreciate your ongoing commitment to your health goals. Please review the following plan we discussed and let me know if I can assist you in the future.   These are the goals we discussed:  Goals      DIET - INCREASE WATER INTAKE     Exercise 150 min/wk Moderate Activity        This is a list of the screening recommended for you and due dates:  Health Maintenance  Topic Date Due   Pneumonia Vaccine (1 - PCV) Never done   HIV Screening  Never done   Zoster (Shingles) Vaccine (1 of 2) Never done   COVID-19 Vaccine (3 - Booster for Moderna series) 11/19/2019   DEXA scan (bone density measurement)  11/24/2020   Flu Shot  09/16/2021*   Pap Smear  05/19/2022   Mammogram  07/02/2022   Colon Cancer Screening  05/26/2026   Tetanus Vaccine  05/08/2028   Hepatitis C Screening: USPSTF Recommendation to screen - Ages 18-79 yo.  Completed   HPV Vaccine  Aged Out  *Topic was postponed. The date shown is not the original due date.

## 2021-06-08 ENCOUNTER — Encounter: Payer: Self-pay | Admitting: Internal Medicine

## 2021-06-08 LAB — CMP14+EGFR
ALT: 16 IU/L (ref 0–32)
AST: 19 IU/L (ref 0–40)
Albumin/Globulin Ratio: 1.5 (ref 1.2–2.2)
Albumin: 4.7 g/dL (ref 3.8–4.8)
Alkaline Phosphatase: 114 IU/L (ref 44–121)
BUN/Creatinine Ratio: 9 — ABNORMAL LOW (ref 12–28)
BUN: 8 mg/dL (ref 8–27)
Bilirubin Total: 0.9 mg/dL (ref 0.0–1.2)
CO2: 30 mmol/L — ABNORMAL HIGH (ref 20–29)
Calcium: 10.5 mg/dL — ABNORMAL HIGH (ref 8.7–10.3)
Chloride: 99 mmol/L (ref 96–106)
Creatinine, Ser: 0.85 mg/dL (ref 0.57–1.00)
Globulin, Total: 3.1 g/dL (ref 1.5–4.5)
Glucose: 88 mg/dL (ref 70–99)
Potassium: 3.6 mmol/L (ref 3.5–5.2)
Sodium: 145 mmol/L — ABNORMAL HIGH (ref 134–144)
Total Protein: 7.8 g/dL (ref 6.0–8.5)
eGFR: 76 mL/min/{1.73_m2} (ref >59)

## 2021-06-08 LAB — LIPID PANEL
Chol/HDL Ratio: 2.5 ratio (ref 0.0–4.4)
Cholesterol, Total: 147 mg/dL (ref 100–199)
HDL: 60 mg/dL (ref >39)
LDL Chol Calc (NIH): 70 mg/dL (ref 0–99)
Triglycerides: 92 mg/dL (ref 0–149)
VLDL Cholesterol Cal: 17 mg/dL (ref 5–40)

## 2021-06-08 LAB — MAGNESIUM: Magnesium: 2.1 mg/dL (ref 1.6–2.3)

## 2021-06-08 LAB — VITAMIN B12: Vitamin B-12: 1111 pg/mL (ref 232–1245)

## 2021-06-16 ENCOUNTER — Encounter: Payer: Self-pay | Admitting: Internal Medicine

## 2021-06-16 ENCOUNTER — Other Ambulatory Visit: Payer: Self-pay | Admitting: Internal Medicine

## 2021-06-20 ENCOUNTER — Encounter: Payer: Self-pay | Admitting: Internal Medicine

## 2021-06-22 NOTE — Progress Notes (Signed)
Cardiology Office Note:    Date:  06/23/2021   ID:  Sierra Glover, DOB 03/07/1956, MRN [id number]  PCP:  Sierra PengSanders, Robyn, MD   [facility] Providers Cardiologist:  None     Referring MD: Sierra PengSanders, Robyn, MD   CC: BP elevation post COVID-19 Consulted for the evaluation of palpitations at the behest of Sierra PengSanders, Robyn, MD  History of Present Illness:    Sierra Glover is a 66 y.o. female with a hx of HTN, Remote DVT, who presents for evaluation 06/23/21.  Patient notes that she is feeling a hard recovery after COVID-19.  Notes that since she had COVID-19 this winter, she had some chest pain with blood pressures in the 180s.  Found to have normal studies 05/22/21.    Still has lingering chest pain, last this AM.  It is a hard feeling to explain does not radiated but occurs in different parts of her chest.  Improves with deep breaths. Patient exertion notable for walking feels no symptoms (is recovering from her borken leg with surgery 01/07/21).  No shortness of breath but is more mindful of her breathing.Sierra Glover. Feels like she has trouble getting a good breath in about every day. No PND or orthopnea.  No weight gain, leg swelling , or abdominal swelling.  No syncope or but felt light headed on 05/22/21. Marland Kitchen.   Notes that those breathing spells where she has to take a deep breath, feels palpitations.  Hasn't had any of these in the past two weeks.   Past Medical History:  Diagnosis Date   Anemia    before my hysterectomy   Dizziness    DVT (deep venous thrombosis) (HCC)    "Many, many years ago."   HTN (hypertension)    Nausea    Palpitation    Serum calcium elevated     Past Surgical History:  Procedure Laterality Date   COLONOSCOPY     partal hysterectomy  2009   TIBIA IM NAIL INSERTION Right 01/07/2021   Procedure: INTRAMEDULLARY (IM) NAIL TIBIAL;  Surgeon: Roby LoftsHaddix, Kevin P, MD;  Location: [facility] OR;  Service: Orthopedics;  Laterality: Right;    Current Medications: Current Meds   Medication Sig   amLODipine (NORVASC) 5 MG tablet Take 1 tablet (5 mg total) by mouth daily.   atorvastatin (LIPITOR) 20 MG tablet Take 1 tablet (20 mg total) by mouth daily.   ciclopirox (PENLAC) 8 % solution Apply topically at bedtime. Apply over nail and surrounding skin. Apply daily over previous coat. After seven (7) days, may remove with alcohol and continue cycle. (Patient taking differently: Apply 1 application topically See admin instructions. Apply over nail and surrounding skin. Apply daily over previous coat. After seven (7) days, may remove with alcohol and continue cycle. If need)   fluticasone (FLONASE) 50 MCG/ACT nasal spray SPRAY 1 SPRAY INTO EACH NOSTRIL EVERY DAY (Patient taking differently: Place 2 sprays into both nostrils daily as needed for allergies or rhinitis.)   loratadine (CLARITIN) 10 MG tablet Take 1 tablet (10 mg total) by mouth daily.   meclizine (ANTIVERT) 25 MG tablet Take 1 tablet (25 mg total) by mouth 2 (two) times daily as needed for dizziness.   ondansetron (ZOFRAN ODT) 8 MG disintegrating tablet Take 1 tablet (8 mg total) by mouth every 8 (eight) hours as needed for nausea or vomiting.   valACYclovir (VALTREX) 500 MG tablet take 1 tablet by oral route  every day (Patient taking differently: as needed. take 1 tablet by oral  route  every day)   valsartan-hydrochlorothiazide (DIOVAN-HCT) 160-12.5 MG tablet TAKE 1 TABLET BY MOUTH EVERY DAY     Allergies:   Patient has no known allergies.   Social History   Socioeconomic History   Marital status: Married    Spouse name: Not on file   Number of children: Not on file   Years of education: Not on file   Highest education level: Not on file  Occupational History   Not on file  Tobacco Use   Smoking status: Never   Smokeless tobacco: Never   Tobacco comments:    N/a  Vaping Use   Vaping Use: Never used  Substance and Sexual Activity   Alcohol use: Yes    Comment: social   Drug use: Never   Sexual  activity: Yes  Other Topics Concern   Not on file  Social History Narrative   Not on file   Social Determinants of Health   Financial Resource Strain: Not on file  Food Insecurity: No Food Insecurity   Worried About Running Out of Food in the Last Year: Never true   Ran Out of Food in the Last Year: Never true  Transportation Needs: Not on file  Physical Activity: Not on file  Stress: Not on file  Social Connections: Not on file     Family History: The patient's family history includes Alzheimer's disease in her mother; COPD in her father; Hypertension in her father, mother, and another family member. No family history of heart disease.  ROS:   Please see the history of present illness.     All other systems reviewed and are negative.  EKGs/Labs/Other Studies Reviewed:    The following studies were reviewed today:  EKG:  EKG is  ordered today.  The ekg ordered today demonstrates  06/23/20: Sr rate 74 WNL  Recent Labs: 05/19/2021: TSH 1.470 05/22/2021: Hemoglobin 13.0; Platelets 318 06/07/2021: ALT 16; BUN 8; Creatinine, Ser 0.85; Magnesium 2.1; Potassium 3.6; Sodium 145  Recent Lipid Panel    Component Value Date/Time   CHOL 147 06/07/2021 1234   TRIG 92 06/07/2021 1234   HDL 60 06/07/2021 1234   CHOLHDL 2.5 06/07/2021 1234   LDLCALC 70 06/07/2021 1234       Physical Exam:    VS:  BP 120/90    Pulse 74    Ht 5\' 7"  (1.702 m)    Wt 64.7 kg    SpO2 99%    BMI 22.33 kg/m     Wt Readings from Last 3 Encounters:  06/23/21 64.7 kg  06/07/21 64.9 kg  05/31/21 65.5 kg   Gen: No distress  Neck: No JVD Cardiac: No Rubs or Gallops, no Murmur, regular rhythm , +2 radial pulses Respiratory: Clear to auscultation bilaterally, normal effort, normal  respiratory rate GI: Soft, nontender, non-distended  MS: No  edema;  moves all extremities Integument: Skin feels warm Neuro:  At time of evaluation, alert and oriented to person/place/time/situation  Psych: Normal affect,  patient feels well   ASSESSMENT:    1. Chest pain of uncertain etiology   2. Palpitations   3. Precordial pain    PLAN:     Chest Pain Palpitations HTN HLD COVID-19 - atypical chest pain with risk factors (HTN and HLD) - if diastolic BP is persistently elevated will increase norvasc from 5 to 10 - no change to atorvastatin 20 mg - will do lexiscan (recovering from broken R leg) - will do 14 day non live ziopatch -  all of this may be COVID related - will see in 3 months in [place]      Shared Decision Making/Informed Consent The risks [chest pain, shortness of breath, cardiac arrhythmias, dizziness, blood pressure fluctuations, myocardial infarction, stroke/transient ischemic attack, nausea, vomiting, allergic reaction, radiation exposure, metallic taste sensation and life-threatening complications (estimated to be 1 in 10,000)], benefits (risk stratification, diagnosing coronary artery disease, treatment guidance) and alternatives of a nuclear stress test were discussed in detail with Ms. Jeffcoat and she agrees to proceed.    Medication Adjustments/Labs and Tests Ordered: Current medicines are reviewed at length with the patient today.  Concerns regarding medicines are outlined above.  Orders Placed This Encounter  Procedures   Cardiac Stress Test: Informed Consent Details: Physician/Practitioner Attestation; Transcribe to consent form and obtain patient signature   LONG TERM MONITOR (3-14 DAYS)   MYOCARDIAL PERFUSION IMAGING   EKG 12-Lead   No orders of the defined types were placed in this encounter.      Signed, Christell Constant, MD  06/23/2021 4:30 PM    [facility]

## 2021-06-23 ENCOUNTER — Ambulatory Visit: Payer: Medicare Other

## 2021-06-23 ENCOUNTER — Other Ambulatory Visit: Payer: Self-pay

## 2021-06-23 ENCOUNTER — Ambulatory Visit: Payer: Medicare Other | Admitting: Internal Medicine

## 2021-06-23 ENCOUNTER — Other Ambulatory Visit: Payer: Self-pay | Admitting: Internal Medicine

## 2021-06-23 ENCOUNTER — Ambulatory Visit (INDEPENDENT_AMBULATORY_CARE_PROVIDER_SITE_OTHER): Payer: Medicare Other

## 2021-06-23 VITALS — BP 120/90 | HR 74 | Ht 67.0 in | Wt 142.6 lb

## 2021-06-23 DIAGNOSIS — R079 Chest pain, unspecified: Secondary | ICD-10-CM

## 2021-06-23 DIAGNOSIS — I10 Essential (primary) hypertension: Secondary | ICD-10-CM

## 2021-06-23 DIAGNOSIS — R002 Palpitations: Secondary | ICD-10-CM

## 2021-06-23 DIAGNOSIS — R072 Precordial pain: Secondary | ICD-10-CM | POA: Diagnosis not present

## 2021-06-23 NOTE — Patient Instructions (Addendum)
Medication Instructions:  Your physician recommends that you continue on your current medications as directed. Please refer to the Current Medication list given to you today.  *If you need a refill on your cardiac medications before your next appointment, please call your pharmacy*   Lab Work: NONE If you have labs (blood work) drawn today and your tests are completely normal, you will receive your results only by: MyChart Message (if you have MyChart) OR A paper copy in the mail If you have any lab test that is abnormal or we need to change your treatment, we will call you to review the results.   Testing/Procedures: Your physician has requested that you wear a heart monitor.   Your physician has requested that you have a lexiscan myoview. For further information please visit [email or web address] Please follow instruction sheet, as given.    Follow-Up: At [facility], you and your health needs are our priority.  As part of our continuing mission to provide you with exceptional heart care, we have created designated Provider Care Teams.  These Care Teams include your primary Cardiologist (physician) and Advanced Practice Providers (APPs -  Physician Assistants and Nurse Practitioners) who all work together to provide you with the care you need, when you need it.   Your next appointment:   3 month(s)  The format for your next appointment:   In Person  Provider:   You may see Sierra Lam, MD    Other Instructions  You are scheduled for a Myocardial Perfusion Imaging Study. Please arrive 15 minutes prior to your appointment time for registration and insurance purposes.   The test will take approximately 3 to 4 hours to complete; you may bring reading material.  If someone comes with you to your appointment, they will need to remain in the main lobby due to limited space in the testing area.    How to prepare for your Myocardial Perfusion Test: Do not eat or drink 3  hours prior to your test, except you may have water. Do not consume products containing caffeine (regular or decaffeinated) 12 hours prior to your test. (ex: coffee, chocolate, sodas, tea). Do bring a list of your current medications with you.  If not listed, you may take your medications as normal. Do wear comfortable clothes (no dresses or overalls) and walking shoes, tennis shoes preferred (No heels or open toe shoes are allowed). Do NOT wear cologne, perfume, aftershave, or lotions (deodorant is allowed). If these instructions are not followed, your test will have to be rescheduled.  If you cannot keep your appointment, please provide 24 hours notification to the Nuclear Lab, to avoid a possible $50 charge to your account.      ZIO XT- Long Term Monitor Instructions  Your physician has requested you wear a ZIO patch monitor for 14 days.  This is a single patch monitor. [employer] supplies one patch monitor per enrollment. Additional stickers are not available. Please do not apply patch if you will be having a Nuclear Stress Test,  Echocardiogram, Cardiac CT, MRI, or Chest Xray during the period you would be wearing the  monitor. The patch cannot be worn during these tests. You cannot remove and re-apply the  ZIO XT patch monitor.  Your ZIO patch monitor will be mailed 3 day USPS to your address on file. It may take 3-5 days  to receive your monitor after you have been enrolled.  Once you have received your monitor, please review the enclosed instructions. Your monitor  has already been registered assigning a specific monitor serial # to you.  Billing and Patient Assistance Program Information  We have supplied [employer] with any of your insurance information on file for billing purposes. [employer] offers a sliding scale Patient Assistance Program for patients that do not have  insurance, or whose insurance does not completely cover the cost of the ZIO monitor.  You must apply for the  Patient Assistance Program to qualify for this discounted rate.  To apply, please call [employer] at [phone], select option 4, select option 2, ask to apply for  Patient Assistance Program. Meredeth Ide will ask your household income, and how many people  are in your household. They will quote your out-of-pocket cost based on that information.  [employer] will also be able to set up a 20-month, interest-free payment plan if needed.  Applying the monitor   Shave hair from upper left chest.  Hold abrader disc by orange tab. Rub abrader in 40 strokes over the upper left chest as  indicated in your monitor instructions.  Clean area with 4 enclosed alcohol pads. Let dry.  Apply patch as indicated in monitor instructions. Patch will be placed under collarbone on left  side of chest with arrow pointing upward.  Rub patch adhesive wings for 2 minutes. Remove white label marked "1". Remove the white  label marked "2". Rub patch adhesive wings for 2 additional minutes.  While looking in a mirror, press and release button in center of patch. A small green light will  flash 3-4 times. This will be your only indicator that the monitor has been turned on.  Do not shower for the first 24 hours. You may shower after the first 24 hours.  Press the button if you feel a symptom. You will hear a small click. Record Date, Time and  Symptom in the Patient Logbook.  When you are ready to remove the patch, follow instructions on the last 2 pages of Patient  Logbook. Stick patch monitor onto the last page of Patient Logbook.  Place Patient Logbook in the blue and white box. Use locking tab on box and tape box closed  securely. The blue and white box has prepaid postage on it. Please place it in the mailbox as  soon as possible. Your physician should have your test results approximately 7 days after the  monitor has been mailed back to [facility].  Call [facility] Customer Care at [phone] if you have  questions regarding  your ZIO XT patch monitor. Call them immediately if you see an orange light blinking on your  monitor.  If your monitor falls off in less than 4 days, contact our Monitor department at [phone].  If your monitor becomes loose or falls off after 4 days call [employer] at [phone] for  suggestions on securing your monitor

## 2021-06-23 NOTE — Progress Notes (Unsigned)
Enrolled for [employer] to mail a ZIO XT long term holter monitor to the patients address on file.  

## 2021-06-24 ENCOUNTER — Telehealth (HOSPITAL_COMMUNITY): Payer: Self-pay | Admitting: *Deleted

## 2021-06-24 NOTE — Telephone Encounter (Signed)
Patient given detailed instructions per Myocardial Perfusion Study Information Sheet for the test on 06/29/2021 at 7:45. Patient notified to arrive 15 minutes early and that it is imperative to arrive on time for appointment to keep from having the test rescheduled.  If you need to cancel or reschedule your appointment, please call the office within 24 hours of your appointment. . Patient verbalized understanding.Sierra Glover

## 2021-06-27 ENCOUNTER — Encounter: Payer: Self-pay | Admitting: Internal Medicine

## 2021-06-28 ENCOUNTER — Other Ambulatory Visit: Payer: Self-pay

## 2021-06-28 ENCOUNTER — Encounter: Payer: Self-pay | Admitting: Internal Medicine

## 2021-06-28 ENCOUNTER — Ambulatory Visit: Payer: Medicare Other | Admitting: Internal Medicine

## 2021-06-28 ENCOUNTER — Telehealth: Payer: Self-pay | Admitting: Cardiology

## 2021-06-28 VITALS — BP 130/88 | HR 75 | Temp 98.1°F | Ht 67.0 in | Wt 142.4 lb

## 2021-06-28 DIAGNOSIS — U099 Post covid-19 condition, unspecified: Secondary | ICD-10-CM | POA: Insufficient documentation

## 2021-06-28 DIAGNOSIS — I10 Essential (primary) hypertension: Secondary | ICD-10-CM | POA: Diagnosis not present

## 2021-06-28 MED ORDER — CYANOCOBALAMIN 1000 MCG/ML IJ SOLN
1000.0000 ug | Freq: Once | INTRAMUSCULAR | Status: AC
  Administered 2021-06-28: 1000 ug via INTRAMUSCULAR

## 2021-06-28 NOTE — Telephone Encounter (Signed)
nurse apt tomorrow 415 pm

## 2021-06-28 NOTE — Patient Instructions (Signed)

## 2021-06-28 NOTE — Telephone Encounter (Signed)
Patient wants to know if there is someone in the office that can help her apply her zio patch monitor.

## 2021-06-28 NOTE — Progress Notes (Signed)
I,[name],acting as a scribe for [name], MD.,have documented all relevant documentation on the behalf of [name], MD,as directed by  [name], MD while in the presence of [name], MD.  This visit occurred during the SARS-CoV-2 public health emergency.  Safety protocols were in place, including screening questions prior to the visit, additional usage of staff PPE, and extensive cleaning of exam room while observing appropriate contact time as indicated for disinfecting solutions.  Subjective:     Patient ID: Sierra Glover , female    DOB: 1955-10-27 , 66 y.o.   MRN: [id number]   Chief Complaint  Patient presents with   Hypertension    HPI  Pt presents today for HTN f/u. She currently takes Amlodipine 5MG  & valsartan hydrochlorothiazide 160-12.5MG . She reports taking med before appt this morning.   She has brought in her at home BP monitor: 140/88 pulse: 8r. She purchased this machine a day before Christmas. She is happy to report her dizziness has improved. She does feel better since B12 injection was given at her last visit.   Hypertension This is a chronic problem. The current episode started more than 1 year ago. The problem has been gradually improving since onset. The problem is controlled. Pertinent negatives include no blurred vision, chest pain, palpitations or shortness of breath. Past treatments include diuretics. The current treatment provides moderate improvement.    Past Medical History:  Diagnosis Date   Anemia    before my hysterectomy   Dizziness    DVT (deep venous thrombosis) (HCC)    "Many, many years ago."   HTN (hypertension)    Nausea    Palpitation    Serum calcium elevated      Family History  Problem Relation Age of Onset   Alzheimer's disease Mother    Hypertension Mother    Hypertension Father    COPD Father    Hypertension Other      Current Outpatient Medications:    amLODipine (NORVASC) 5 MG  tablet, Take 1 tablet (5 mg total) by mouth daily., Disp: 90 tablet, Rfl: 1   atorvastatin (LIPITOR) 20 MG tablet, Take 1 tablet (20 mg total) by mouth daily., Disp: 30 tablet, Rfl: 11   ciclopirox (PENLAC) 8 % solution, Apply topically at bedtime. Apply over nail and surrounding skin. Apply daily over previous coat. After seven (7) days, may remove with alcohol and continue cycle. (Patient taking differently: Apply 1 application topically See admin instructions. Apply over nail and surrounding skin. Apply daily over previous coat. After seven (7) days, may remove with alcohol and continue cycle. If need), Disp: 6.6 mL, Rfl: 0   fluticasone (FLONASE) 50 MCG/ACT nasal spray, SPRAY 1 SPRAY INTO EACH NOSTRIL EVERY DAY (Patient taking differently: Place 2 sprays into both nostrils daily as needed for allergies or rhinitis.), Disp: 48 mL, Rfl: 1   loratadine (CLARITIN) 10 MG tablet, Take 1 tablet (10 mg total) by mouth daily., Disp: 30 tablet, Rfl: 2   meclizine (ANTIVERT) 25 MG tablet, Take 1 tablet (25 mg total) by mouth 2 (two) times daily as needed for dizziness., Disp: 30 tablet, Rfl: 0   ondansetron (ZOFRAN ODT) 8 MG disintegrating tablet, Take 1 tablet (8 mg total) by mouth every 8 (eight) hours as needed for nausea or vomiting., Disp: 20 tablet, Rfl: 0   valACYclovir (VALTREX) 500 MG tablet, take 1 tablet by oral route  every day (Patient taking differently: as needed. take 1  tablet by oral route  every day), Disp: 90 tablet, Rfl: 0   valsartan-hydrochlorothiazide (DIOVAN-HCT) 160-12.5 MG tablet, TAKE 1 TABLET BY MOUTH EVERY DAY, Disp: 90 tablet, Rfl: 1   No Known Allergies   Review of Systems  Constitutional: Negative.   Eyes:  Negative for blurred vision.  Respiratory: Negative.  Negative for shortness of breath.   Cardiovascular: Negative.  Negative for chest pain and palpitations.  Neurological: Negative.   Psychiatric/Behavioral: Negative.      Today's Vitals   06/28/21 0944 06/28/21  1016  BP: (!) 124/96 130/88  Pulse: 85 75  Temp: 98.1 F (36.7 C)   Weight: 142 lb 6.4 oz (64.6 kg)   Height: 5\' 7"  (1.702 m)    Body mass index is 22.3 kg/m.  Wt Readings from Last 3 Encounters:  06/28/21 142 lb 6.4 oz (64.6 kg)  06/23/21 142 lb 9.6 oz (64.7 kg)  06/07/21 143 lb (64.9 kg)    Objective:  Physical Exam Vitals and nursing note reviewed.  Constitutional:      Appearance: Normal appearance.  HENT:     Head: Normocephalic and atraumatic.     Nose:     Comments: Masked     Mouth/Throat:     Comments: Masked  Eyes:     Extraocular Movements: Extraocular movements intact.  Cardiovascular:     Rate and Rhythm: Normal rate and regular rhythm.     Heart sounds: Normal heart sounds.  Pulmonary:     Effort: Pulmonary effort is normal.     Breath sounds: Normal breath sounds.  Musculoskeletal:     Cervical back: Normal range of motion.  Skin:    General: Skin is warm.  Neurological:     General: No focal deficit present.     Mental Status: She is alert.  Psychiatric:        Mood and Affect: Mood normal.        Behavior: Behavior normal.        Assessment And Plan:     1. Essential hypertension, benign Comments: Chronic, fair control. Repeat bp 130/88. NO med changes today. Cardiology notes reviewed, input appreciated. She will f/u in June 2023.   2. Long COVID Comments: She is reminded to stay well hydrated, gradually increase daily activity and to get plenty of rest.  - cyanocobalamin ((VITAMIN B-12)) injection 1,000 mcg   Patient was given opportunity to ask questions. Patient verbalized understanding of the plan and was able to repeat key elements of the plan. All questions were answered to their satisfaction.   I, July 2023, MD, have reviewed all documentation for this visit. The documentation on 06/28/21 for the exam, diagnosis, procedures, and orders are all accurate and complete.   IF YOU HAVE BEEN REFERRED TO A SPECIALIST, IT MAY TAKE 1-2  WEEKS TO SCHEDULE/PROCESS THE REFERRAL. IF YOU HAVE NOT HEARD FROM US/SPECIALIST IN TWO WEEKS, PLEASE GIVE 08/26/21 A CALL AT [phone] X [phone].   THE PATIENT IS ENCOURAGED TO PRACTICE SOCIAL DISTANCING DUE TO THE COVID-19 PANDEMIC.

## 2021-06-29 ENCOUNTER — Ambulatory Visit (INDEPENDENT_AMBULATORY_CARE_PROVIDER_SITE_OTHER): Payer: Medicare Other

## 2021-06-29 ENCOUNTER — Encounter (HOSPITAL_COMMUNITY): Payer: Medicare Other

## 2021-06-29 DIAGNOSIS — R072 Precordial pain: Secondary | ICD-10-CM | POA: Diagnosis not present

## 2021-06-29 DIAGNOSIS — R002 Palpitations: Secondary | ICD-10-CM | POA: Diagnosis not present

## 2021-06-29 DIAGNOSIS — R079 Chest pain, unspecified: Secondary | ICD-10-CM | POA: Diagnosis not present

## 2021-06-29 NOTE — Progress Notes (Signed)
Patient is present today for placement of Zio patch- ordered by Dr. Gasper Sells for palpitations and chest pain.

## 2021-06-29 NOTE — Addendum Note (Signed)
Addended by: Leonides Schanz C on: 06/29/2021 04:39 PM   Modules accepted: Level of Service

## 2021-07-06 NOTE — Telephone Encounter (Signed)
Error

## 2021-07-07 ENCOUNTER — Telehealth (HOSPITAL_COMMUNITY): Payer: Self-pay

## 2021-07-07 NOTE — Telephone Encounter (Signed)
Spoke with the patient, detailed instructions given. She stated that she would be here for her test. Asked to call back with any questions. [name] EMTP 

## 2021-07-12 ENCOUNTER — Ambulatory Visit: Payer: Medicare Other | Admitting: Cardiology

## 2021-07-14 ENCOUNTER — Other Ambulatory Visit: Payer: Self-pay

## 2021-07-14 ENCOUNTER — Ambulatory Visit (HOSPITAL_COMMUNITY): Payer: Medicare Other | Attending: Cardiology

## 2021-07-14 DIAGNOSIS — R072 Precordial pain: Secondary | ICD-10-CM | POA: Diagnosis present

## 2021-07-14 LAB — MYOCARDIAL PERFUSION IMAGING
Angina Index: 0
Duke Treadmill Score: 6
Estimated workload: 7
Exercise duration (min): 6 min
Exercise duration (sec): 1 s
LV dias vol: 62 mL (ref 46–106)
LV sys vol: 17 mL
MPHR: 155 {beats}/min
Nuc Stress EF: 73 %
Peak HR: 151 {beats}/min
Percent HR: 97 %
RPE: 18
Rest HR: 77 {beats}/min
Rest Nuclear Isotope Dose: 10.1 mCi
SDS: 1
SRS: 0
SSS: 1
ST Depression (mm): 0 mm
Stress Nuclear Isotope Dose: 31.5 mCi
TID: 1.08

## 2021-07-14 MED ORDER — TECHNETIUM TC 99M TETROFOSMIN IV KIT
10.1000 | PACK | Freq: Once | INTRAVENOUS | Status: AC | PRN
  Administered 2021-07-14: 10.1 via INTRAVENOUS
  Filled 2021-07-14: qty 11

## 2021-07-14 MED ORDER — TECHNETIUM TC 99M TETROFOSMIN IV KIT
31.5000 | PACK | Freq: Once | INTRAVENOUS | Status: AC | PRN
  Administered 2021-07-14: 31.5 via INTRAVENOUS
  Filled 2021-07-14: qty 32

## 2021-07-20 ENCOUNTER — Other Ambulatory Visit: Payer: Self-pay | Admitting: Internal Medicine

## 2021-07-20 DIAGNOSIS — Z1231 Encounter for screening mammogram for malignant neoplasm of breast: Secondary | ICD-10-CM

## 2021-07-20 DIAGNOSIS — E2839 Other primary ovarian failure: Secondary | ICD-10-CM

## 2021-07-25 ENCOUNTER — Encounter: Payer: Self-pay | Admitting: Internal Medicine

## 2021-07-26 ENCOUNTER — Ambulatory Visit: Payer: Medicare Other

## 2021-07-28 ENCOUNTER — Other Ambulatory Visit: Payer: Self-pay

## 2021-07-28 MED ORDER — AMLODIPINE BESYLATE 10 MG PO TABS: 10.0000 mg | ORAL_TABLET | Freq: Every day | ORAL | 3 refills | Status: DC

## 2021-08-16 ENCOUNTER — Ambulatory Visit (INDEPENDENT_AMBULATORY_CARE_PROVIDER_SITE_OTHER): Payer: Medicare Other

## 2021-08-16 ENCOUNTER — Other Ambulatory Visit: Payer: Self-pay

## 2021-08-16 VITALS — BP 128/68 | HR 66 | Temp 98.4°F | Ht 67.0 in | Wt 149.0 lb

## 2021-08-16 DIAGNOSIS — U099 Post covid-19 condition, unspecified: Secondary | ICD-10-CM

## 2021-08-16 MED ORDER — CYANOCOBALAMIN 1000 MCG/ML IJ SOLN
1000.0000 ug | Freq: Once | INTRAMUSCULAR | Status: AC
  Administered 2021-08-16: 1000 ug via INTRAMUSCULAR

## 2021-08-16 NOTE — Progress Notes (Signed)
Patient is here for her monthly b12 injection. Patient states that she is feeling better. Today will be her last monthly injection. She will continue to take her supplement at home.

## 2021-08-16 NOTE — Patient Instructions (Signed)
Vitamin B12 Deficiency Vitamin B12 deficiency means that your body does not have enough vitamin B12. The body needs this important vitamin: To make red blood cells. To make genes (DNA). To help the nerves work. If you do not have enough vitamin B12 in your body, you can have health problems, such as not having enough red blood cells in the blood (anemia). What are the causes? Not eating enough foods that contain vitamin B12. Not being able to take in (absorb) vitamin B12 from the food that you eat. Certain diseases. A condition in which the body does not make enough of a certain protein. This results in your body not taking in enough vitamin B12. Having a surgery in which part of the stomach or small intestine is taken out. Taking medicines that make it hard for the body to take in vitamin B12. These include: Heartburn medicines. Some medicines that are used to treat diabetes. What increases the risk? Being an older adult. Eating a vegetarian or vegan diet that does not include any foods that come from animals. Not eating enough foods that contain vitamin B12 while you are pregnant. Taking certain medicines. Having alcoholism. What are the signs or symptoms? In some cases, there are no symptoms. If the condition leads to too few blood cells or nerve damage, symptoms can occur, such as: Feeling weak or tired. Not being hungry. Losing feeling (numbness) or tingling in your hands and feet. Redness and burning of the tongue. Feeling sad (depressed). Confusion or memory problems. Trouble walking. If anemia is very bad, symptoms can include: Being short of breath. Being dizzy. Having a very fast heartbeat. How is this treated? Changing the way you eat and drink, such as: Eating more foods that contain vitamin B12. Drinking little or no alcohol. Getting vitamin B12 shots. Taking vitamin B12 supplements by mouth (orally). Your doctor will tell you the dose that is best for you. Follow  these instructions at home: Eating and drinking  Eat foods that come from animals and have a lot of vitamin B12 in them. These include: Meats and poultry. This includes beef, pork, chicken, turkey, and organ meats, such as liver. Seafood, such as clams, rainbow trout, salmon, tuna, and haddock. Eggs. Dairy foods such as milk, yogurt, and cheese. Eat breakfast cereals that have vitamin B12 added to them (are fortified). Check the label. The items listed above may not be a complete list of foods and beverages you can eat and drink. Contact a dietitian for more information. Alcohol use Do not drink alcohol if: Your doctor tells you not to drink. You are pregnant, may be pregnant, or are planning to become pregnant. If you drink alcohol: Limit how much you have to: 0-1 drink a day for women. 0-2 drinks a day for men. Know how much alcohol is in your drink. In the [place] one drink equals one 12 oz bottle of beer (355 mL), one 5 oz glass of wine (148 mL), or one 1 oz glass of hard liquor (44 mL). General instructions Get any vitamin B12 shots if told by your doctor. Take supplements only as told by your doctor. Follow the directions. Keep all follow-up visits. Contact a doctor if: Your symptoms come back. Your symptoms get worse or do not get better with treatment. Get help right away if: You have trouble breathing. You have a very fast heartbeat. You have chest pain. You get dizzy. You faint. These symptoms may be an emergency. Get help right away. Call 911.   Do not wait to see if the symptoms will go away. Do not drive yourself to the hospital. Summary Vitamin B12 deficiency means that your body is not getting enough of the vitamin. In some cases, there are no symptoms of this condition. Treatment may include making a change in the way you eat and drink, getting shots, or taking supplements. Eat foods that have vitamin B12 in them. This information is not intended to replace advice  given to you by your health care provider. Make sure you discuss any questions you have with your health care provider. Document Revised: 01/28/2021 Document Reviewed: 01/28/2021 Elsevier Patient Education  2022 Elsevier Inc.  

## 2021-08-29 ENCOUNTER — Encounter: Payer: Self-pay | Admitting: Internal Medicine

## 2021-08-29 ENCOUNTER — Other Ambulatory Visit: Payer: Self-pay

## 2021-08-29 MED ORDER — FLUTICASONE PROPIONATE 50 MCG/ACT NA SUSP: 2.0000 | Freq: Every day | NASAL | 5 refills | Status: DC | PRN

## 2021-09-28 ENCOUNTER — Encounter: Payer: Self-pay | Admitting: Student

## 2021-09-28 ENCOUNTER — Ambulatory Visit: Payer: Medicare Other | Admitting: Internal Medicine

## 2021-09-28 ENCOUNTER — Ambulatory Visit (INDEPENDENT_AMBULATORY_CARE_PROVIDER_SITE_OTHER): Payer: Medicare Other | Admitting: Student

## 2021-09-28 VITALS — BP 124/79 | HR 90 | Ht 65.0 in | Wt 150.0 lb

## 2021-09-28 DIAGNOSIS — E785 Hyperlipidemia, unspecified: Secondary | ICD-10-CM

## 2021-09-28 DIAGNOSIS — I10 Essential (primary) hypertension: Secondary | ICD-10-CM | POA: Diagnosis not present

## 2021-09-28 DIAGNOSIS — R072 Precordial pain: Secondary | ICD-10-CM | POA: Diagnosis not present

## 2021-09-28 DIAGNOSIS — R002 Palpitations: Secondary | ICD-10-CM | POA: Diagnosis not present

## 2021-09-28 NOTE — Patient Instructions (Signed)
Medication Instructions:  ?Your physician recommends that you continue on your current medications as directed. Please refer to the Current Medication list given to you today. ? ?*If you need a refill on your cardiac medications before your next appointment, please call your pharmacy* ? ? ?Lab Work: ?NONE  ? ?If you have labs (blood work) drawn today and your tests are completely normal, you will receive your results only by: ?MyChart Message (if you have MyChart) OR ?A paper copy in the mail ?If you have any lab test that is abnormal or we need to change your treatment, we will call you to review the results. ? ? ?Testing/Procedures: ?NONE  ? ? ?Follow-Up: ?At [facility], you and your health needs are our priority.  As part of our continuing mission to provide you with exceptional heart care, we have created designated Provider Care Teams.  These Care Teams include your primary Cardiologist (physician) and Advanced Practice Providers (APPs -  Physician Assistants and Nurse Practitioners) who all work together to provide you with the care you need, when you need it. ? ?We recommend signing up for the patient portal called "MyChart".  Sign up information is provided on this After Visit Summary.  MyChart is used to connect with patients for Virtual Visits (Telemedicine).  Patients are able to view lab/test results, encounter notes, upcoming appointments, etc.  Non-urgent messages can be sent to your provider as well.   ?To learn more about what you can do with MyChart, go to [email or web address].   ? ?Your next appointment:   ?6 month(s) ? ?The format for your next appointment:   ?In Person ? ?Provider:   ?You may see Werner Lean, MD or one of the following Advanced Practice Providers on your designated Care Team:   ?Bernerd Pho, PA-C  ?Ermalinda Barrios, PA-C   ? ? ?Other Instructions ?Thank you for choosing [facility]! ? ? ? ?Important Information About Sugar ? ? ? ? ? ? ?

## 2021-09-28 NOTE — Progress Notes (Signed)
? ?Virtual Visit via Telephone Note  ? ?This visit type was conducted due to national recommendations for restrictions regarding the COVID-19 Pandemic (e.g. social distancing) in an effort to limit this patient's exposure and mitigate transmission in our community.  Due to her co-morbid illnesses, this patient is at least at moderate risk for complications without adequate follow up.  This format is felt to be most appropriate for this patient at this time.  The patient did not have access to video technology/had technical difficulties with video requiring transitioning to audio format only (telephone).  All issues noted in this document were discussed and addressed.  No physical exam could be performed with this format.  Please refer to the patient's chart for her  consent to telehealth for [facility].  ? ? ?Date:  09/29/2021  ? ?ID:  Sierra Glover, DOB Jun 17, 1956, MRN [id number] ?The patient was identified using 2 identifiers. ? ?Patient Location: Home ?Provider Location: Home Office ? ? ?PCP:  Glendale Chard, MD ?  ?[facility] ?Cardiologist:  Werner Lean, MD    ? ?Evaluation Performed:  Follow-Up Visit ? ?Chief Complaint: Review testing ? ?History of Present Illness:   ? ?[name] is a 66 y.o. female with past medical history of HTN and prior DVT who presents for a 21-month follow-up telehealth visit. ? ?She was examined by Dr. Gasper Sells in 06/2021 as a new patient referral for palpitations. She reported having experienced intermittent chest pain and variable BP since prior COVID-19 diagnosis. Reported that her chest pain would improve with deep breathing and she had been experiencing intermittent palpitations. Given her cardiac risk factors, a Lexiscan Myoview was recommended for ischemic evaluation along with a 14-day Zio patch. Her nuclear stress test showed no evidence of ischemia or infarction and EF was normal at 73%. It was overall a low risk study. Her event  monitor showed predominantly normal sinus rhythm with rare PAC's and PVC's with a less than 1% burden. ? ?In talking with the patient today, she reports her palpitations have decreased in frequency and severity since her last visit. Previously having tachy-palpitations but she feels like her symptoms were not severe while the monitor was in place. Also reports her episodes of chest pain have improved and she is not having to take as frequent of deep breaths. Does report occasional dizziness in the morning hours but does not feel like symptoms worsened following dose titration of Amlodipine and they do not occur daily. This has been occurring since her prior COVID-19 diagnosis. No recent orthopnea, PND or pitting edema.  ? ? ?The patient does not have symptoms concerning for COVID-19 infection (fever, chills, cough, or new shortness of breath).  ? ? ?Past Medical History:  ?Diagnosis Date  ? Anemia   ? before my hysterectomy  ? Dizziness   ? DVT (deep venous thrombosis) ([place])   ? "Many, many years ago."  ? HTN (hypertension)   ? Nausea   ? Palpitation   ? Serum calcium elevated   ? ?Past Surgical History:  ?Procedure Laterality Date  ? COLONOSCOPY    ? partal hysterectomy  2009  ? TIBIA IM NAIL INSERTION Right 01/07/2021  ? Procedure: INTRAMEDULLARY (IM) NAIL TIBIAL;  Surgeon: Shona Needles, MD;  Location: [facility];  Service: Orthopedics;  Laterality: Right;  ?  ? ?Current Meds  ?Medication Sig  ? amLODipine (NORVASC) 10 MG tablet Take 1 tablet (10 mg total) by mouth daily.  ? atorvastatin (LIPITOR) 20 MG  tablet Take 1 tablet (20 mg total) by mouth daily.  ? ciclopirox (PENLAC) 8 % solution Apply topically at bedtime. Apply over nail and surrounding skin. Apply daily over previous coat. After seven (7) days, may remove with alcohol and continue cycle.  ? fluticasone (FLONASE) 50 MCG/ACT nasal spray Place 2 sprays into both nostrils daily as needed for allergies or rhinitis.  ? loratadine (CLARITIN) 10 MG tablet Take 1  tablet (10 mg total) by mouth daily.  ? meclizine (ANTIVERT) 25 MG tablet Take 1 tablet (25 mg total) by mouth 2 (two) times daily as needed for dizziness.  ? ondansetron (ZOFRAN ODT) 8 MG disintegrating tablet Take 1 tablet (8 mg total) by mouth every 8 (eight) hours as needed for nausea or vomiting.  ? valACYclovir (VALTREX) 500 MG tablet take 1 tablet by oral route  every day (Patient taking differently: as needed. take 1 tablet by oral route  every day)  ? valsartan-hydrochlorothiazide (DIOVAN-HCT) 160-12.5 MG tablet TAKE 1 TABLET BY MOUTH EVERY DAY  ?  ? ?Allergies:   Patient has no known allergies.  ? ?Social History  ? ?Tobacco Use  ? Smoking status: Never  ? Smokeless tobacco: Never  ? Tobacco comments:  ?  N/a  ?Vaping Use  ? Vaping Use: Never used  ?Substance Use Topics  ? Alcohol use: Not Currently  ?  Comment: social  ? Drug use: Never  ?  ? ?Family Hx: ?The patient's family history includes Alzheimer's disease in her mother; COPD in her father; Hypertension in her father, mother, and another family member. ? ?ROS:   ?Please see the history of present illness.    ? ?All other systems reviewed and are negative. ? ? ?Prior CV studies:   ?The following studies were reviewed today: ? ?NST: 06/2021 ?  The study is normal. The study is low risk. ?  No ST deviation was noted. ?  Left ventricular function is normal. Nuclear stress EF: 73 %. The left ventricular ejection fraction is hyperdynamic (>65%). End diastolic cavity size is normal. End systolic cavity size is normal. ?  Prior study not available for comparison. ?  ?Normal stress nuclear study with no ischemia or infarction.  Gated ejection fraction 73% with normal wall motion. ? ?Event Monitor: 07/2021 ?Patient had a minimum heart rate of 47 bpm, maximum heart rate of 165 bpm, and average heart rate of 74 bpm. ?Predominant underlying rhythm was sinus rhythm. ?Isolated PACs were rare (<1.0%). ?Isolated PVCs were rare (<1.0%). ?No triggered events. ?  ?No  malignant arrhythmias. ? ?Labs/Other Tests and Data Reviewed:   ? ?EKG:  No ECG reviewed. ? ?Recent Labs: ?05/19/2021: TSH 1.470 ?05/22/2021: Hemoglobin 13.0; Platelets 318 ?06/07/2021: ALT 16; BUN 8; Creatinine, Ser 0.85; Magnesium 2.1; Potassium 3.6; Sodium 145  ? ?Recent Lipid Panel ?Lab Results  ?Component Value Date/Time  ? CHOL 147 06/07/2021 12:34 PM  ? TRIG 92 06/07/2021 12:34 PM  ? HDL 60 06/07/2021 12:34 PM  ? CHOLHDL 2.5 06/07/2021 12:34 PM  ? [place] 70 06/07/2021 12:34 PM  ? ? ?Wt Readings from Last 3 Encounters:  ?09/28/21 150 lb (68 kg)  ?08/16/21 149 lb (67.6 kg)  ?07/14/21 142 lb (64.4 kg)  ?  ? ?Objective:   ? ?Vital Signs:  BP 124/79   Pulse 90   Ht 5\' 5"  (1.651 m)   Wt 150 lb (68 kg)   BMI 24.96 kg/m?   ? ? ?General: Pleasant female sounding in NAD ?Psych: Normal affect. ?Neuro:  Alert and oriented X 3.  ?Lungs:  Resp regular and unlabored while talking on the phone.  ? ?ASSESSMENT & PLAN:   ? ?1. Palpitations ?- Her recent monitor was reassuring and showed predominately NSR with rare PAC's and PVC's but less than a 1% burden. Given gradual improvement in her symptoms, would not plan for the use of AV nodal blocking agents at this time.  ? ?2. Chest Pain ?- Recent Lexiscan Myoview showed no evidence of ischemia or infarction. She reports her symptoms continue to improve and I recommended she try to gradually increase her activity and build up her exercise tolerance. Continue with risk factor modification.  ? ?3. HTN ?- Her BP was well-controlled at 124/79 on most recent check. Continue current medication regimen with Amlodipine 10mg  daily and Valsartan-HCTZ 160-12.5mg  daily.  ? ?4. HLD ?- FLP in 05/2021 showed total cholesterol of 147, triglycerides 92, HDL 60 and LDL 70. Continue Atorvastatin 20mg  daily.  ? ? ? ?Time:   ?Today, I have spent 32 minutes with the patient with telehealth technology discussing the above problems.   ? ? ?Medication Adjustments/Labs and Tests Ordered: ?Current  medicines are reviewed at length with the patient today.  Concerns regarding medicines are outlined above.  ? ?Tests Ordered: ?No orders of the defined types were placed in this encounter. ? ? ?Medication Changes: ?N

## 2021-10-13 ENCOUNTER — Encounter: Payer: Self-pay | Admitting: Internal Medicine

## 2021-10-13 ENCOUNTER — Other Ambulatory Visit: Payer: Self-pay

## 2021-10-13 DIAGNOSIS — R42 Dizziness and giddiness: Secondary | ICD-10-CM

## 2021-10-13 MED ORDER — MECLIZINE HCL 25 MG PO TABS: 25.0000 mg | ORAL_TABLET | Freq: Two times a day (BID) | ORAL | 0 refills | Status: DC | PRN

## 2021-11-23 ENCOUNTER — Other Ambulatory Visit: Payer: Self-pay

## 2021-11-23 ENCOUNTER — Emergency Department (HOSPITAL_COMMUNITY)
Admission: EM | Admit: 2021-11-23 | Discharge: 2021-11-23 | Disposition: A | Discharge status: Home / Self Care | Payer: Medicare Other | Attending: Emergency Medicine | Admitting: Emergency Medicine

## 2021-11-23 ENCOUNTER — Encounter (HOSPITAL_COMMUNITY): Payer: Self-pay | Admitting: Emergency Medicine

## 2021-11-23 ENCOUNTER — Ambulatory Visit
Admission: EM | Admit: 2021-11-23 | Discharge: 2021-11-23 | Disposition: A | Discharge status: Home / Self Care | Payer: Medicare Other | Attending: Nurse Practitioner | Admitting: Nurse Practitioner

## 2021-11-23 ENCOUNTER — Encounter: Payer: Self-pay | Admitting: Emergency Medicine

## 2021-11-23 DIAGNOSIS — E876 Hypokalemia: Secondary | ICD-10-CM | POA: Insufficient documentation

## 2021-11-23 DIAGNOSIS — R42 Dizziness and giddiness: Secondary | ICD-10-CM | POA: Diagnosis present

## 2021-11-23 DIAGNOSIS — Z8616 Personal history of COVID-19: Secondary | ICD-10-CM | POA: Diagnosis not present

## 2021-11-23 DIAGNOSIS — R55 Syncope and collapse: Secondary | ICD-10-CM | POA: Insufficient documentation

## 2021-11-23 LAB — BASIC METABOLIC PANEL
Anion gap: 9 (ref 5–15)
BUN: 11 mg/dL (ref 8–23)
CO2: 28 mmol/L (ref 22–32)
Calcium: 9.4 mg/dL (ref 8.9–10.3)
Chloride: 102 mmol/L (ref 98–111)
Creatinine, Ser: 0.73 mg/dL (ref 0.44–1.00)
GFR, Estimated: >60 mL/min (ref >60)
Glucose, Bld: 104 mg/dL — ABNORMAL HIGH (ref 70–99)
Potassium: 2.8 mmol/L — ABNORMAL LOW (ref 3.5–5.1)
Sodium: 139 mmol/L (ref 135–145)

## 2021-11-23 LAB — URINALYSIS, ROUTINE W REFLEX MICROSCOPIC
Bilirubin Urine: NEGATIVE
Glucose, UA: NEGATIVE mg/dL
Hgb urine dipstick: NEGATIVE
Ketones, ur: NEGATIVE mg/dL
Leukocytes,Ua: NEGATIVE
Nitrite: NEGATIVE
Protein, ur: NEGATIVE mg/dL
Specific Gravity, Urine: 1.006 (ref 1.005–1.030)
pH: 7 (ref 5.0–8.0)

## 2021-11-23 LAB — MAGNESIUM: Magnesium: 2.1 mg/dL (ref 1.7–2.4)

## 2021-11-23 LAB — CBC
HCT: 37.5 % (ref 36.0–46.0)
Hemoglobin: 12.3 g/dL (ref 12.0–15.0)
MCH: 28.5 pg (ref 26.0–34.0)
MCHC: 32.8 g/dL (ref 30.0–36.0)
MCV: 87 fL (ref 80.0–100.0)
Platelets: 324 10*3/uL (ref 150–400)
RBC: 4.31 MIL/uL (ref 3.87–5.11)
RDW: 12.7 % (ref 11.5–15.5)
WBC: 7.4 10*3/uL (ref 4.0–10.5)
nRBC: 0 % (ref 0.0–0.2)

## 2021-11-23 LAB — CBG MONITORING, ED: Glucose-Capillary: 94 mg/dL (ref 70–99)

## 2021-11-23 MED ORDER — POTASSIUM CHLORIDE CRYS ER 20 MEQ PO TBCR
40.0000 meq | EXTENDED_RELEASE_TABLET | Freq: Once | ORAL | Status: AC
  Administered 2021-11-23: 40 meq via ORAL
  Filled 2021-11-23: qty 2

## 2021-11-23 MED ORDER — POTASSIUM CHLORIDE CRYS ER 20 MEQ PO TBCR: EXTENDED_RELEASE_TABLET | ORAL | 0 refills | Status: DC

## 2021-11-23 NOTE — ED Triage Notes (Signed)
Pt had near syncopal episode today at work while sitting at desk, check out at [facility] and everything checked fine, sent here for further evaluation.

## 2021-11-23 NOTE — ED Provider Notes (Signed)
[facility] EMERGENCY DEPARTMENT Provider Note   CSN: [id number] Arrival date & time: 11/23/21  1444     History  Chief Complaint  Patient presents with   Near Syncope    Sierra Glover is a 66 y.o. female.  HPI Patient presents for evaluation of an episode of lightheadedness which occurred earlier today.  She felt like she might pass out, had some darkening of her vision and felt hot.  She sat down and the symptoms improved.  She was able to walk and did not have any change in her balance.  She has had intermittent lightheadedness, for about a year since she was diagnosed with long COVID syndrome.  She went to an urgent care today who directed her to come here for further evaluation.  She took her usual morning medicines which included blood pressure and cholesterol medications.  Patient has a sensation of not feeling completely normal at this time.    Home Medications Prior to Admission medications   Medication Sig Start Date End Date Taking? Authorizing Provider  amLODipine (NORVASC) 10 MG tablet Take 1 tablet (10 mg total) by mouth daily. 07/28/21  Yes Chandrasekhar, Mahesh A, MD  B Complex Vitamins (VITAMIN B COMPLEX PO) Take by mouth.   Yes [provider]  ciclopirox (PENLAC) 8 % solution Apply topically at bedtime. Apply over nail and surrounding skin. Apply daily over previous coat. After seven (7) days, may remove with alcohol and continue cycle. 11/29/20  Yes Glendale Chard, MD  fluticasone [facility]) 50 MCG/ACT nasal spray Place 2 sprays into both nostrils daily as needed for allergies or rhinitis. 08/29/21  Yes Glendale Chard, MD  loratadine (CLARITIN) 10 MG tablet Take 1 tablet (10 mg total) by mouth daily. 05/19/21 05/19/22 Yes Glendale Chard, MD  meclizine (ANTIVERT) 25 MG tablet Take 1 tablet (25 mg total) by mouth 2 (two) times daily as needed for dizziness. 10/13/21  Yes Glendale Chard, MD  ondansetron (ZOFRAN ODT) 8 MG disintegrating tablet Take 1 tablet (8 mg  total) by mouth every 8 (eight) hours as needed for nausea or vomiting. 01/18/21  Yes Ghumman, Ramandeep, NP  potassium chloride SA (KLOR-CON M) 20 MEQ tablet Take 1 3 times a day for 2 days then twice daily. 11/23/21  Yes Daleen Bo, MD  valACYclovir (VALTREX) 500 MG tablet take 1 tablet by oral route  every day Patient taking differently: as needed. take 1 tablet by oral route  every day 09/20/18  Yes Glendale Chard, MD  Zinc Oxide-Vitamin C (ZINC PLUS VITAMIN C PO) Take by mouth.   Yes [provider]  atorvastatin (LIPITOR) 20 MG tablet TAKE 1 TABLET BY MOUTH EVERY DAY 11/24/21   Glendale Chard, MD  valsartan-hydrochlorothiazide (DIOVAN-HCT) 160-12.5 MG tablet TAKE 1 TABLET BY MOUTH EVERY DAY 11/24/21   Glendale Chard, MD      Allergies    Patient has no known allergies.    Review of Systems   Review of Systems  Physical Exam Updated Vital Signs BP (!) 150/84   Pulse 83   Temp 98.2 F (36.8 C)   Resp 14   Ht 5\' 5"  (1.651 m)   Wt 68.9 kg   SpO2 99%   BMI 25.29 kg/m  Physical Exam Vitals and nursing note reviewed.  Constitutional:      General: She is in acute distress (She appears worried).     Appearance: She is well-developed. She is not ill-appearing or diaphoretic.  HENT:     Head: Normocephalic  and atraumatic.     Right Ear: External ear normal.     Left Ear: External ear normal.  Eyes:     Conjunctiva/sclera: Conjunctivae normal.     Pupils: Pupils are equal, round, and reactive to light.  Neck:     Trachea: Phonation normal.  Cardiovascular:     Rate and Rhythm: Normal rate and regular rhythm.     Heart sounds: Normal heart sounds.  Pulmonary:     Effort: Pulmonary effort is normal.     Breath sounds: Normal breath sounds.  Abdominal:     Palpations: Abdomen is soft.     Tenderness: There is no abdominal tenderness.  Musculoskeletal:        General: Normal range of motion.     Cervical back: Normal range of motion and neck supple.  Skin:    General:  Skin is warm and dry.  Neurological:     Mental Status: She is alert and oriented to person, place, and time.     Cranial Nerves: No cranial nerve deficit.     Sensory: No sensory deficit.     Motor: No abnormal muscle tone.     Coordination: Coordination normal.     Comments: No dysarthria, aphasia or nystagmus.  No ataxia.  Psychiatric:        Mood and Affect: Mood normal.        Behavior: Behavior normal.        Thought Content: Thought content normal.        Judgment: Judgment normal.     ED Results / Procedures / Treatments   Labs (all labs ordered are listed, but only abnormal results are displayed) Labs Reviewed  BASIC METABOLIC PANEL - Abnormal; Notable for the following components:      Result Value   Potassium 2.8 (*)    Glucose, Bld 104 (*)    All other components within normal limits  URINALYSIS, ROUTINE W REFLEX MICROSCOPIC - Abnormal; Notable for the following components:   Color, Urine STRAW (*)    All other components within normal limits  CBC  MAGNESIUM  CBG MONITORING, ED    EKG EKG Interpretation  Date/Time:  Wednesday November 23 2021 15:05:03 EDT Ventricular Rate:  83 PR Interval:  154 QRS Duration: 92 QT Interval:  369 QTC Calculation: 434 R Axis:   -39 Text Interpretation: Sinus rhythm Left axis deviation Borderline T abnormalities, inferior leads since last tracing no significant change Confirmed by Daleen Bo [phone]) on 11/23/2021 3:12:05 PM  Radiology No results found.  Procedures Procedures    Medications Ordered in ED Medications  potassium chloride SA (KLOR-CON M) CR tablet 40 mEq (40 mEq Oral Given 11/23/21 1751)    ED Course/ Medical Decision Making/ A&P                           Medical Decision Making Patient with ongoing lightheadedness and long COVID syndrome presenting with a brief period of lightheadedness with near syncopal symptoms.  These improved with sitting down.  She went to an urgent care and was directed here for  further evaluation.  She denies recent fever, chills, cough, shortness of breath.  She has had some nausea without vomiting.  She denies changes in bowel or urine habits or symptoms.  She states she she anuria at this time.  Problems Addressed: Hypokalemia: acute illness or injury    Details: Incidental finding, possibly contributing to her discomfort and near syncope.  Very low suspicion for cardiac arrhythmia. Near syncope: acute illness or injury    Details: Cause, patient being monitored for long COVID syndrome  Amount and/or Complexity of Data Reviewed Independent Historian:     Details: She is a cogent historian External Data Reviewed: notes.    Details: Notes from Ms. Edsel Petrin, urgent care visit today: 1. Syncope, unspecified syncope type We discussed etiologies of syncope and presyncope to include cardiac arrhythmia, carotid artery stenosis, intracranial process along with electrolytes disturbances or anemia.  Her EKG is reassuring and orthostatic vital signs are normal today.  We discussed that I am unable to evaluate for an intracranial process today and I recommend she go to the Emergency Room for further work up and evaluation. Labs: ordered.    Details: CBC, metabolic panel, magnesium analysis-normal except potassium low, glucose high ECG/medicine tests: ordered and independent interpretation performed.    Details: Cardiac monitor-normal sinus rhythm  Risk Prescription drug management. Risk Details: Patient presenting with near syncope.  She is followed as an outpatient for long COVID syndrome.  She does not have a cardiac history of concern.  EKG today is reassuring.  Cardiac monitor does not indicate cardiac arrhythmia.  Incidental finding for high kalemia possibly contributing to weakness that could have led to near syncope.  No evidence for acute renal failure.  Doubt dehydration or volume depletion.  Patient is stable for discharge with outpatient management.  She was started  on potassium in the ED and will be given a prescription for same.  Recommend PCP follow-up in 1 week for repeat potassium testing and further evaluation as needed.  She does not require hospitalization at this time.           Final Clinical Impression(s) / ED Diagnoses Final diagnoses:  Near syncope  Hypokalemia    Rx / DC Orders ED Discharge Orders          Ordered    potassium chloride SA (KLOR-CON M) 20 MEQ tablet        11/23/21 1938              Daleen Bo, MD 11/24/21 1244

## 2021-11-23 NOTE — ED Notes (Signed)
Patient is being discharged from the Urgent Care and sent to the Emergency Department via private vehicle . Per PA, patient is in need of higher level of care due to syncope. Patient is aware and verbalizes understanding of plan of care.  Vitals:   11/23/21 1328  BP: (!) 147/77  Pulse: 90  Resp: 18  Temp: 98.2 F (36.8 C)  SpO2: 98%

## 2021-11-23 NOTE — ED Provider Notes (Signed)
[facility]    CSN: [id number] Arrival date & time: 11/23/21  1321      History   Chief Complaint No chief complaint on file.   HPI Sierra Glover is a 66 y.o. female.   Patient presents for presyncope that occurred earlier this morning.  She describes as feeling "faint" and reports the "room got dark" while she was sitting at her desk working from home.  Reports she felt like she was going to faint.  She says this lasted for about 45 minutes.  Immediately when it happened, she called her granddaughter, and laid down on her bed.   She reports that she got hot, but did not sweat, denies any recent fall, accident, or head injury.  She feels like her gait was steady and she did not get pale.  She denies any room spinning sensation, dizziness, lightheadedness, syncope worse with head movement, coughing, or loud noises.  She denies any chest pain, shortness of breath, vision changes, ringing in her ears, nausea or vomiting, trouble with her words, or altered mental status when it happened.  She reports she did have a headache earlier today.  She reports she ate breakfast this morning-was a boiled egg with some sausage and blueberries.  Patient reports medical history significant for hypertension, previous history of a DVT, and palpitations.  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  Also reports her father had a stroke when he was older.  Currently, she still feels "blah and woozy."  She denies any current chest pain, shortness of breath, room spinning sensation, vision changes, or current presyncope.   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      Home Medications    Prior to Admission medications   Medication Sig Start Date End Date Taking? Authorizing Provider  amLODipine (NORVASC) 10 MG tablet Take 1 tablet (10 mg total) by mouth daily. 07/28/21   Chandrasekhar, [place] DragonMahesh A, MD  atorvastatin (LIPITOR) 20 MG tablet Take 1 tablet (20 mg total) by mouth daily. 12/01/20 12/01/21  Dorothyann PengSanders, Robyn, MD  ciclopirox [facility](PENLAC) 8 % solution Apply topically at bedtime. Apply over nail and surrounding skin. Apply daily over previous coat. After seven (7) days, may remove with alcohol and continue cycle. 11/29/20   Dorothyann PengSanders, Robyn, MD  fluticasone [facility](FLONASE) 50 MCG/ACT nasal  spray Place 2 sprays into both nostrils daily as needed for allergies or rhinitis. 08/29/21   Dorothyann Peng, MD  loratadine (CLARITIN) 10 MG tablet Take 1 tablet (10 mg total) by mouth daily. 05/19/21 05/19/22  Dorothyann Peng, MD  meclizine (ANTIVERT) 25 MG tablet Take 1 tablet (25 mg total) by mouth 2 (two) times daily as needed for dizziness. 10/13/21   Dorothyann Peng, MD  ondansetron (ZOFRAN ODT) 8 MG disintegrating tablet Take 1 tablet (8 mg total) by mouth every 8 (eight) hours as needed for nausea or vomiting. 01/18/21   Charlesetta Ivory, NP   valACYclovir (VALTREX) 500 MG tablet take 1 tablet by oral route  every day Patient taking differently: as needed. take 1 tablet by oral route  every day 09/20/18   Dorothyann Peng, MD  valsartan-hydrochlorothiazide (DIOVAN-HCT) 160-12.5 MG tablet TAKE 1 TABLET BY MOUTH EVERY DAY 06/23/21   Dorothyann Peng, MD    Family History Family History  Problem Relation Age of Onset   Alzheimer's disease Mother    Hypertension Mother    Hypertension Father    COPD Father    Hypertension Other     Social History Social History   Tobacco Use   Smoking status: Never   Smokeless tobacco: Never   Tobacco comments:    N/a  Vaping Use   Vaping Use: Never used  Substance Use Topics   Alcohol use: Not Currently    Comment: social   Drug use: Never     Allergies   Patient has no known allergies.   Review of Systems Review of Systems Per HPI  Physical Exam Triage Vital Signs ED Triage Vitals [11/23/21 1328]  Enc Vitals Group     BP (!) 147/77     Pulse Rate 90     Resp 18     Temp 98.2 F (36.8 C)     Temp Source Oral     SpO2 98 %     Weight      Height      Head Circumference      Peak Flow      Pain Score 0     Pain Loc      Pain Edu?      Excl. in GC?    Orthostatic VS for the past 24 hrs:  BP- Lying Pulse- Lying BP- Sitting Pulse- Sitting BP- Standing at 0 minutes Pulse- Standing at 0 minutes  11/23/21 1337 146/79 73 149/77 77 148/85 84    Updated Vital Signs BP (!) 147/77 (BP Location: Right Arm)   Pulse 90   Temp 98.2 F (36.8 C) (Oral)   Resp 18   SpO2 98%   Visual Acuity Right Eye Distance:   Left Eye Distance:   Bilateral Distance:    Right Eye Near:   Left Eye Near:    Bilateral Near:     Physical Exam Vitals and nursing note reviewed.  Constitutional:      General: She is not in acute distress.    Appearance: Normal appearance. She is not ill-appearing, toxic-appearing or diaphoretic.  HENT:     Head: Normocephalic and atraumatic.      Mouth/Throat:     Mouth: Mucous membranes are moist.     Pharynx: Oropharynx is clear.  Eyes:     General: No visual field deficit or scleral icterus.       Right eye: No discharge.        Left eye: No discharge.  Extraocular Movements: Extraocular movements intact.     Pupils: Pupils are equal, round, and reactive to light.  Neck:     Vascular: No carotid bruit.  Cardiovascular:     Rate and Rhythm: Normal rate and regular rhythm.  Pulmonary:     Effort: Pulmonary effort is normal. No respiratory distress.     Breath sounds: Normal breath sounds. No wheezing, rhonchi or rales.  Skin:    General: Skin is warm and dry.     Capillary Refill: Capillary refill takes less than 2 seconds.     Coloration: Skin is not jaundiced or pale.     Findings: No erythema.  Neurological:     Mental Status: She is alert and oriented to person, place, and time.     Cranial Nerves: Cranial nerves 2-12 are intact. No dysarthria or facial asymmetry.     Sensory: Sensation is intact.     Motor: Motor function is intact. No weakness.     Coordination: Coordination is intact. Romberg sign negative. Coordination normal. Rapid alternating movements normal.     Gait: Gait normal.  Psychiatric:        Speech: Speech normal.        Behavior: Behavior is cooperative.     UC Treatments / Results  Labs (all labs ordered are listed, but only abnormal results are displayed) Labs Reviewed - No data to display  EKG   Radiology No results found.  Procedures Procedures (including critical care time)  Medications Ordered in UC Medications - No data to display  Initial Impression / Assessment and Plan / UC Course  I have reviewed the triage vital signs and the nursing notes.  Pertinent labs & imaging results that were available during my care of the patient were reviewed by me and considered in my medical decision making (see chart for details).    1. Syncope, unspecified syncope type We discussed  etiologies of syncope and presyncope to include cardiac arrhythmia, carotid artery stenosis, intracranial process along with electrolytes disturbances or anemia.  Her EKG is reassuring and orthostatic vital signs are normal today.  We discussed that I am unable to evaluate for an intracranial process today and I recommend she go to the Emergency Room for further work up and evaluation.  The patient was given the opportunity to ask questions.  All questions answered to their satisfaction.  The patient is in agreement to this plan.   Final Clinical Impressions(s) / UC Diagnoses   Final diagnoses:  Syncope, unspecified syncope type     Discharge Instructions      Please go directly to the emergency room for further evaluation and work-up of the symptoms you described today     ED Prescriptions   None    PDMP not reviewed this encounter.   Valentino Nose, NP 11/23/21 1430

## 2021-11-23 NOTE — Discharge Instructions (Signed)
Please go directly to the emergency room for further evaluation and work-up of the symptoms you described today

## 2021-11-23 NOTE — ED Notes (Signed)
Pt ambulated to restroom at this time without incident. Sierra Glover 

## 2021-11-23 NOTE — ED Triage Notes (Signed)
Felt like she was going to pass out while sitting today.  States she had a headache earlier today.

## 2021-11-23 NOTE — Discharge Instructions (Signed)
Try to eat foods which contain potassium.  We sent a prescription to your pharmacy to start taking tomorrow morning.  Make sure you are drinking plenty of fluids.  This can help avoid a sensation of dizziness and passing out.  Return here, if needed for problems

## 2021-11-24 ENCOUNTER — Other Ambulatory Visit: Payer: Self-pay | Admitting: Internal Medicine

## 2021-11-24 ENCOUNTER — Telehealth (INDEPENDENT_AMBULATORY_CARE_PROVIDER_SITE_OTHER): Payer: Medicare Other | Admitting: Internal Medicine

## 2021-11-24 ENCOUNTER — Encounter: Payer: Self-pay | Admitting: Internal Medicine

## 2021-11-24 ENCOUNTER — Telehealth: Payer: Self-pay

## 2021-11-24 VITALS — BP 120/81 | Ht 65.0 in | Wt 151.0 lb

## 2021-11-24 DIAGNOSIS — E876 Hypokalemia: Secondary | ICD-10-CM | POA: Diagnosis not present

## 2021-11-24 DIAGNOSIS — R55 Syncope and collapse: Secondary | ICD-10-CM

## 2021-11-24 DIAGNOSIS — I10 Essential (primary) hypertension: Secondary | ICD-10-CM

## 2021-11-24 NOTE — Progress Notes (Signed)
Virtual Visit via Video   This visit type was conducted due to national recommendations for restrictions regarding the COVID-19 Pandemic (e.g. social distancing) in an effort to limit this patient's exposure and mitigate transmission in our community.  Due to her co-morbid illnesses, this patient is at least at moderate risk for complications without adequate follow up.  This format is felt to be most appropriate for this patient at this time.  All issues noted in this document were discussed and addressed.  A limited physical exam was performed with this format.    This visit type was conducted due to national recommendations for restrictions regarding the COVID-19 Pandemic (e.g. social distancing) in an effort to limit this patient's exposure and mitigate transmission in our community.  Patients identity confirmed using two different identifiers.  This format is felt to be most appropriate for this patient at this time.  All issues noted in this document were discussed and addressed.  No physical exam was performed (except for noted visual exam findings with Video Visits).    Date:  12/28/2021   ID:  Sierra Glover, DOB 04-Apr-1956, MRN [id number]  Patient Location:  Home  Provider location:   Office    Chief Complaint:  "I have ER f/u"  History of Present Illness:    Sierra Glover is a 66 y.o. female who presents via video conferencing for a telehealth visit today.    The patient does not have symptoms concerning for COVID-19 infection (fever, chills, cough, or new shortness of breath).   She presents today for virtual visit. She prefers this method of contact due to COVID-19 pandemic.  She presents today for ER f/u. She presented to [facility] ER on 6//23 for further evaluation of an episode of lightheadedness which occurred earlier today.  She felt like she might pass out, had some darkening of her vision and felt hot.  She sat down and the symptoms improved.  She was able to  walk and did not have any change in her balance.  She has had intermittent lightheadedness, for about a year since she was diagnosed with COVID.  She went to an urgent care initially for evaluation, and they directed her to ER for further evaluation.  She took her usual morning medicines which included blood pressure and cholesterol medications.  Patient has a sensation of not feeling completely normal at this time.  Cardiac workup was negative.      Past Medical History:  Diagnosis Date   Anemia    before my hysterectomy   Dizziness    DVT (deep venous thrombosis) (HCC)    "Many, many years ago."   HTN (hypertension)    Nausea    Palpitation    Serum calcium elevated    Past Surgical History:  Procedure Laterality Date   COLONOSCOPY     partal hysterectomy  2009   TIBIA IM NAIL INSERTION Right 01/07/2021   Procedure: INTRAMEDULLARY (IM) NAIL TIBIAL;  Surgeon: Shona Needles, MD;  Location: [facility];  Service: Orthopedics;  Laterality: Right;     Current Meds  Medication Sig   amLODipine (NORVASC) 10 MG tablet Take 1 tablet (10 mg total) by mouth daily.   atorvastatin (LIPITOR) 20 MG tablet TAKE 1 TABLET BY MOUTH EVERY DAY   B Complex Vitamins (VITAMIN B COMPLEX PO) Take by mouth.   ciclopirox (PENLAC) 8 % solution Apply topically at bedtime. Apply over nail and surrounding skin. Apply daily over previous coat. After seven (  7) days, may remove with alcohol and continue cycle.   fluticasone (FLONASE) 50 MCG/ACT nasal spray Place 2 sprays into both nostrils daily as needed for allergies or rhinitis.   loratadine (CLARITIN) 10 MG tablet Take 1 tablet (10 mg total) by mouth daily.   meclizine (ANTIVERT) 25 MG tablet Take 1 tablet (25 mg total) by mouth 2 (two) times daily as needed for dizziness.   ondansetron (ZOFRAN ODT) 8 MG disintegrating tablet Take 1 tablet (8 mg total) by mouth every 8 (eight) hours as needed for nausea or vomiting.   potassium chloride SA (KLOR-CON M) 20 MEQ tablet  Take 1 3 times a day for 2 days then twice daily.   valACYclovir (VALTREX) 500 MG tablet take 1 tablet by oral route  every day (Patient taking differently: as needed. take 1 tablet by oral route  every day)   valsartan-hydrochlorothiazide (DIOVAN-HCT) 160-12.5 MG tablet TAKE 1 TABLET BY MOUTH EVERY DAY   Zinc Oxide-Vitamin C (ZINC PLUS VITAMIN C PO) Take by mouth.     Allergies:   Patient has no known allergies.   Social History   Tobacco Use   Smoking status: Never   Smokeless tobacco: Never   Tobacco comments:    N/a  Vaping Use   Vaping Use: Never used  Substance Use Topics   Alcohol use: Not Currently    Comment: social   Drug use: Never     Family Hx: The patient's family history includes Alzheimer's disease in her mother; COPD in her father; Hypertension in her father, mother, and another family member.  ROS:   Please see the history of present illness.    Review of Systems  Constitutional: Negative.   Respiratory: Negative.    Cardiovascular: Negative.   Gastrointestinal: Negative.   Neurological:  Positive for dizziness.  Psychiatric/Behavioral: Negative.      All other systems reviewed and are negative.   Labs/Other Tests and Data Reviewed:    Recent Labs: 05/19/2021: TSH 1.470 06/07/2021: ALT 16 11/23/2021: Hemoglobin 12.3; Magnesium 2.1; Platelets 324 11/29/2021: BUN 12; Creatinine, Ser 0.90; Potassium 4.3; Sodium 140   Recent Lipid Panel Lab Results  Component Value Date/Time   CHOL 147 06/07/2021 12:34 PM   TRIG 92 06/07/2021 12:34 PM   HDL 60 06/07/2021 12:34 PM   CHOLHDL 2.5 06/07/2021 12:34 PM   LDLCALC 70 06/07/2021 12:34 PM    Wt Readings from Last 3 Encounters:  12/15/21 153 lb 9.6 oz (69.7 kg)  12/06/21 151 lb (68.5 kg)  11/29/21 151 lb (68.5 kg)     Exam:    Vital Signs:  BP 120/81   Ht $R'5\' 5"'oI$  (1.651 m)   Wt 151 lb (68.5 kg)   BMI 25.13 kg/m     Physical Exam Vitals and nursing note reviewed.  Constitutional:      Appearance:  Normal appearance.  HENT:     Head: Normocephalic and atraumatic.     Right Ear: Tympanic membrane, ear canal and external ear normal. There is no impacted cerumen.     Left Ear: Tympanic membrane, ear canal and external ear normal. There is no impacted cerumen.  Eyes:     Extraocular Movements: Extraocular movements intact.  Pulmonary:     Effort: Pulmonary effort is normal.  Musculoskeletal:     Cervical back: Normal range of motion.  Neurological:     Mental Status: She is alert and oriented to person, place, and time.  Psychiatric:  Mood and Affect: Affect normal.     ASSESSMENT & PLAN:    1. Near syncope Comments: ER records reviewed in full detail. If her sx persist, I plan to refer her to Neuro for further evaluation.  - Ambulatory referral to Neurology  2. Hypokalemia Comments: She agrees to come into the office to recheck her potassium level. She will come in next week for lab visit. Also encouraged to increase intake K+ rich foods. - BMP8+eGFR; Future  COVID-19 Education: The signs and symptoms of COVID-19 were discussed with the patient and how to seek care for testing (follow up with PCP or arrange E-visit).  The importance of social distancing was discussed today.  Patient Risk:   After full review of this patients clinical status, I feel that they are at least moderate risk at this time.  Time:   Today, I have spent 19 minutes/ seconds with the patient with telehealth technology discussing above diagnoses.     Medication Adjustments/Labs and Tests Ordered: Current medicines are reviewed at length with the patient today.  Concerns regarding medicines are outlined above.   Tests Ordered: Orders Placed This Encounter  Procedures   BMP8+eGFR   Ambulatory referral to Neurology    Medication Changes: No orders of the defined types were placed in this encounter.   Disposition:  Follow up prn  Signed, [name], MD

## 2021-11-24 NOTE — Patient Instructions (Signed)
Hypokalemia Hypokalemia means that the amount of potassium in the blood is lower than normal. Potassium is a mineral (electrolyte) that helps regulate the amount of fluid in the body. It also stimulates muscle tightening (contraction) and helps nerves work properly. Normally, most of the body's potassium is inside cells, and only a very small amount is in the blood. Because the amount in the blood is so small, minor changes to potassium levels in the blood can be life-threatening. What are the causes? This condition may be caused by: Antibiotic medicine. Diarrhea or vomiting. Taking too much of a medicine that helps you have a bowel movement (laxative) can cause diarrhea and lead to hypokalemia. Chronic kidney disease (CKD). Medicines that help the body get rid of excess fluid (diuretics). Eating disorders, such as anorexia or bulimia. Low magnesium levels in the body. Sweating a lot. What are the signs or symptoms? Symptoms of this condition include: Weakness. Constipation. Fatigue. Muscle cramps. Mental confusion. Skipped heartbeats or irregular heartbeat (palpitations). Tingling or numbness. How is this diagnosed? This condition is diagnosed with a blood test. How is this treated? This condition may be treated by: Taking potassium supplements. Adjusting the medicines that you take. Eating more foods that contain a lot of potassium. If your potassium level is very low, you may need to get potassium through an IV and be monitored in the hospital. Follow these instructions at home: Eating and drinking  Eat a healthy diet. A healthy diet includes fresh fruits and vegetables, whole grains, healthy fats, and lean proteins. If told, eat more foods that contain a lot of potassium. These include: Nuts, such as peanuts and pistachios. Seeds, such as sunflower seeds and pumpkin seeds. Peas, lentils, and lima beans. Whole grain and bran cereals and breads. Fresh fruits and vegetables,  such as apricots, avocado, bananas, cantaloupe, kiwi, oranges, tomatoes, asparagus, and potatoes. Juices, such as orange, tomato, and prune. Lean meats, including fish. Milk and milk products, such as yogurt. General instructions Take over-the-counter and prescription medicines only as told by your health care provider. This includes vitamins, natural food products, and supplements. Keep all follow-up visits. This is important. Contact a health care provider if: You have weakness that gets worse. You feel your heart pounding or racing. You vomit. You have diarrhea. You have diabetes and you have trouble keeping your blood sugar in your target range. Get help right away if: You have chest pain. You have shortness of breath. You have vomiting or diarrhea that lasts for more than 2 days. You faint. These symptoms may be an emergency. Get help right away. Call 911. Do not wait to see if the symptoms will go away. Do not drive yourself to the hospital. Summary Hypokalemia means that the amount of potassium in the blood is lower than normal. This condition is diagnosed with a blood test. Hypokalemia may be treated by taking potassium supplements, adjusting the medicines that you take, or eating more foods that are high in potassium. If your potassium level is very low, you may need to get potassium through an IV and be monitored in the hospital. This information is not intended to replace advice given to you by your health care provider. Make sure you discuss any questions you have with your health care provider. Document Revised: 02/17/2021 Document Reviewed: 02/17/2021 Elsevier Patient Education  2023 [employer]  

## 2021-11-24 NOTE — Telephone Encounter (Signed)
Transition Care Management Follow-up Telephone Call Date of discharge and from where: Sierra Glover 11/23/21 How have you been since you were released from the hospital? She is feeling a little groggy tired with no energy but is starting to feel better. Any questions or concerns? No  Items Reviewed: Did the pt receive and understand the discharge instructions provided? Yes  Medications obtained and verified? Yes  Other? No  Any new allergies since your discharge? No  Dietary orders reviewed? Yes high potassium foods  Do you have support at home? Yes   Home Care and Equipment/Supplies: Were home health services ordered? no If so, what is the name of the agency? N/a  Has the agency set up a time to come to the patient's home? no Were any new equipment or medical supplies ordered?  No What is the name of the medical supply agency? N/a Were you able to get the supplies/equipment? no Do you have any questions related to the use of the equipment or supplies? No  Functional Questionnaire: (I = Independent and D = Dependent) ADLs: I  Bathing/Dressing- I  Meal Prep- I  Eating- I  Maintaining continence- I  Transferring/Ambulation- I  Managing Meds- I  Follow up appointments reviewed:  PCP Hospital f/u appt confirmed? Yes  Scheduled to see Dr.Sanders on 11/24/21 @ 2pm. Specialist Hospital f/u appt confirmed? No  Scheduled to see n/a on n/a @ n/a. Are transportation arrangements needed? No  If their condition worsens, is the pt aware to call PCP or go to the Emergency Dept.? Yes Was the patient provided with contact information for the PCP's office or ED? Yes Was to pt encouraged to call back with questions or concerns? Yes

## 2021-11-25 ENCOUNTER — Encounter: Payer: Self-pay | Admitting: Internal Medicine

## 2021-11-27 ENCOUNTER — Encounter: Payer: Self-pay | Admitting: Internal Medicine

## 2021-11-29 ENCOUNTER — Other Ambulatory Visit: Payer: Self-pay

## 2021-11-29 ENCOUNTER — Ambulatory Visit (INDEPENDENT_AMBULATORY_CARE_PROVIDER_SITE_OTHER): Payer: Medicare Other

## 2021-11-29 VITALS — BP 138/80 | HR 72 | Temp 98.1°F | Ht 65.0 in | Wt 151.0 lb

## 2021-11-29 DIAGNOSIS — E876 Hypokalemia: Secondary | ICD-10-CM

## 2021-11-29 DIAGNOSIS — U099 Post covid-19 condition, unspecified: Secondary | ICD-10-CM

## 2021-11-29 MED ORDER — CYANOCOBALAMIN 1000 MCG/ML IJ SOLN
1000.0000 ug | Freq: Once | INTRAMUSCULAR | Status: AC
  Administered 2021-11-29: 1000 ug via INTRAMUSCULAR

## 2021-11-29 NOTE — Progress Notes (Signed)
Pt here today for labs and first b12 injection.

## 2021-11-30 LAB — BMP8+EGFR
BUN/Creatinine Ratio: 13 (ref 12–28)
BUN: 12 mg/dL (ref 8–27)
CO2: 25 mmol/L (ref 20–29)
Calcium: 10.8 mg/dL — ABNORMAL HIGH (ref 8.7–10.3)
Chloride: 97 mmol/L (ref 96–106)
Creatinine, Ser: 0.9 mg/dL (ref 0.57–1.00)
Glucose: 85 mg/dL (ref 70–99)
Potassium: 4.3 mmol/L (ref 3.5–5.2)
Sodium: 140 mmol/L (ref 134–144)
eGFR: 71 mL/min/{1.73_m2} (ref >59)

## 2021-12-01 ENCOUNTER — Encounter: Payer: Self-pay | Admitting: Internal Medicine

## 2021-12-06 ENCOUNTER — Ambulatory Visit (INDEPENDENT_AMBULATORY_CARE_PROVIDER_SITE_OTHER): Payer: Medicare Other

## 2021-12-06 VITALS — BP 110/80 | HR 78 | Temp 98.1°F | Ht 65.0 in | Wt 151.0 lb

## 2021-12-06 DIAGNOSIS — U099 Post covid-19 condition, unspecified: Secondary | ICD-10-CM

## 2021-12-06 MED ORDER — CYANOCOBALAMIN 1000 MCG/ML IJ SOLN
1000.0000 ug | Freq: Once | INTRAMUSCULAR | Status: AC
  Administered 2021-12-06: 1000 ug via INTRAMUSCULAR

## 2021-12-06 NOTE — Progress Notes (Signed)
Pt here today for second b12 injection.

## 2021-12-08 ENCOUNTER — Ambulatory Visit
Admission: RE | Admit: 2021-12-08 | Discharge: 2021-12-08 | Disposition: A | Discharge status: Home / Self Care | Payer: Medicare Other | Source: Ambulatory Visit | Attending: Internal Medicine | Admitting: Internal Medicine

## 2021-12-08 DIAGNOSIS — Z1231 Encounter for screening mammogram for malignant neoplasm of breast: Secondary | ICD-10-CM

## 2021-12-08 DIAGNOSIS — E2839 Other primary ovarian failure: Secondary | ICD-10-CM

## 2021-12-15 ENCOUNTER — Encounter: Payer: Self-pay | Admitting: Internal Medicine

## 2021-12-15 ENCOUNTER — Ambulatory Visit: Payer: BLUE CROSS/BLUE SHIELD | Admitting: Internal Medicine

## 2021-12-15 VITALS — BP 150/94 | HR 86 | Temp 98.4°F | Ht 65.2 in | Wt 153.6 lb

## 2021-12-15 DIAGNOSIS — R42 Dizziness and giddiness: Secondary | ICD-10-CM | POA: Diagnosis not present

## 2021-12-15 DIAGNOSIS — Z6825 Body mass index (BMI) 25.0-25.9, adult: Secondary | ICD-10-CM

## 2021-12-15 DIAGNOSIS — E876 Hypokalemia: Secondary | ICD-10-CM

## 2021-12-15 DIAGNOSIS — I10 Essential (primary) hypertension: Secondary | ICD-10-CM | POA: Diagnosis not present

## 2021-12-15 DIAGNOSIS — U099 Post covid-19 condition, unspecified: Secondary | ICD-10-CM | POA: Diagnosis not present

## 2021-12-15 MED ORDER — CYANOCOBALAMIN 1000 MCG/ML IJ SOLN
1000.0000 ug | Freq: Once | INTRAMUSCULAR | Status: AC
  Administered 2021-12-15: 1000 ug via INTRAMUSCULAR

## 2021-12-15 MED ORDER — DIAZEPAM 2 MG PO TABS: ORAL_TABLET | ORAL | 0 refills | Status: DC

## 2021-12-15 NOTE — Patient Instructions (Signed)
Hypertension, Adult ?Hypertension is another name for high blood pressure. High blood pressure forces your heart to work harder to pump blood. This can cause problems over time. ?There are two numbers in a blood pressure reading. There is a top number (systolic) over a bottom number (diastolic). It is best to have a blood pressure that is below 120/80. ?What are the causes? ?The cause of this condition is not known. Some other conditions can lead to high blood pressure. ?What increases the risk? ?Some lifestyle factors can make you more likely to develop high blood pressure: ?Smoking. ?Not getting enough exercise or physical activity. ?Being overweight. ?Having too much fat, sugar, calories, or salt (sodium) in your diet. ?Drinking too much alcohol. ?Other risk factors include: ?Having any of these conditions: ?Heart disease. ?Diabetes. ?High cholesterol. ?Kidney disease. ?Obstructive sleep apnea. ?Having a family history of high blood pressure and high cholesterol. ?Age. The risk increases with age. ?Stress. ?What are the signs or symptoms? ?High blood pressure may not cause symptoms. Very high blood pressure (hypertensive crisis) may cause: ?Headache. ?Fast or uneven heartbeats (palpitations). ?Shortness of breath. ?Nosebleed. ?Vomiting or feeling like you may vomit (nauseous). ?Changes in how you see. ?Very bad chest pain. ?Feeling dizzy. ?Seizures. ?How is this treated? ?This condition is treated by making healthy lifestyle changes, such as: ?Eating healthy foods. ?Exercising more. ?Drinking less alcohol. ?Your doctor may prescribe medicine if lifestyle changes do not help enough and if: ?Your top number is above 130. ?Your bottom number is above 80. ?Your personal target blood pressure may vary. ?Follow these instructions at home: ?Eating and drinking ? ?If told, follow the DASH eating plan. To follow this plan: ?Fill one half of your plate at each meal with fruits and vegetables. ?Fill one fourth of your plate  at each meal with whole grains. Whole grains include whole-wheat pasta, brown rice, and whole-grain bread. ?Eat or drink low-fat dairy products, such as skim milk or low-fat yogurt. ?Fill one fourth of your plate at each meal with low-fat (lean) proteins. Low-fat proteins include fish, chicken without skin, eggs, beans, and tofu. ?Avoid fatty meat, cured and processed meat, or chicken with skin. ?Avoid pre-made or processed food. ?Limit the amount of salt in your diet to less than 1,500 mg each day. ?Do not drink alcohol if: ?Your doctor tells you not to drink. ?You are pregnant, may be pregnant, or are planning to become pregnant. ?If you drink alcohol: ?Limit how much you have to: ?0-1 drink a day for women. ?0-2 drinks a day for men. ?Know how much alcohol is in your drink. In the [place] one drink equals one 12 oz bottle of beer (355 mL), one 5 oz glass of wine (148 mL), or one 1? oz glass of hard liquor (44 mL). ?Lifestyle ? ?Work with your doctor to stay at a healthy weight or to lose weight. Ask your doctor what the best weight is for you. ?Get at least 30 minutes of exercise that causes your heart to beat faster (aerobic exercise) most days of the week. This may include walking, swimming, or biking. ?Get at least 30 minutes of exercise that strengthens your muscles (resistance exercise) at least 3 days a week. This may include lifting weights or doing Pilates. ?Do not smoke or use any products that contain nicotine or tobacco. If you need help quitting, ask your doctor. ?Check your blood pressure at home as told by your doctor. ?Keep all follow-up visits. ?Medicines ?Take over-the-counter and prescription medicines   only as told by your doctor. Follow directions carefully. ?Do not skip doses of blood pressure medicine. The medicine does not work as well if you skip doses. Skipping doses also puts you at risk for problems. ?Ask your doctor about side effects or reactions to medicines that you should watch  for. ?Contact a doctor if: ?You think you are having a reaction to the medicine you are taking. ?You have headaches that keep coming back. ?You feel dizzy. ?You have swelling in your ankles. ?You have trouble with your vision. ?Get help right away if: ?You get a very bad headache. ?You start to feel mixed up (confused). ?You feel weak or numb. ?You feel faint. ?You have very bad pain in your: ?Chest. ?Belly (abdomen). ?You vomit more than once. ?You have trouble breathing. ?These symptoms may be an emergency. Get help right away. Call 911. ?Do not wait to see if the symptoms will go away. ?Do not drive yourself to the hospital. ?Summary ?Hypertension is another name for high blood pressure. ?High blood pressure forces your heart to work harder to pump blood. ?For most people, a normal blood pressure is less than 120/80. ?Making healthy choices can help lower blood pressure. If your blood pressure does not get lower with healthy choices, you may need to take medicine. ?This information is not intended to replace advice given to you by your health care provider. Make sure you discuss any questions you have with your health care provider. ?Document Revised: 03/24/2021 Document Reviewed: 03/24/2021 ?Elsevier Patient Education ? 2023 Elsevier Inc. ? ?

## 2021-12-15 NOTE — Progress Notes (Signed)
[name],acting as a Education administrator for [name], MD.,have documented all relevant documentation on the behalf of [name], MD,as directed by  [name], MD while in the presence of [name], MD.  This visit occurred during the SARS-CoV-2 public health emergency.  Safety protocols were in place, including screening questions prior to the visit, additional usage of staff PPE, and extensive cleaning of exam room while observing appropriate contact time as indicated for disinfecting solutions.  Subjective:     Patient ID: Sierra Glover , female    DOB: [birth year] , 66 y.o.   MRN: [id number]   Chief Complaint  Patient presents with   Hypertension    HPI  Patient presents today for a bp check and vitamin b12 shot. She has been getting B12 shots hoping they would help with long COVID symptoms. She has had persistent lightheadedness, does not feel the room is spinning. However, sometimes 1/2 meclizine is effective at improving her sx.   Hypertension This is a chronic problem. The current episode started more than 1 year ago. The problem has been gradually improving since onset. The problem is controlled. Pertinent negatives include no blurred vision, chest pain, palpitations or shortness of breath. Past treatments include diuretics. The current treatment provides moderate improvement.     Past Medical History:  Diagnosis Date   Anemia    before my hysterectomy   Dizziness    DVT (deep venous thrombosis) (HCC)    "Many, many years ago."   HTN (hypertension)    Nausea    Palpitation    Serum calcium elevated      Family History  Problem Relation Age of Onset   Alzheimer's disease Mother    Hypertension Mother    Hypertension Father    COPD Father    Hypertension Other      Current Outpatient Medications:    amLODipine (NORVASC) 10 MG tablet, Take 1 tablet (10 mg total) by mouth daily., Disp: 90 tablet, Rfl: 3   atorvastatin (LIPITOR) 20 MG  tablet, TAKE 1 TABLET BY MOUTH EVERY DAY, Disp: 90 tablet, Rfl: 3   B Complex Vitamins (VITAMIN B COMPLEX PO), Take by mouth., Disp: , Rfl:    ciclopirox (PENLAC) 8 % solution, Apply topically at bedtime. Apply over nail and surrounding skin. Apply daily over previous coat. After seven (7) days, may remove with alcohol and continue cycle., Disp: 6.6 mL, Rfl: 0   diazepam (VALIUM) 2 MG tablet, One tab po 1hr prior to procedure, repeat if needed, Disp: 2 tablet, Rfl: 0   fluticasone (FLONASE) 50 MCG/ACT nasal spray, Place 2 sprays into both nostrils daily as needed for allergies or rhinitis., Disp: 11.1 mL, Rfl: 5   loratadine (CLARITIN) 10 MG tablet, Take 1 tablet (10 mg total) by mouth daily., Disp: 30 tablet, Rfl: 2   meclizine (ANTIVERT) 25 MG tablet, Take 1 tablet (25 mg total) by mouth 2 (two) times daily as needed for dizziness., Disp: 30 tablet, Rfl: 0   ondansetron (ZOFRAN ODT) 8 MG disintegrating tablet, Take 1 tablet (8 mg total) by mouth every 8 (eight) hours as needed for nausea or vomiting., Disp: 20 tablet, Rfl: 0   potassium chloride SA (KLOR-CON M) 20 MEQ tablet, Take 1 3 times a day for 2 days then twice daily., Disp: 50 tablet, Rfl: 0   valACYclovir (VALTREX) 500 MG tablet, take 1 tablet by oral route  every day (Patient taking differently: as needed. take 1 tablet  by oral route  every day), Disp: 90 tablet, Rfl: 0   valsartan-hydrochlorothiazide (DIOVAN-HCT) 160-12.5 MG tablet, TAKE 1 TABLET BY MOUTH EVERY DAY, Disp: 90 tablet, Rfl: 1   Zinc Oxide-Vitamin C (ZINC PLUS VITAMIN C PO), Take by mouth., Disp: , Rfl:    No Known Allergies   Review of Systems  Constitutional: Negative.   Eyes:  Negative for blurred vision.  Respiratory: Negative.  Negative for shortness of breath.   Cardiovascular: Negative.  Negative for chest pain and palpitations.  Gastrointestinal: Negative.   Neurological:  Positive for dizziness and light-headedness.  Psychiatric/Behavioral: Negative.        Today's Vitals   12/15/21 1554 12/15/21 1714 12/15/21 1715  BP: 120/70 140/90 (!) 150/94  Pulse: 82 78 86  Temp: 98.4 F (36.9 C)    Weight: 153 lb 9.6 oz (69.7 kg)    Height: 5' 5.2" (1.656 m)    PainSc: 0-No pain     Body mass index is 25.4 kg/m.  Wt Readings from Last 3 Encounters:  12/15/21 153 lb 9.6 oz (69.7 kg)  12/06/21 151 lb (68.5 kg)  11/29/21 151 lb (68.5 kg)     Objective:  Physical Exam Vitals and nursing note reviewed.  Constitutional:      Appearance: Normal appearance.  HENT:     Head: Normocephalic and atraumatic.     Right Ear: Tympanic membrane, ear canal and external ear normal. There is no impacted cerumen.     Left Ear: Tympanic membrane, ear canal and external ear normal. There is no impacted cerumen.  Eyes:     Extraocular Movements: Extraocular movements intact.  Cardiovascular:     Rate and Rhythm: Normal rate and regular rhythm.     Heart sounds: Normal heart sounds.  Pulmonary:     Effort: Pulmonary effort is normal.     Breath sounds: Normal breath sounds.  Musculoskeletal:     Cervical back: Normal range of motion.     Right lower leg: Edema present.     Left lower leg: Edema present.     Comments: Trace b/l edema  Skin:    General: Skin is warm.  Neurological:     General: No focal deficit present.     Mental Status: She is alert.  Psychiatric:        Mood and Affect: Mood normal.        Behavior: Behavior normal.      Assessment And Plan:     1. Essential hypertension, benign Comments: Chronic, initially controlled. However, BP readings during orthostatics were elevated. She should take valsartan/hctz in AM and amlodipine in PM. F/u 3 months. She should check BP readings at home. If persistently elevated, I will adjust meds as needed. She may need another medication for optimal BP control.   2. Dizziness Comments: Persistent. I will check MRI brain today. I will also refer her to Neurology for further evaluation.  - MR  Brain Wo Contrast; Future  3. Long COVID Comments: She has been having persistent episodes of dizziness since having COVID in October 2022. - cyanocobalamin ((VITAMIN B-12)) injection 1,000 mcg  4. Hypokalemia Comments: She is still on supplemental potassium. I will check repeat K+ level 2-3 weeks after she has completed therapy. Advised to increase intake of K+ rich foods. - BMP8+eGFR  5. BMI 25.0-25.9,adult  She is encouraged to aim for at least 150 minutes of exercise per week.    Patient was given opportunity to ask questions. Patient verbalized understanding  of the plan and was able to repeat key elements of the plan. All questions were answered to their satisfaction.   I, [name], MD, have reviewed all documentation for this visit. The documentation on 12/15/21 for the exam, diagnosis, procedures, and orders are all accurate and complete.   IF YOU HAVE BEEN REFERRED TO A SPECIALIST, IT MAY TAKE 1-2 WEEKS TO SCHEDULE/PROCESS THE REFERRAL. IF YOU HAVE NOT HEARD FROM US/SPECIALIST IN TWO WEEKS, PLEASE GIVE [place] A CALL AT [phone].   THE PATIENT IS ENCOURAGED TO PRACTICE SOCIAL DISTANCING DUE TO THE COVID-19 PANDEMIC.

## 2022-01-04 ENCOUNTER — Encounter: Payer: Self-pay | Admitting: Internal Medicine

## 2022-01-06 ENCOUNTER — Other Ambulatory Visit: Payer: Self-pay | Admitting: Internal Medicine

## 2022-01-06 LAB — BMP8+EGFR
BUN/Creatinine Ratio: 11 — ABNORMAL LOW (ref 12–28)
BUN: 10 mg/dL (ref 8–27)
CO2: 27 mmol/L (ref 20–29)
Calcium: 10.7 mg/dL — ABNORMAL HIGH (ref 8.7–10.3)
Chloride: 100 mmol/L (ref 96–106)
Creatinine, Ser: 0.9 mg/dL (ref 0.57–1.00)
Glucose: 91 mg/dL (ref 70–99)
Potassium: 3.7 mmol/L (ref 3.5–5.2)
Sodium: 142 mmol/L (ref 134–144)
eGFR: 71 mL/min/{1.73_m2} (ref >59)

## 2022-01-09 ENCOUNTER — Encounter: Payer: Self-pay | Admitting: Internal Medicine

## 2022-01-16 ENCOUNTER — Encounter: Payer: Self-pay | Admitting: Neurology

## 2022-01-16 ENCOUNTER — Ambulatory Visit: Payer: Medicare Other | Admitting: Neurology

## 2022-01-16 VITALS — BP 133/75 | HR 90 | Ht 62.2 in | Wt 153.0 lb

## 2022-01-16 DIAGNOSIS — R42 Dizziness and giddiness: Secondary | ICD-10-CM | POA: Diagnosis not present

## 2022-01-16 DIAGNOSIS — J3089 Other allergic rhinitis: Secondary | ICD-10-CM | POA: Insufficient documentation

## 2022-01-16 NOTE — Patient Instructions (Signed)
Continue current medications Continue with meclizine as needed Follow-up with your PCP Return as needed.

## 2022-01-16 NOTE — Progress Notes (Signed)
[facility]  PATIENT: Sierra Glover DOB: 1956-03-20  REQUESTING CLINICIAN: Dorothyann Peng, MD HISTORY FROM: Patient  REASON FOR VISIT: Lightheadedness    HISTORICAL  CHIEF COMPLAINT:  Chief Complaint  Patient presents with   New Patient (Initial Visit)    Rm 12. Alone. NP/Internal referral for near syncope.    HISTORY OF PRESENT ILLNESS:  This is a 66 year old woman with past medical history of hypertension, hyperlipidemia who is presenting with symptoms of lightheadedness, intermittent since December last year.  Patient described his symptoms as feeling lightheaded, denies any room spinning sensation, denies any nausea, no ataxia with ambulation.  She reported the symptoms can happen anytime, when sitting down, sometimes standing.  She said during the episode or when she feels it coming, she will get nervous, have heart palpitations that will make the symptoms worse.  Patient reports being diagnosed with COVID in October last year and the symptoms started in December.  She was prescribed meclizine in the past but wkth with half a dose of the meclizine can help when she feels the symptoms coming.  She also reports during this period she had her blood pressure change after being diagnosed with COVID currently she is taking amlodipine and Diovan/HCT.  She reports her sleep is good, diet is good, drink about three 8 ounce bottle of water a day, denies any headache, no vision changes, no focal weakness. Her PMD ordered MRI brain which has not been completed yet.     OTHER MEDICAL CONDITIONS: Hypertension, Hyperlipidemia    REVIEW OF SYSTEMS: Full 14 system review of systems performed and negative with exception of:  as noted in the HPI   ALLERGIES: No Known Allergies  HOME MEDICATIONS: Outpatient Medications Prior to Visit  Medication Sig Dispense Refill   amLODipine (NORVASC) 10 MG tablet Take 1 tablet (10 mg total) by mouth daily. 90 tablet 3    atorvastatin (LIPITOR) 20 MG tablet TAKE 1 TABLET BY MOUTH EVERY DAY 90 tablet 3   B Complex Vitamins (VITAMIN B COMPLEX PO) Take by mouth.     ciclopirox (PENLAC) 8 % solution Apply topically at bedtime. Apply over nail and surrounding skin. Apply daily over previous coat. After seven (7) days, may remove with alcohol and continue cycle. 6.6 mL 0   diazepam (VALIUM) 2 MG tablet One tab po 1hr prior to procedure, repeat if needed 2 tablet 0   fluticasone (FLONASE) 50 MCG/ACT nasal spray Place 2 sprays into both nostrils daily as needed for allergies or rhinitis. 11.1 mL 5   loratadine (CLARITIN) 10 MG tablet Take 1 tablet (10 mg total) by mouth daily. 30 tablet 2   meclizine (ANTIVERT) 25 MG tablet Take 1 tablet (25 mg total) by mouth 2 (two) times daily as needed for dizziness. 30 tablet 0   ondansetron (ZOFRAN ODT) 8 MG disintegrating tablet Take 1 tablet (8 mg total) by mouth every 8 (eight) hours as needed for nausea or vomiting. 20 tablet 0   potassium chloride SA (KLOR-CON M) 20 MEQ tablet Take 1 3 times a day for 2 days then twice daily. 50 tablet 0   valACYclovir (VALTREX) 500 MG tablet take 1 tablet by oral route  every day (Patient taking differently: as needed. take 1 tablet by oral route  every day) 90 tablet 0   valsartan-hydrochlorothiazide (DIOVAN-HCT) 160-12.5 MG tablet TAKE 1 TABLET BY MOUTH EVERY DAY 90 tablet 1   Zinc Oxide-Vitamin C (ZINC PLUS VITAMIN C PO) Take by mouth.  No facility-administered medications prior to visit.    PAST MEDICAL HISTORY: Past Medical History:  Diagnosis Date   Anemia    before my hysterectomy   Dizziness    DVT (deep venous thrombosis) (HCC)    "Many, many years ago."   HTN (hypertension)    Nausea    Palpitation    Serum calcium elevated     PAST SURGICAL HISTORY: Past Surgical History:  Procedure Laterality Date   COLONOSCOPY     partal hysterectomy  2009   TIBIA IM NAIL INSERTION Right 01/07/2021   Procedure: INTRAMEDULLARY  (IM) NAIL TIBIAL;  Surgeon: Roby Lofts, MD;  Location: [facility] OR;  Service: Orthopedics;  Laterality: Right;    FAMILY HISTORY: Family History  Problem Relation Age of Onset   Alzheimer's disease Mother    Hypertension Mother    Hypertension Father    COPD Father    Hypertension Other     SOCIAL HISTORY: Social History   Socioeconomic History   Marital status: Married    Spouse name: Not on file   Number of children: Not on file   Years of education: Not on file   Highest education level: Not on file  Occupational History   Not on file  Tobacco Use   Smoking status: Never   Smokeless tobacco: Never   Tobacco comments:    N/a  Vaping Use   Vaping Use: Never used  Substance and Sexual Activity   Alcohol use: Not Currently    Comment: social   Drug use: Never   Sexual activity: Yes  Other Topics Concern   Not on file  Social History Narrative   Not on file   Social Determinants of Health   Financial Resource Strain: Not on file  Food Insecurity: No Food Insecurity (06/07/2021)   Hunger Vital Sign    Worried About Running Out of Food in the Last Year: Never true    Ran Out of Food in the Last Year: Never true  Transportation Needs: Not on file  Physical Activity: Not on file  Stress: Not on file  Social Connections: Not on file  Intimate Partner Violence: Not on file    PHYSICAL EXAM  GENERAL EXAM/CONSTITUTIONAL: Vitals:  Vitals:   01/16/22 0801  BP: 133/75  Pulse: 90  Weight: 153 lb (69.4 kg)  Height: 5' 2.2" (1.58 m)   Body mass index is 27.8 kg/m. Wt Readings from Last 3 Encounters:  01/16/22 153 lb (69.4 kg)  12/15/21 153 lb 9.6 oz (69.7 kg)  12/06/21 151 lb (68.5 kg)   Patient is in no distress; well developed, nourished and groomed; neck is supple  EYES: Pupils round and reactive to light, Visual fields full to confrontation, Extraocular movements intacts,   MUSCULOSKELETAL: Gait, strength, tone, movements noted in Neurologic exam  below  NEUROLOGIC: MENTAL STATUS:      No data to display         awake, alert, oriented to person, place and time recent and remote memory intact normal attention and concentration language fluent, comprehension intact, naming intact fund of knowledge appropriate  CRANIAL NERVE:  2nd, 3rd, 4th, 6th - pupils equal and reactive to light, visual fields full to confrontation, extraocular muscles intact, no nystagmus 5th - facial sensation symmetric 7th - facial strength symmetric 8th - hearing intact 9th - palate elevates symmetrically, uvula midline 11th - shoulder shrug symmetric 12th - tongue protrusion midline  MOTOR:  normal bulk and tone, full strength in the BUE,  BLE  SENSORY:  normal and symmetric to light touch, vibration  COORDINATION:  finger-nose-finger, fine finger movements normal  REFLEXES:  deep tendon reflexes present and symmetric  GAIT/STATION:  normal   DIAGNOSTIC DATA (LABS, IMAGING, TESTING) - I reviewed patient records, labs, notes, testing and imaging myself where available.  Lab Results  Component Value Date   WBC 7.4 11/23/2021   HGB 12.3 11/23/2021   HCT 37.5 11/23/2021   MCV 87.0 11/23/2021   PLT 324 11/23/2021      Component Value Date/Time   NA 142 01/06/2022 0838   K 3.7 01/06/2022 0838   CL 100 01/06/2022 0838   CO2 27 01/06/2022 0838   GLUCOSE 91 01/06/2022 0838   GLUCOSE 104 (H) 11/23/2021 1517   BUN 10 01/06/2022 0838   CREATININE 0.90 01/06/2022 0838   CALCIUM 10.7 (H) 01/06/2022 0838   PROT 7.8 06/07/2021 1234   ALBUMIN 4.7 06/07/2021 1234   AST 19 06/07/2021 1234   ALT 16 06/07/2021 1234   ALKPHOS 114 06/07/2021 1234   BILITOT 0.9 06/07/2021 1234   GFRNONAA >60 11/23/2021 1517   GFRAA 47 (L) 05/31/2020 1522   Lab Results  Component Value Date   CHOL 147 06/07/2021   HDL 60 06/07/2021   LDLCALC 70 06/07/2021   TRIG 92 06/07/2021   CHOLHDL 2.5 06/07/2021   Lab Results  Component Value Date   HGBA1C 5.3  11/29/2020   Lab Results  Component Value Date   VITAMINB12 1,111 06/07/2021   Lab Results  Component Value Date   TSH 1.470 05/19/2021     ASSESSMENT AND PLAN  66 y.o. year old female with history of hypertension and hyperlipidemia who is presenting with intermittent episodes of lightheadedness since December of last year.  Patient denies any room spinning sensation, denies feeling off balance, denies feeling unsteady, denies any falls, no nausea no vomiting.  On exam today, her neurological examination is normal. No signs of ataxia and no peripheral neuropathy. I advised her to increase her fluid intake, to proceed with MRI brain and to return if her symptoms worsen.  There is no hearing loss there is no tinnitus.  She was diagnosed with COVID in October 2022 and her symptoms started in December, I did inform her that it might be related to long COVID.  Return as needed.   1. Lightheadedness      Patient Instructions  Continue current medications Continue with meclizine as needed Follow-up with your PCP Return as needed.  No orders of the defined types were placed in this encounter.   No orders of the defined types were placed in this encounter.   Return if symptoms worsen or fail to improve.  I have spent a total of 45 minutes dedicated to this patient today, preparing to see patient, performing a medically appropriate examination and evaluation, ordering tests and/or medications and procedures, and counseling and educating the patient/family/caregiver; independently interpreting result and communicating results to the family/patient/caregiver; and documenting clinical information in the electronic medical record.   Windell Norfolk, MD 01/16/2022, 10:01 AM  [facility] [address] [place], [place] [address] [phone]

## 2022-01-17 ENCOUNTER — Encounter: Payer: Self-pay | Admitting: Internal Medicine

## 2022-01-24 ENCOUNTER — Encounter: Payer: Self-pay | Admitting: Neurology

## 2022-01-25 NOTE — Progress Notes (Unsigned)
Cardiology Office Note    Date:  01/25/2022   ID:  SAMEERAH NACHTIGAL, DOB [birth year], MRN [id number]  PCP:  Dorothyann Peng, MD  Cardiologist: Christell Constant, MD    No chief complaint on file.   History of Present Illness:    Sierra Glover is a 66 y.o. female with past medical history of chest pain (low-risk NST in 06/2021), HTN and prior DVT who presents to the office today for evaluation of chest pain.  She most recently had a telehealth visit with myself in 09/2021 and reported having occasional palpitations but they had decreased in frequency and severity. Recent Lexiscan Myoview was low-risk and it was recommended to increase her activity to build up her exercise tolerance.  She recently sent a MyChart message reporting episodes of chest pain and a follow-up visit was arranged.  - Coronary CT?  Past Medical History:  Diagnosis Date   Anemia    before my hysterectomy   Dizziness    DVT (deep venous thrombosis) (HCC)    "Many, many years ago."   HTN (hypertension)    Nausea    Palpitation    Serum calcium elevated     Past Surgical History:  Procedure Laterality Date   COLONOSCOPY     partal hysterectomy  2009   TIBIA IM NAIL INSERTION Right 01/07/2021   Procedure: INTRAMEDULLARY (IM) NAIL TIBIAL;  Surgeon: Roby Lofts, MD;  Location: [facility] OR;  Service: Orthopedics;  Laterality: Right;    Current Medications: Outpatient Medications Prior to Visit  Medication Sig Dispense Refill   amLODipine (NORVASC) 10 MG tablet Take 1 tablet (10 mg total) by mouth daily. 90 tablet 3   atorvastatin (LIPITOR) 20 MG tablet TAKE 1 TABLET BY MOUTH EVERY DAY 90 tablet 3   B Complex Vitamins (VITAMIN B COMPLEX PO) Take by mouth.     ciclopirox (PENLAC) 8 % solution Apply topically at bedtime. Apply over nail and surrounding skin. Apply daily over previous coat. After seven (7) days, may remove with alcohol and continue cycle. 6.6 mL 0   diazepam (VALIUM) 2 MG tablet One tab  po 1hr prior to procedure, repeat if needed 2 tablet 0   fluticasone (FLONASE) 50 MCG/ACT nasal spray Place 2 sprays into both nostrils daily as needed for allergies or rhinitis. 11.1 mL 5   loratadine (CLARITIN) 10 MG tablet Take 1 tablet (10 mg total) by mouth daily. 30 tablet 2   meclizine (ANTIVERT) 25 MG tablet Take 1 tablet (25 mg total) by mouth 2 (two) times daily as needed for dizziness. 30 tablet 0   ondansetron (ZOFRAN ODT) 8 MG disintegrating tablet Take 1 tablet (8 mg total) by mouth every 8 (eight) hours as needed for nausea or vomiting. 20 tablet 0   potassium chloride SA (KLOR-CON M) 20 MEQ tablet Take 1 3 times a day for 2 days then twice daily. 50 tablet 0   valACYclovir (VALTREX) 500 MG tablet take 1 tablet by oral route  every day (Patient taking differently: as needed. take 1 tablet by oral route  every day) 90 tablet 0   valsartan-hydrochlorothiazide (DIOVAN-HCT) 160-12.5 MG tablet TAKE 1 TABLET BY MOUTH EVERY DAY 90 tablet 1   Zinc Oxide-Vitamin C (ZINC PLUS VITAMIN C PO) Take by mouth.     No facility-administered medications prior to visit.     Allergies:   Patient has no known allergies.   Social History   Socioeconomic History   Marital status: Married  Spouse name: Not on file   Number of children: Not on file   Years of education: Not on file   Highest education level: Not on file  Occupational History   Not on file  Tobacco Use   Smoking status: Never   Smokeless tobacco: Never   Tobacco comments:    N/a  Vaping Use   Vaping Use: Never used  Substance and Sexual Activity   Alcohol use: Not Currently    Comment: social   Drug use: Never   Sexual activity: Yes  Other Topics Concern   Not on file  Social History Narrative   Not on file   Social Determinants of Health   Financial Resource Strain: Not on file  Food Insecurity: No Food Insecurity (06/07/2021)   Hunger Vital Sign    Worried About Running Out of Food in the Last Year: Never true     Ran Out of Food in the Last Year: Never true  Transportation Needs: Not on file  Physical Activity: Not on file  Stress: Not on file  Social Connections: Not on file     Family History:  The patient's ***family history includes Alzheimer's disease in her mother; COPD in her father; Hypertension in her father, mother, and another family member.   Review of Systems:    Please see the history of present illness.     All other systems reviewed and are otherwise negative except as noted above.   Physical Exam:    VS:  There were no vitals taken for this visit.   General: Well developed, well nourished,female appearing in no acute distress. Head: Normocephalic, atraumatic. Neck: No carotid bruits. JVD not elevated.  Lungs: Respirations regular and unlabored, without wheezes or rales.  Heart: ***Regular rate and rhythm. No S3 or S4.  No murmur, no rubs, or gallops appreciated. Abdomen: Appears non-distended. No obvious abdominal masses. Msk:  Strength and tone appear normal for age. No obvious joint deformities or effusions. Extremities: No clubbing or cyanosis. No edema.  Distal pedal pulses are 2+ bilaterally. Neuro: Alert and oriented X 3. Moves all extremities spontaneously. No focal deficits noted. Psych:  Responds to questions appropriately with a normal affect. Skin: No rashes or lesions noted  Wt Readings from Last 3 Encounters:  01/16/22 153 lb (69.4 kg)  12/15/21 153 lb 9.6 oz (69.7 kg)  12/06/21 151 lb (68.5 kg)        Studies/Labs Reviewed:   EKG:  EKG is*** ordered today.  The ekg ordered today demonstrates ***  Recent Labs: 05/19/2021: TSH 1.470 06/07/2021: ALT 16 11/23/2021: Hemoglobin 12.3; Magnesium 2.1; Platelets 324 01/06/2022: BUN 10; Creatinine, Ser 0.90; Potassium 3.7; Sodium 142   Lipid Panel    Component Value Date/Time   CHOL 147 06/07/2021 1234   TRIG 92 06/07/2021 1234   HDL 60 06/07/2021 1234   CHOLHDL 2.5 06/07/2021 1234   LDLCALC 70  06/07/2021 1234    Additional studies/ records that were reviewed today include:   NST: 06/2021   The study is normal. The study is low risk.   No ST deviation was noted.   Left ventricular function is normal. Nuclear stress EF: 73 %. The left ventricular ejection fraction is hyperdynamic (>65%). End diastolic cavity size is normal. End systolic cavity size is normal.   Prior study not available for comparison.   Normal stress nuclear study with no ischemia or infarction.  Gated ejection fraction 73% with normal wall motion.  Event Monitor: 06/2021 Patient had  a minimum heart rate of 47 bpm, maximum heart rate of 165 bpm, and average heart rate of 74 bpm. Predominant underlying rhythm was sinus rhythm. Isolated PACs were rare (<1.0%). Isolated PVCs were rare (<1.0%). No triggered events.   No malignant arrhythmias.  Assessment:    No diagnosis found.   Plan:   In order of problems listed above:  1. Chest Pain - She had a low-risk NST in 06/2021. ***  2. Palpitations - Prior monitor in 06/2021 showed rare PAC's and PVC's but a less than 1% burden.   3. HTN - ***  4. HLD - FLP in 05/2021 showed total cholesterol 147, triglycerides 92, HDL 60 and LDL 70. She remains on Atorvastatin 20 mg daily.   Shared Decision Making/Informed Consent:   {Are you ordering a CV Procedure (e.g. stress test, cath, DCCV, TEE, etc)?   Press F2        :[id number]}    Medication Adjustments/Labs and Tests Ordered: Current medicines are reviewed at length with the patient today.  Concerns regarding medicines are outlined above.  Medication changes, Labs and Tests ordered today are listed in the Patient Instructions below. There are no Patient Instructions on file for this visit.   Signed, Ellsworth Lennox, PA-C  01/25/2022 10:30 AM    [facility] [address] [place], [place] [address] Phone: [phone] Fax: [phone]

## 2022-01-26 ENCOUNTER — Encounter: Payer: Self-pay | Admitting: Student

## 2022-01-26 ENCOUNTER — Ambulatory Visit: Payer: Medicare Other | Admitting: Student

## 2022-01-26 VITALS — BP 126/80 | HR 68 | Ht 65.5 in | Wt 154.6 lb

## 2022-01-26 DIAGNOSIS — R002 Palpitations: Secondary | ICD-10-CM | POA: Diagnosis not present

## 2022-01-26 DIAGNOSIS — I10 Essential (primary) hypertension: Secondary | ICD-10-CM

## 2022-01-26 DIAGNOSIS — R072 Precordial pain: Secondary | ICD-10-CM | POA: Diagnosis not present

## 2022-01-26 DIAGNOSIS — R079 Chest pain, unspecified: Secondary | ICD-10-CM | POA: Diagnosis not present

## 2022-01-26 DIAGNOSIS — E785 Hyperlipidemia, unspecified: Secondary | ICD-10-CM

## 2022-01-26 MED ORDER — METOPROLOL TARTRATE 50 MG PO TABS
50.0000 mg | ORAL_TABLET | ORAL | 3 refills | Status: DC
Start: 2022-01-26 — End: 2022-03-31

## 2022-01-26 NOTE — Patient Instructions (Signed)
Medication Instructions:   Take Lopressor 50 mg 2 hours prior to CT Scan   *If you need a refill on your cardiac medications before your next appointment, please call your pharmacy*   Lab Work: NONE   If you have labs (blood work) drawn today and your tests are completely normal, you will receive your results only by: MyChart Message (if you have MyChart) OR A paper copy in the mail If you have any lab test that is abnormal or we need to change your treatment, we will call you to review the results.   Testing/Procedures:   Your cardiac CT will be scheduled at one of the below locations:   [facility] [address] [place], [place] [address] [phone]  OR  [facility] [address] [place], [place] [address] [phone]  If scheduled at [facility], please arrive at the [facility] and Children's Entrance (Entrance C2) of [facility] 30 minutes prior to test start time. You can use the FREE valet parking offered at entrance C (encouraged to control the heart rate for the test)  Proceed to the [facility] Radiology Department (first floor) to check-in and test prep.  All radiology patients and guests should use entrance C2 at [facility], accessed from [facility], even though the hospital's physical address listed is [address]    If scheduled at [facility], please arrive 15 mins early for check-in and test prep.  Please follow these instructions carefully (unless otherwise directed):  On the Night Before the Test: Be sure to Drink plenty of water. Do not consume any caffeinated/decaffeinated beverages or chocolate 12 hours prior to your test. Do not take any antihistamines 12 hours prior to your test.  On the Day of the Test: Drink plenty of water until 1 hour prior to the test. Do not eat any food 4 hours prior to the test. You  may take your regular medications prior to the test.  Take metoprolol (Lopressor) two hours prior to test. HOLD Furosemide/Hydrochlorothiazide morning of the test. FEMALES- please wear underwire-free bra if available, avoid dresses & tight clothing   *For Clinical Staff only. Please instruct patient the following:* Heart Rate Medication Recommendations for Cardiac CT  Resting HR < 50 bpm  No medication  Resting HR 50-60 bpm and BP >110/50 mmHG   Consider Metoprolol tartrate 25 mg PO 90-120 min prior to scan  Resting HR 60-65 bpm and BP >110/50 mmHG  Metoprolol tartrate 50 mg PO 90-120 minutes prior to scan   Resting HR > 65 bpm and BP >110/50 mmHG  Metoprolol tartrate 100 mg PO 90-120 minutes prior to scan  Consider Ivabradine 10-15 mg PO or a calcium channel blocker for resting HR >60 bpm and contraindication to metoprolol tartrate  Consider Ivabradine 10-15 mg PO in combination with metoprolol tartrate for HR >80 bpm         After the Test: Drink plenty of water. After receiving IV contrast, you may experience a mild flushed feeling. This is normal. On occasion, you may experience a mild rash up to 24 hours after the test. This is not dangerous. If this occurs, you can take Benadryl 25 mg and increase your fluid intake. If you experience trouble breathing, this can be serious. If it is severe call 911 IMMEDIATELY. If it is mild, please call our office. If you take any of these medications: Glipizide/Metformin, Avandament, Glucavance, please  do not take 48 hours after completing test unless otherwise instructed.  We will call to schedule your test 2-4 weeks out understanding that some insurance companies will need an authorization prior to the service being performed.   For non-scheduling related questions, please contact the cardiac imaging nurse navigator should you have any questions/concerns: Rockwell Alexandria, Cardiac Imaging Nurse Navigator Larey Brick, Cardiac Imaging Nurse  Navigator [facility] Direct Office Dial: [phone]   For scheduling needs, including cancellations and rescheduling, please call [place], [phone].     Follow-Up: At [facility], you and your health needs are our priority.  As part of our continuing mission to provide you with exceptional heart care, we have created designated Provider Care Teams.  These Care Teams include your primary Cardiologist (physician) and Advanced Practice Providers (APPs -  Physician Assistants and Nurse Practitioners) who all work together to provide you with the care you need, when you need it.  We recommend signing up for the patient portal called "MyChart".  Sign up information is provided on this After Visit Summary.  MyChart is used to connect with patients for Virtual Visits (Telemedicine).  Patients are able to view lab/test results, encounter notes, upcoming appointments, etc.  Non-urgent messages can be sent to your provider as well.   To learn more about what you can do with MyChart, go to [email or web address].    Your next appointment:    October   The format for your next appointment:   In Person  Provider:   You may see Christell Constant, MD or one of the following Advanced Practice Providers on your designated Care Team:   Randall An, PA-C  Jacolyn Reedy, PA-C     Other Instructions Thank you for choosing [facility]!    Important Information About Sugar

## 2022-01-31 ENCOUNTER — Encounter: Payer: Self-pay | Admitting: Internal Medicine

## 2022-02-10 ENCOUNTER — Telehealth (HOSPITAL_COMMUNITY): Payer: Self-pay | Admitting: Emergency Medicine

## 2022-02-10 NOTE — Telephone Encounter (Signed)
Reaching out to patient to offer assistance regarding upcoming cardiac imaging study; pt verbalizes understanding of appt date/time, parking situation and where to check in, pre-test NPO status and medications ordered, and verified current allergies; name and call back number provided for further questions should they arise Rockwell Alexandria RN Navigator Cardiac Imaging [facility] [phone] office [phone] cell  Arrival 300 w/c entrance Denies iv  Claustro 50mg  metoprolol  Holding diovan and allergy meds

## 2022-02-13 ENCOUNTER — Ambulatory Visit (HOSPITAL_COMMUNITY)
Admission: RE | Admit: 2022-02-13 | Discharge: 2022-02-13 | Disposition: A | Discharge status: Home / Self Care | Payer: Medicare Other | Source: Ambulatory Visit | Attending: Student | Admitting: Student

## 2022-02-13 DIAGNOSIS — R072 Precordial pain: Secondary | ICD-10-CM | POA: Insufficient documentation

## 2022-02-13 MED ORDER — IOHEXOL 350 MG/ML SOLN
100.0000 mL | Freq: Once | INTRAVENOUS | Status: AC | PRN
  Administered 2022-02-13: 100 mL via INTRAVENOUS

## 2022-02-13 MED ORDER — NITROGLYCERIN 0.4 MG SL SUBL
SUBLINGUAL_TABLET | SUBLINGUAL | Status: AC
  Filled 2022-02-13: qty 2

## 2022-02-13 MED ORDER — NITROGLYCERIN 0.4 MG SL SUBL
0.8000 mg | SUBLINGUAL_TABLET | SUBLINGUAL | Status: DC | PRN
  Administered 2022-02-13: 0.8 mg via SUBLINGUAL

## 2022-03-30 NOTE — Progress Notes (Signed)
Cardiology Office Note:    Date:  03/31/2022   ID:  Sierra Glover, DOB 1956-04-19, MRN [id number]  PCP:  Dorothyann Peng, MD   [facility] Providers Cardiologist:  Christell Constant, MD     Referring MD: Dorothyann Peng, MD   CC: Follow up  cardiac CT  History of Present Illness:    Sierra Glover is a 66 y.o. female with a hx of HTN, Remote DVT, who presents for evaluation 06/23/21. 2023: negative stress test.  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  Normal heart monitor.  Patient notes that she is doing better.   There are no interval hospital/ED visit.   Has good days and bad days. Has had some light-headedness that improved with occasional meclizine.  Still have occasional chest pain.   Had grilled cheese and tomato soup last night. Has some chest discomfort. Went to a volleyball and had some crackers.  Had some chest pain that night. Has some chest discomfort if she eats then lies down. Her granddaughter now; it is a bit stressful at home. No SOB/DOE and no PND/Orthopnea.  No weight gain or leg swelling.  Palpitations are nearly gone.   Past Medical History:  Diagnosis Date   Anemia    before my hysterectomy   Dizziness    DVT (deep venous thrombosis) (HCC)    "Many, many years ago."   HTN (hypertension)    Nausea    Palpitation    Serum calcium elevated     Past Surgical History:  Procedure Laterality Date   COLONOSCOPY     partal hysterectomy  2009   TIBIA IM NAIL INSERTION Right 01/07/2021   Procedure: INTRAMEDULLARY (IM) NAIL TIBIAL;  Surgeon: Roby Lofts, MD;  Location: [facility] OR;  Service: Orthopedics;  Laterality: Right;    Current Medications: Current Meds  Medication Sig   amLODipine (NORVASC) 10 MG tablet Take 1 tablet (10 mg total) by mouth daily.   atorvastatin (LIPITOR) 20 MG tablet TAKE 1 TABLET BY MOUTH EVERY DAY   B Complex Vitamins (VITAMIN B COMPLEX PO) Take by  mouth.   ciclopirox (PENLAC) 8 % solution Apply topically at bedtime. Apply over nail and surrounding skin. Apply daily over previous coat. After seven (7) days, may remove with alcohol and continue cycle.   fluticasone (FLONASE) 50 MCG/ACT nasal spray Place 2 sprays into both nostrils daily as needed for allergies or rhinitis.   loratadine (CLARITIN) 10 MG tablet Take 1 tablet (10 mg total) by mouth daily.   meclizine (ANTIVERT) 25 MG tablet Take 1 tablet (25 mg total) by mouth 2 (two) times daily as needed for dizziness.   ondansetron (ZOFRAN ODT) 8 MG disintegrating tablet Take 1 tablet (8 mg total) by mouth every 8 (eight) hours as needed for nausea or vomiting.   pantoprazole (PROTONIX) 40 MG tablet Take 1 tablet (40 mg total) by mouth daily.   valACYclovir (VALTREX) 500 MG tablet take 1 tablet by oral route  every day (Patient taking differently: as needed. take 1 tablet by oral route  every day)   valsartan-hydrochlorothiazide (DIOVAN-HCT) 160-12.5 MG tablet TAKE 1 TABLET BY MOUTH EVERY DAY     Allergies:   Patient has no known allergies.   Social History   Socioeconomic History   Marital status: Married    Spouse name: Not on file   Number of children: Not on file   Years of education: Not on file  Highest education level: Not on file  Occupational History   Not on file  Tobacco Use   Smoking status: Never   Smokeless tobacco: Never   Tobacco comments:    N/a  Vaping Use   Vaping Use: Never used  Substance and Sexual Activity   Alcohol use: Not Currently    Comment: social   Drug use: Never   Sexual activity: Yes  Other Topics Concern   Not on file  Social History Narrative   Not on file   Social Determinants of Health   Financial Resource Strain: Not on file  Food Insecurity: No Food Insecurity (06/07/2021)   Hunger Vital Sign    Worried About Running Out of Food in the Last Year: Never true    Ran Out of Food in the Last Year: Never true  Transportation  Needs: Not on file  Physical Activity: Not on file  Stress: Not on file  Social Connections: Not on file     Family History: The patient's family history includes Alzheimer's disease in her mother; COPD in her father; Hypertension in her father, mother, and another family member. No family history of heart disease.  ROS:   Please see the history of present illness.     All other systems reviewed and are negative.  EKGs/Labs/Other Studies Reviewed:    The following studies were reviewed today:  EKG:   06/23/20: Sr rate 74 WNL  GATED SPECT MYO PERF W/EXERCISE STRESS 1D 07/14/2021  Narrative   The study is normal. The study is low risk.   No ST deviation was noted.   Left ventricular function is normal. Nuclear stress EF: 73 %. The left ventricular ejection fraction is hyperdynamic (>65%). End diastolic cavity size is normal. End systolic cavity size is normal.   Prior study not available for comparison.  Normal stress nuclear study with no ischemia or infarction.  Gated ejection fraction 73% with normal wall motion.   LONG TERM MONITOR (8-14 DAYS) INTERPRETATION 07/25/2021  Narrative  Patient had a minimum heart rate of 47 bpm, maximum heart rate of 165 bpm, and average heart rate of 74 bpm.  Predominant underlying rhythm was sinus rhythm.  Isolated PACs were rare (<1.0%).  Isolated PVCs were rare (<1.0%).  No triggered events.  No malignant arrhythmias.   CT CORONARY MORPH W/CTA COR W/SCORE W/CA W/CM &/OR WO/CM 02/13/2022  Addendum 02/14/2022 10:00 AM ADDENDUM REPORT: 02/14/2022 09:58  EXAM: OVER-READ INTERPRETATION  CT CHEST  The following report is an over-read performed by radiologist Dr. Schuyler Amor [facility] on 02/14/2022. This over-read does not include interpretation of cardiac or coronary anatomy or pathology. The coronary CTA interpretation by the cardiologist is attached.  COMPARISON:  None.  FINDINGS: Vascular: No acute  abnormality.  Mediastinum/nodes: No mass or adenopathy identified. Patulous esophagus may reflect underlying dysmotility.  Lungs/pleura: No pleural effusion, airspace consolidation, or pneumothorax. No suspicious lung nodules identified.  Upper abdomen: No acute abnormality.  Musculoskeletal: No chest wall mass. No acute or suspicious osseous findings.  IMPRESSION: 1. No significant noncardiac supplemental findings. 2. Patulous esophagus, which may reflect underlying dysmotility.   Electronically Signed By: Signa Kell M.D. On: 02/14/2022 09:58  Narrative CLINICAL DATA:  66 Year old African American Female  EXAM: Cardiac/Coronary  CTA  TECHNIQUE: The patient was scanned on a [employer].  FINDINGS: Scan was triggered in the descending thoracic aorta. Axial non-contrast 3 mm slices were carried out through the heart. The data set was analyzed  on a dedicated work station and scored using the [employer] . Gantry rotation speed was 250 msecs and collimation was .6 mm. 0.8 mg of sl NTG was given. The 3D data set was reconstructed in 5% intervals of the 67-82 % of the R-R cycle. Diastolic phases were analyzed on a dedicated work station using MPR, MIP and VRT modes. The patient received 200 cc of contrast- due to patient breathing artifact affecting RCA, repeat scan with breath hold. Artifact resolves on second scan.  Coronary Arteries:  Normal coronary origin.  Right dominance.  Coronary Calcium Score:  Left main: 0  Left anterior descending artery: 27  Left circumflex artery: 0  Right coronary artery: 0  Total: 27  Percentile: 73rd for age, sex, and race matched control.  Plaque Analysis:  Left main: 0  Left anterior descending artery: 1 cubic mm calcified plaque, 10 cubic mm non calcified plaque, no low attenuation plaque.  Left circumflex artery:9 cubic mm non calcified plaque, no low attenuation plaque.  Right coronary artery:  0  Total: 27  Percentile: 73rd for age, sex, and race matched control.  RCA is a large dominant artery that gives rise to PDA and PLA. There is no significant plaque.  Left main is a large artery that gives rise to LAD and LCX arteries. There is no significant plaque.  LAD is a large vessel that gives rise to one large D1 Branch that bifurcates. There is minimal non obstructive calcified plaque (1-24%) in the proximal LAD.  LCX is a non-dominant artery that gives rise to one large OM1 branch that bifurcates. There is no significant plaque.  Other findings:  Aorta: Normal size.  No calcifications.  No dissection.  Main Pulmonary Artery: Normal size of the pulmonary artery.  Aortic Valve:  Tri-leaflet.  No calcifications.  Normal pulmonary vein drainage into the left atrium.  Normal left atrial appendage without a thrombus.  Extra-cardiac findings: See attached radiology report for non-cardiac structures.  IMPRESSION: 1. Coronary calcium score of 27. This was 73rd percentile for age, sex, and race matched control.  2. Normal coronary origin with right dominance.  3. CAD-RADS 1. Minimal non-obstructive CAD (1-24%). Consider non-atherosclerotic causes of chest pain. Consider preventive therapy and risk factor modification.  RECOMMENDATIONS:  Coronary artery calcium (CAC) score is a strong predictor of incident coronary heart disease (CHD) and provides predictive information beyond traditional risk factors. CAC scoring is reasonable to use in the decision to withhold, postpone, or initiate statin therapy in intermediate-risk or selected borderline-risk asymptomatic adults (age 2-75 years and LDL-C >=70 to <190 mg/dL) who do not have diabetes or established atherosclerotic cardiovascular disease (ASCVD).* In intermediate-risk (10-year ASCVD risk >=7.5% to <20%) adults or selected borderline-risk (10-year ASCVD risk >=5% to <7.5%) adults in whom a CAC score is  measured for the purpose of making a treatment decision the following recommendations have been made:  If CAC = 0, it is reasonable to withhold statin therapy and reassess in 5 to 10 years, as long as higher risk conditions are absent (diabetes mellitus, family history of premature CHD in first degree relatives (males <55 years; females <65 years), cigarette smoking, LDL >=190 mg/dL or other independent risk factors).  If CAC is 1 to 99, it is reasonable to initiate statin therapy for patients >=66 years of age.  If CAC is >=100 or >=75th percentile, it is reasonable to initiate statin therapy at any age.  Cardiology referral should be considered for patients with CAC scores =400 or >=  75th percentile.  *2018 AHA/ACC/AACVPR/AAPA/ABC/ACPM/ADA/AGS/APhA/ASPC/NLA/PCNA Guideline on the Management of Blood Cholesterol: A Report of the American College of Cardiology/American Heart Association Task Force on Clinical Practice Guidelines. J Am Coll Cardiol. 2019;73(24):3168-3209.  Rudean Haskell, MD  Electronically Signed: By: Rudean Haskell M.D. On: 02/13/2022 19:19     Recent Labs: 05/19/2021: TSH 1.470 06/07/2021: ALT 16 11/23/2021: Hemoglobin 12.3; Magnesium 2.1; Platelets 324 01/06/2022: BUN 10; Creatinine, Ser 0.90; Potassium 3.7; Sodium 142  Recent Lipid Panel    Component Value Date/Time   CHOL 147 06/07/2021 1234   TRIG 92 06/07/2021 1234   HDL 60 06/07/2021 1234   CHOLHDL 2.5 06/07/2021 1234   LDLCALC 70 06/07/2021 1234       Physical Exam:    VS:  BP 116/72   Pulse 78   Ht 5' 5.5" (1.664 m)   Wt 157 lb (71.2 kg)   SpO2 98%   BMI 25.73 kg/m     Wt Readings from Last 3 Encounters:  03/31/22 157 lb (71.2 kg)  01/26/22 154 lb 9.6 oz (70.1 kg)  01/16/22 153 lb (69.4 kg)   Gen: No distress  Neck: No JVD Cardiac: No Rubs or Gallops, no murmur, regular rhythm , +2 radial pulses Respiratory: Clear to auscultation bilaterally, normal effort, normal   respiratory rate GI: Soft, nontender, non-distended  MS: No  edema;  moves all extremities Integument: Skin feels warm Neuro:  At time of evaluation, alert and oriented to person/place/time/situation  Psych: Normal affect, patient feels well  ASSESSMENT:    1. Hyperlipidemia LDL goal <70   2. Essential hypertension, benign   3. Coronary artery calcification   4. Esophageal dysmotility     PLAN:    Esophageal dysmotility - will start PPI  HTN - continue current regimen  CAC and HLD - continue current regimen for now - reviewed CT with patient - will get fasting lipids and ALT.  Six months with me in [place]; I have offered transfer to [facility]; she will let me know if she would like this        Medication Adjustments/Labs and Tests Ordered: Current medicines are reviewed at length with the patient today.  Concerns regarding medicines are outlined above.  Orders Placed This Encounter  Procedures   Lipid Profile   ALT   Meds ordered this encounter  Medications   pantoprazole (PROTONIX) 40 MG tablet    Sig: Take 1 tablet (40 mg total) by mouth daily.    Dispense:  90 tablet    Refill:  3       Signed, Werner Lean, MD  03/31/2022 2:26 PM    [place]

## 2022-03-31 ENCOUNTER — Ambulatory Visit: Payer: Medicare Other | Attending: Internal Medicine | Admitting: Internal Medicine

## 2022-03-31 ENCOUNTER — Encounter: Payer: Self-pay | Admitting: Internal Medicine

## 2022-03-31 VITALS — BP 116/72 | HR 78 | Ht 65.5 in | Wt 157.0 lb

## 2022-03-31 DIAGNOSIS — K224 Dyskinesia of esophagus: Secondary | ICD-10-CM | POA: Insufficient documentation

## 2022-03-31 DIAGNOSIS — E785 Hyperlipidemia, unspecified: Secondary | ICD-10-CM

## 2022-03-31 DIAGNOSIS — I10 Essential (primary) hypertension: Secondary | ICD-10-CM

## 2022-03-31 DIAGNOSIS — I2510 Atherosclerotic heart disease of native coronary artery without angina pectoris: Secondary | ICD-10-CM | POA: Insufficient documentation

## 2022-03-31 DIAGNOSIS — I2584 Coronary atherosclerosis due to calcified coronary lesion: Secondary | ICD-10-CM

## 2022-03-31 MED ORDER — PANTOPRAZOLE SODIUM 40 MG PO TBEC: 40.0000 mg | DELAYED_RELEASE_TABLET | Freq: Every day | ORAL | 3 refills | Status: DC

## 2022-03-31 NOTE — Patient Instructions (Signed)
Medication Instructions:    START Protonix 40 mg daily   Labwork: Lipid, ALT due before December   Testing/Procedures: None today  Follow-Up: 6 months  Any Other Special Instructions Will Be Listed Below (If Applicable).  If you need a refill on your cardiac medications before your next appointment, please call your pharmacy.

## 2022-04-18 ENCOUNTER — Encounter: Payer: Self-pay | Admitting: Internal Medicine

## 2022-04-18 ENCOUNTER — Ambulatory Visit: Payer: Medicare Other | Admitting: Internal Medicine

## 2022-04-18 VITALS — BP 120/70 | HR 77 | Temp 98.3°F | Ht 63.6 in | Wt 151.6 lb

## 2022-04-18 DIAGNOSIS — I10 Essential (primary) hypertension: Secondary | ICD-10-CM | POA: Diagnosis not present

## 2022-04-18 DIAGNOSIS — Z6826 Body mass index (BMI) 26.0-26.9, adult: Secondary | ICD-10-CM

## 2022-04-18 DIAGNOSIS — Z2821 Immunization not carried out because of patient refusal: Secondary | ICD-10-CM

## 2022-04-18 DIAGNOSIS — E559 Vitamin D deficiency, unspecified: Secondary | ICD-10-CM | POA: Diagnosis not present

## 2022-04-18 DIAGNOSIS — L309 Dermatitis, unspecified: Secondary | ICD-10-CM

## 2022-04-18 DIAGNOSIS — E7800 Pure hypercholesterolemia, unspecified: Secondary | ICD-10-CM | POA: Diagnosis not present

## 2022-04-18 NOTE — Progress Notes (Signed)
Sierra Glover,acting as a Education administrator for Sierra Greenland, MD.,have documented all relevant documentation on the behalf of Sierra Greenland, MD,as directed by  Sierra Greenland, MD while in the presence of Sierra Greenland, MD.    Subjective:     Patient ID: Sierra Glover , female    DOB: Feb 06, 1956 , 66 y.o.   MRN: [id number]   Chief Complaint  Patient presents with   Hypertension    HPI  Patient presents today for a bp check. Patient reports compliance with her meds. She denies having headaches, shortness of breath and chest pain. She has had a full cardiac workup as a result of palpitations/intermittent cp. Coronary CT revealed minimal non-obstructive CAD and patulous esophagus.  She had a negative stress test.   Lastly, she had a normal heart monitor.  Cardiology input appreciated.   Patient is also concerned about some spots on her lower legs.  She denies seeing any blisters.   Hypertension This is a chronic problem. The current episode started more than 1 year ago. The problem has been gradually improving since onset. The problem is controlled. Pertinent negatives include no blurred vision. Past treatments include diuretics. The current treatment provides moderate improvement.     Past Medical History:  Diagnosis Date   Anemia    before my hysterectomy   Dizziness    DVT (deep venous thrombosis) (HCC)    "Many, many years ago."   HTN (hypertension)    Nausea    Palpitation    Serum calcium elevated      Family History  Problem Relation Age of Onset   Alzheimer's disease Mother    Hypertension Mother    Hypertension Father    COPD Father    Hypertension Other      Current Outpatient Medications:    amLODipine (NORVASC) 10 MG tablet, Take 1 tablet (10 mg total) by mouth daily., Disp: 90 tablet, Rfl: 3   atorvastatin (LIPITOR) 20 MG tablet, TAKE 1 TABLET BY MOUTH EVERY DAY, Disp: 90 tablet, Rfl: 3   B Complex Vitamins (VITAMIN B COMPLEX PO), Take by mouth.,  Disp: , Rfl:    ciclopirox (PENLAC) 8 % solution, Apply topically at bedtime. Apply over nail and surrounding skin. Apply daily over previous coat. After seven (7) days, may remove with alcohol and continue cycle., Disp: 6.6 mL, Rfl: 0   diazepam (VALIUM) 2 MG tablet, One tab po 1hr prior to procedure, repeat if needed, Disp: 2 tablet, Rfl: 0   fluticasone (FLONASE) 50 MCG/ACT nasal spray, Place 2 sprays into both nostrils daily as needed for allergies or rhinitis., Disp: 11.1 mL, Rfl: 5   loratadine (CLARITIN) 10 MG tablet, Take 1 tablet (10 mg total) by mouth daily., Disp: 30 tablet, Rfl: 2   meclizine (ANTIVERT) 25 MG tablet, Take 1 tablet (25 mg total) by mouth 2 (two) times daily as needed for dizziness., Disp: 30 tablet, Rfl: 0   ondansetron (ZOFRAN ODT) 8 MG disintegrating tablet, Take 1 tablet (8 mg total) by mouth every 8 (eight) hours as needed for nausea or vomiting., Disp: 20 tablet, Rfl: 0   pantoprazole (PROTONIX) 40 MG tablet, Take 1 tablet (40 mg total) by mouth daily., Disp: 90 tablet, Rfl: 3   valACYclovir (VALTREX) 500 MG tablet, take 1 tablet by oral route  every day (Patient taking differently: as needed. take 1 tablet by oral route  every day), Disp: 90 tablet, Rfl: 0   valsartan-hydrochlorothiazide (DIOVAN-HCT) 160-12.5 MG tablet,  TAKE 1 TABLET BY MOUTH EVERY DAY, Disp: 90 tablet, Rfl: 1   Zinc Oxide-Vitamin C (ZINC PLUS VITAMIN C PO), Take by mouth., Disp: , Rfl:    potassium chloride SA (KLOR-CON M) 20 MEQ tablet, Take 1 3 times a day for 2 days then twice daily. (Patient not taking: Reported on 04/18/2022), Disp: 50 tablet, Rfl: 0   No Known Allergies   Review of Systems  Constitutional: Negative.   Eyes: Negative.  Negative for blurred vision.  Respiratory: Negative.    Cardiovascular: Negative.   Musculoskeletal: Negative.   Skin:  Positive for rash.       She c/o dark splotches on the back of her right leg, not itchy. She is not sure what is triggering her sx.    Neurological: Negative.   Psychiatric/Behavioral: Negative.       Today's Vitals   04/18/22 1027  BP: 120/70  Pulse: 77  Temp: 98.3 F (36.8 C)  Weight: 151 lb 9.6 oz (68.8 kg)  Height: 5' 3.6" (1.615 m)  PainSc: 0-No pain   Body mass index is 26.35 kg/m.  Wt Readings from Last 3 Encounters:  04/18/22 151 lb 9.6 oz (68.8 kg)  03/31/22 157 lb (71.2 kg)  01/26/22 154 lb 9.6 oz (70.1 kg)     Objective:  Physical Exam Vitals and nursing note reviewed.  Constitutional:      Appearance: Normal appearance.  HENT:     Head: Normocephalic and atraumatic.     Nose:     Comments: Masked     Mouth/Throat:     Comments: Masked  Eyes:     Extraocular Movements: Extraocular movements intact.  Cardiovascular:     Rate and Rhythm: Normal rate and regular rhythm.     Heart sounds: Normal heart sounds.  Pulmonary:     Effort: Pulmonary effort is normal.     Breath sounds: Normal breath sounds.  Musculoskeletal:     Cervical back: Normal range of motion.  Skin:    General: Skin is warm.     Comments: Hyperpigmented splotches on LE   Neurological:     General: No focal deficit present.     Mental Status: She is alert.  Psychiatric:        Mood and Affect: Mood normal.        Behavior: Behavior normal.       Assessment And Plan:     1. Essential hypertension, benign Comments: Chronic, well controlled. She will c/w amlodipine 3m and valsartan/hct 160/12.570mdaily. She is encouraged to follow low sodium diet. - CMP14+EGFR - Lipid panel  2. Pure hypercholesterolemia Comments: Chronic, now on statin therapy. She will c/w atorvastatin 2075maily. - Lipid panel  3. Vitamin D deficiency disease Comments: I will check vitamin D level and supplement as needed. - Vitamin D (25 hydroxy)  4. Dermatitis Comments: May benefit from low dose steroid cream. If persistent, she may consult with Derm.  5. BMI 26.0-26.9,adult Comments: She is encouraged to aim for at least 150  minutes of exercise per week.  6. Herpes zoster vaccination declined  7. Influenza vaccination declined   Patient was given opportunity to ask questions. Patient verbalized understanding of the plan and was able to repeat key elements of the plan. All questions were answered to their satisfaction.   I, RobMaximino GreenlandD, have reviewed all documentation for this visit. The documentation on 04/18/22 for the exam, diagnosis, procedures, and orders are all accurate and complete.  IF YOU HAVE BEEN REFERRED TO A SPECIALIST, IT MAY TAKE 1-2 WEEKS TO SCHEDULE/PROCESS THE REFERRAL. IF YOU HAVE NOT HEARD FROM US/SPECIALIST IN TWO WEEKS, PLEASE GIVE [place] A CALL AT [phone].   THE PATIENT IS ENCOURAGED TO PRACTICE SOCIAL DISTANCING DUE TO THE COVID-19 PANDEMIC.

## 2022-04-18 NOTE — Patient Instructions (Signed)
Hypertension, Adult ?Hypertension is another name for high blood pressure. High blood pressure forces your heart to work harder to pump blood. This can cause problems over time. ?There are two numbers in a blood pressure reading. There is a top number (systolic) over a bottom number (diastolic). It is best to have a blood pressure that is below 120/80. ?What are the causes? ?The cause of this condition is not known. Some other conditions can lead to high blood pressure. ?What increases the risk? ?Some lifestyle factors can make you more likely to develop high blood pressure: ?Smoking. ?Not getting enough exercise or physical activity. ?Being overweight. ?Having too much fat, sugar, calories, or salt (sodium) in your diet. ?Drinking too much alcohol. ?Other risk factors include: ?Having any of these conditions: ?Heart disease. ?Diabetes. ?High cholesterol. ?Kidney disease. ?Obstructive sleep apnea. ?Having a family history of high blood pressure and high cholesterol. ?Age. The risk increases with age. ?Stress. ?What are the signs or symptoms? ?High blood pressure may not cause symptoms. Very high blood pressure (hypertensive crisis) may cause: ?Headache. ?Fast or uneven heartbeats (palpitations). ?Shortness of breath. ?Nosebleed. ?Vomiting or feeling like you may vomit (nauseous). ?Changes in how you see. ?Very bad chest pain. ?Feeling dizzy. ?Seizures. ?How is this treated? ?This condition is treated by making healthy lifestyle changes, such as: ?Eating healthy foods. ?Exercising more. ?Drinking less alcohol. ?Your doctor may prescribe medicine if lifestyle changes do not help enough and if: ?Your top number is above 130. ?Your bottom number is above 80. ?Your personal target blood pressure may vary. ?Follow these instructions at home: ?Eating and drinking ? ?If told, follow the DASH eating plan. To follow this plan: ?Fill one half of your plate at each meal with fruits and vegetables. ?Fill one fourth of your plate  at each meal with whole grains. Whole grains include whole-wheat pasta, brown rice, and whole-grain bread. ?Eat or drink low-fat dairy products, such as skim milk or low-fat yogurt. ?Fill one fourth of your plate at each meal with low-fat (lean) proteins. Low-fat proteins include fish, chicken without skin, eggs, beans, and tofu. ?Avoid fatty meat, cured and processed meat, or chicken with skin. ?Avoid pre-made or processed food. ?Limit the amount of salt in your diet to less than 1,500 mg each day. ?Do not drink alcohol if: ?Your doctor tells you not to drink. ?You are pregnant, may be pregnant, or are planning to become pregnant. ?If you drink alcohol: ?Limit how much you have to: ?0-1 drink a day for women. ?0-2 drinks a day for men. ?Know how much alcohol is in your drink. In the [place] one drink equals one 12 oz bottle of beer (355 mL), one 5 oz glass of wine (148 mL), or one 1? oz glass of hard liquor (44 mL). ?Lifestyle ? ?Work with your doctor to stay at a healthy weight or to lose weight. Ask your doctor what the best weight is for you. ?Get at least 30 minutes of exercise that causes your heart to beat faster (aerobic exercise) most days of the week. This may include walking, swimming, or biking. ?Get at least 30 minutes of exercise that strengthens your muscles (resistance exercise) at least 3 days a week. This may include lifting weights or doing Pilates. ?Do not smoke or use any products that contain nicotine or tobacco. If you need help quitting, ask your doctor. ?Check your blood pressure at home as told by your doctor. ?Keep all follow-up visits. ?Medicines ?Take over-the-counter and prescription medicines   only as told by your doctor. Follow directions carefully. ?Do not skip doses of blood pressure medicine. The medicine does not work as well if you skip doses. Skipping doses also puts you at risk for problems. ?Ask your doctor about side effects or reactions to medicines that you should watch  for. ?Contact a doctor if: ?You think you are having a reaction to the medicine you are taking. ?You have headaches that keep coming back. ?You feel dizzy. ?You have swelling in your ankles. ?You have trouble with your vision. ?Get help right away if: ?You get a very bad headache. ?You start to feel mixed up (confused). ?You feel weak or numb. ?You feel faint. ?You have very bad pain in your: ?Chest. ?Belly (abdomen). ?You vomit more than once. ?You have trouble breathing. ?These symptoms may be an emergency. Get help right away. Call 911. ?Do not wait to see if the symptoms will go away. ?Do not drive yourself to the hospital. ?Summary ?Hypertension is another name for high blood pressure. ?High blood pressure forces your heart to work harder to pump blood. ?For most people, a normal blood pressure is less than 120/80. ?Making healthy choices can help lower blood pressure. If your blood pressure does not get lower with healthy choices, you may need to take medicine. ?This information is not intended to replace advice given to you by your health care provider. Make sure you discuss any questions you have with your health care provider. ?Document Revised: 03/24/2021 Document Reviewed: 03/24/2021 ?Elsevier Patient Education ? 2023 Elsevier Inc. ? ?

## 2022-04-19 ENCOUNTER — Encounter: Payer: Self-pay | Admitting: Internal Medicine

## 2022-04-19 LAB — CMP14+EGFR
ALT: 19 IU/L (ref 0–32)
AST: 27 IU/L (ref 0–40)
Albumin/Globulin Ratio: 1.7 (ref 1.2–2.2)
Albumin: 4.7 g/dL (ref 3.9–4.9)
Alkaline Phosphatase: 113 IU/L (ref 44–121)
BUN/Creatinine Ratio: 7 — ABNORMAL LOW (ref 12–28)
BUN: 6 mg/dL — ABNORMAL LOW (ref 8–27)
Bilirubin Total: 0.9 mg/dL (ref 0.0–1.2)
CO2: 27 mmol/L (ref 20–29)
Calcium: 10.6 mg/dL — ABNORMAL HIGH (ref 8.7–10.3)
Chloride: 99 mmol/L (ref 96–106)
Creatinine, Ser: 0.85 mg/dL (ref 0.57–1.00)
Globulin, Total: 2.8 g/dL (ref 1.5–4.5)
Glucose: 90 mg/dL (ref 70–99)
Potassium: 3.5 mmol/L (ref 3.5–5.2)
Sodium: 141 mmol/L (ref 134–144)
Total Protein: 7.5 g/dL (ref 6.0–8.5)
eGFR: 76 mL/min/{1.73_m2} (ref >59)

## 2022-04-19 LAB — LIPID PANEL
Chol/HDL Ratio: 2.7 ratio (ref 0.0–4.4)
Cholesterol, Total: 145 mg/dL (ref 100–199)
HDL: 53 mg/dL (ref >39)
LDL Chol Calc (NIH): 74 mg/dL (ref 0–99)
Triglycerides: 95 mg/dL (ref 0–149)
VLDL Cholesterol Cal: 18 mg/dL (ref 5–40)

## 2022-04-19 LAB — VITAMIN D 25 HYDROXY (VIT D DEFICIENCY, FRACTURES): Vit D, 25-Hydroxy: 31.4 ng/mL (ref 30.0–100.0)

## 2022-04-20 ENCOUNTER — Encounter: Payer: Self-pay | Admitting: Internal Medicine

## 2022-04-29 ENCOUNTER — Other Ambulatory Visit: Payer: Self-pay | Admitting: Internal Medicine

## 2022-05-26 ENCOUNTER — Other Ambulatory Visit: Payer: Self-pay | Admitting: Internal Medicine

## 2022-05-26 DIAGNOSIS — I10 Essential (primary) hypertension: Secondary | ICD-10-CM

## 2022-05-29 ENCOUNTER — Encounter: Payer: Self-pay | Admitting: Internal Medicine

## 2022-05-30 ENCOUNTER — Ambulatory Visit: Payer: Medicare Other

## 2022-05-30 VITALS — BP 142/78 | HR 98 | Temp 98.1°F

## 2022-05-30 DIAGNOSIS — I10 Essential (primary) hypertension: Secondary | ICD-10-CM

## 2022-05-30 MED ORDER — HYDROCHLOROTHIAZIDE 12.5 MG PO TABS: 12.5000 mg | ORAL_TABLET | Freq: Every day | ORAL | 1 refills | Status: DC

## 2022-05-30 NOTE — Progress Notes (Signed)
Patient presents today for BPC. She is currently taking Amlodipine 10mg  and Valsartan- hctz 160-12.5mg    BP Readings from Last 3 Encounters:  05/30/22 (!) 142/78  04/18/22 120/70  03/31/22 116/72   Her personal cuff 151/85  Provider would like to start hctz 12.5mg  in the morning with valsartan/hctz 160/12.5mg . patient will see provider in 3 weeks.

## 2022-06-06 ENCOUNTER — Ambulatory Visit: Payer: Medicare Other

## 2022-06-06 ENCOUNTER — Encounter: Payer: Self-pay | Admitting: Internal Medicine

## 2022-06-06 VITALS — BP 118/78 | HR 80 | Temp 98.5°F

## 2022-06-06 DIAGNOSIS — Z79899 Other long term (current) drug therapy: Secondary | ICD-10-CM

## 2022-06-06 DIAGNOSIS — I10 Essential (primary) hypertension: Secondary | ICD-10-CM

## 2022-06-06 NOTE — Progress Notes (Signed)
Patient presents today for BPC. She is currently taking Amlodipine 10mg  HCTZ 12.5 and Valsartan/HCTZ 160/12.5.  BP Readings from Last 3 Encounters:  06/06/22 118/78  05/30/22 (!) 142/78  04/18/22 120/70   bp is much better. she is at goal. if she continues to have problems, needs to see cardiologist. she can get off of extra hct if she exercises at least five days per week. stay hydrated. if she doesn't increase water intake, will have to stop and add a different medication. Patient will get BMP today.

## 2022-06-07 LAB — BMP8+EGFR
BUN/Creatinine Ratio: 11 — ABNORMAL LOW (ref 12–28)
BUN: 9 mg/dL (ref 8–27)
CO2: 28 mmol/L (ref 20–29)
Calcium: 10.3 mg/dL (ref 8.7–10.3)
Chloride: 95 mmol/L — ABNORMAL LOW (ref 96–106)
Creatinine, Ser: 0.8 mg/dL (ref 0.57–1.00)
Glucose: 78 mg/dL (ref 70–99)
Potassium: 3.7 mmol/L (ref 3.5–5.2)
Sodium: 139 mmol/L (ref 134–144)
eGFR: 81 mL/min/{1.73_m2} (ref >59)

## 2022-06-20 ENCOUNTER — Ambulatory Visit: Payer: Medicare Other | Admitting: Internal Medicine

## 2022-06-20 ENCOUNTER — Encounter: Payer: Self-pay | Admitting: Internal Medicine

## 2022-06-20 VITALS — BP 122/68 | HR 78 | Temp 98.5°F | Ht 63.6 in | Wt 157.0 lb

## 2022-06-20 DIAGNOSIS — H8113 Benign paroxysmal vertigo, bilateral: Secondary | ICD-10-CM

## 2022-06-20 DIAGNOSIS — R42 Dizziness and giddiness: Secondary | ICD-10-CM | POA: Diagnosis not present

## 2022-06-20 DIAGNOSIS — I10 Essential (primary) hypertension: Secondary | ICD-10-CM | POA: Diagnosis not present

## 2022-06-20 MED ORDER — MECLIZINE HCL 25 MG PO TABS: 25.0000 mg | ORAL_TABLET | Freq: Two times a day (BID) | ORAL | 0 refills | Status: DC | PRN

## 2022-06-20 NOTE — Patient Instructions (Signed)
Hypertension, Adult High blood pressure (hypertension) is when the force of blood pumping through the arteries is too strong. The arteries are the blood vessels that carry blood from the heart throughout the body. Hypertension forces the heart to work harder to pump blood and may cause arteries to become narrow or stiff. Untreated or uncontrolled hypertension can lead to a heart attack, heart failure, a stroke, kidney disease, and other problems. A blood pressure reading consists of a higher number over a lower number. Ideally, your blood pressure should be below 120/80. The first ("top") number is called the systolic pressure. It is a measure of the pressure in your arteries as your heart beats. The second ("bottom") number is called the diastolic pressure. It is a measure of the pressure in your arteries as the heart relaxes. What are the causes? The exact cause of this condition is not known. There are some conditions that result in high blood pressure. What increases the risk? Certain factors may make you more likely to develop high blood pressure. Some of these risk factors are under your control, including: Smoking. Not getting enough exercise or physical activity. Being overweight. Having too much fat, sugar, calories, or salt (sodium) in your diet. Drinking too much alcohol. Other risk factors include: Having a personal history of heart disease, diabetes, high cholesterol, or kidney disease. Stress. Having a family history of high blood pressure and high cholesterol. Having obstructive sleep apnea. Age. The risk increases with age. What are the signs or symptoms? High blood pressure may not cause symptoms. Very high blood pressure (hypertensive crisis) may cause: Headache. Fast or irregular heartbeats (palpitations). Shortness of breath. Nosebleed. Nausea and vomiting. Vision changes. Severe chest pain, dizziness, and seizures. How is this diagnosed? This condition is diagnosed by  measuring your blood pressure while you are seated, with your arm resting on a flat surface, your legs uncrossed, and your feet flat on the floor. The cuff of the blood pressure monitor will be placed directly against the skin of your upper arm at the level of your heart. Blood pressure should be measured at least twice using the same arm. Certain conditions can cause a difference in blood pressure between your right and left arms. If you have a high blood pressure reading during one visit or you have normal blood pressure with other risk factors, you may be asked to: Return on a different day to have your blood pressure checked again. Monitor your blood pressure at home for 1 week or longer. If you are diagnosed with hypertension, you may have other blood or imaging tests to help your health care provider understand your overall risk for other conditions. How is this treated? This condition is treated by making healthy lifestyle changes, such as eating healthy foods, exercising more, and reducing your alcohol intake. You may be referred for counseling on a healthy diet and physical activity. Your health care provider may prescribe medicine if lifestyle changes are not enough to get your blood pressure under control and if: Your systolic blood pressure is above 130. Your diastolic blood pressure is above 80. Your personal target blood pressure may vary depending on your medical conditions, your age, and other factors. Follow these instructions at home: Eating and drinking  Eat a diet that is high in fiber and potassium, and low in sodium, added sugar, and fat. An example of this eating plan is called the DASH diet. DASH stands for Dietary Approaches to Stop Hypertension. To eat this way: Eat   plenty of fresh fruits and vegetables. Try to fill one half of your plate at each meal with fruits and vegetables. Eat whole grains, such as whole-wheat pasta, brown rice, or whole-grain bread. Fill about one  fourth of your plate with whole grains. Eat or drink low-fat dairy products, such as skim milk or low-fat yogurt. Avoid fatty cuts of meat, processed or cured meats, and poultry with skin. Fill about one fourth of your plate with lean proteins, such as fish, chicken without skin, beans, eggs, or tofu. Avoid pre-made and processed foods. These tend to be higher in sodium, added sugar, and fat. Reduce your daily sodium intake. Many people with hypertension should eat less than 1,500 mg of sodium a day. Do not drink alcohol if: Your health care provider tells you not to drink. You are pregnant, may be pregnant, or are planning to become pregnant. If you drink alcohol: Limit how much you have to: 0-1 drink a day for women. 0-2 drinks a day for men. Know how much alcohol is in your drink. In the [place] one drink equals one 12 oz bottle of beer (355 mL), one 5 oz glass of wine (148 mL), or one 1 oz glass of hard liquor (44 mL). Lifestyle  Work with your health care provider to maintain a healthy body weight or to lose weight. Ask what an ideal weight is for you. Get at least 30 minutes of exercise that causes your heart to beat faster (aerobic exercise) most days of the week. Activities may include walking, swimming, or biking. Include exercise to strengthen your muscles (resistance exercise), such as Pilates or lifting weights, as part of your weekly exercise routine. Try to do these types of exercises for 30 minutes at least 3 days a week. Do not use any products that contain nicotine or tobacco. These products include cigarettes, chewing tobacco, and vaping devices, such as e-cigarettes. If you need help quitting, ask your health care provider. Monitor your blood pressure at home as told by your health care provider. Keep all follow-up visits. This is important. Medicines Take over-the-counter and prescription medicines only as told by your health care provider. Follow directions carefully. Blood  pressure medicines must be taken as prescribed. Do not skip doses of blood pressure medicine. Doing this puts you at risk for problems and can make the medicine less effective. Ask your health care provider about side effects or reactions to medicines that you should watch for. Contact a health care provider if you: Think you are having a reaction to a medicine you are taking. Have headaches that keep coming back (recurring). Feel dizzy. Have swelling in your ankles. Have trouble with your vision. Get help right away if you: Develop a severe headache or confusion. Have unusual weakness or numbness. Feel faint. Have severe pain in your chest or abdomen. Vomit repeatedly. Have trouble breathing. These symptoms may be an emergency. Get help right away. Call 911. Do not wait to see if the symptoms will go away. Do not drive yourself to the hospital. Summary Hypertension is when the force of blood pumping through your arteries is too strong. If this condition is not controlled, it may put you at risk for serious complications. Your personal target blood pressure may vary depending on your medical conditions, your age, and other factors. For most people, a normal blood pressure is less than 120/80. Hypertension is treated with lifestyle changes, medicines, or a combination of both. Lifestyle changes include losing weight, eating a healthy,   low-sodium diet, exercising more, and limiting alcohol. This information is not intended to replace advice given to you by your health care provider. Make sure you discuss any questions you have with your health care provider. Document Revised: 04/12/2021 Document Reviewed: 04/12/2021 Elsevier Patient Education  2023 [employer]  

## 2022-06-20 NOTE — Progress Notes (Signed)
I,Tianna Badgett,acting as a Education administrator for [name], MD.,have documented all relevant documentation on the behalf of [name], MD,as directed by  [name], MD while in the presence of [name], MD.  Subjective:     Patient ID: Sierra Glover , female    DOB: [birth year] , 67 y.o.   MRN: [id number]   Chief Complaint  Patient presents with   Hypertension    HPI  Patient presents today for a bp check. Patient reports compliance with her meds. She was most recently started on add'l HCTZ 12.5mg  added to her valsartan/hctz 160/12.5mg  and amlodipine 10mg  daily. She stopped the additional hctz on 12/28 because she had been feeling lightheaded. She denies having headaches, shortness of breath and chest pain. She adds that she has also increased her fluid intake.   Hypertension This is a chronic problem. The current episode started more than 1 year ago. The problem has been gradually improving since onset. The problem is controlled. Pertinent negatives include no blurred vision. Past treatments include diuretics. The current treatment provides moderate improvement.     Past Medical History:  Diagnosis Date   Anemia    before my hysterectomy   Dizziness    DVT (deep venous thrombosis) (HCC)    "Many, many years ago."   HTN (hypertension)    Nausea    Palpitation    Serum calcium elevated      Family History  Problem Relation Age of Onset   Alzheimer's disease Mother    Hypertension Mother    Hypertension Father    COPD Father    Hypertension Other      Current Outpatient Medications:    amLODipine (NORVASC) 10 MG tablet, Take 1 tablet (10 mg total) by mouth daily., Disp: 90 tablet, Rfl: 3   atorvastatin (LIPITOR) 20 MG tablet, TAKE 1 TABLET BY MOUTH EVERY DAY, Disp: 90 tablet, Rfl: 3   B Complex Vitamins (VITAMIN B COMPLEX PO), Take by mouth., Disp: , Rfl:    ciclopirox (PENLAC) 8 % solution, Apply topically at bedtime. Apply over nail and surrounding  skin. Apply daily over previous coat. After seven (7) days, may remove with alcohol and continue cycle., Disp: 6.6 mL, Rfl: 0   diazepam (VALIUM) 2 MG tablet, One tab po 1hr prior to procedure, repeat if needed, Disp: 2 tablet, Rfl: 0   fluticasone (FLONASE) 50 MCG/ACT nasal spray, PLACE 2 SPRAYS INTO BOTH NOSTRILS DAILY AS NEEDED FOR ALLERGIES OR RHINITIS., Disp: 48 mL, Rfl: 1   hydrochlorothiazide (HYDRODIURIL) 12.5 MG tablet, Take 1 tablet (12.5 mg total) by mouth daily., Disp: 30 tablet, Rfl: 1   loratadine (CLARITIN) 10 MG tablet, Take 1 tablet (10 mg total) by mouth daily., Disp: 30 tablet, Rfl: 2   ondansetron (ZOFRAN ODT) 8 MG disintegrating tablet, Take 1 tablet (8 mg total) by mouth every 8 (eight) hours as needed for nausea or vomiting., Disp: 20 tablet, Rfl: 0   pantoprazole (PROTONIX) 40 MG tablet, Take 1 tablet (40 mg total) by mouth daily., Disp: 90 tablet, Rfl: 3   valACYclovir (VALTREX) 500 MG tablet, take 1 tablet by oral route  every day (Patient taking differently: as needed. take 1 tablet by oral route  every day), Disp: 90 tablet, Rfl: 0   valsartan-hydrochlorothiazide (DIOVAN-HCT) 160-12.5 MG tablet, TAKE 1 TABLET BY MOUTH EVERY DAY, Disp: 90 tablet, Rfl: 1   Zinc Oxide-Vitamin C (ZINC PLUS VITAMIN C PO), Take by mouth., Disp: , Rfl:  meclizine (ANTIVERT) 25 MG tablet, Take 1 tablet (25 mg total) by mouth 2 (two) times daily as needed for dizziness., Disp: 30 tablet, Rfl: 0   No Known Allergies   Review of Systems  Constitutional: Negative.   Eyes:  Negative for blurred vision.  Respiratory: Negative.    Cardiovascular: Negative.   Gastrointestinal: Negative.   Neurological: Negative.      Today's Vitals   06/20/22 1514  BP: 122/68  Pulse: 78  Temp: 98.5 F (36.9 C)  TempSrc: Oral  Weight: 157 lb (71.2 kg)  Height: 5' 3.6" (1.615 m)   Body mass index is 27.29 kg/m.  BP Readings from Last 3 Encounters:  06/20/22 122/68  06/06/22 118/78  05/30/22 (!)  142/78    Objective:  Physical Exam Vitals and nursing note reviewed.  Constitutional:      Appearance: Normal appearance.  HENT:     Head: Normocephalic and atraumatic.     Nose:     Comments: Masked     Mouth/Throat:     Comments: Masked  Eyes:     Extraocular Movements: Extraocular movements intact.  Cardiovascular:     Rate and Rhythm: Normal rate and regular rhythm.     Heart sounds: Normal heart sounds.  Pulmonary:     Effort: Pulmonary effort is normal.     Breath sounds: Normal breath sounds.  Musculoskeletal:     Cervical back: Normal range of motion.  Skin:    General: Skin is warm.  Neurological:     General: No focal deficit present.     Mental Status: She is alert.  Psychiatric:        Mood and Affect: Mood normal.        Behavior: Behavior normal.         Assessment And Plan:     1. Essential hypertension, benign Comments: Chronic, improved. She did not tolerate add'l hctz. She will continue to keep BP log 3-4x/week.  She will c/w valsartan/hctz and amlodipine. F/u w/in 3 months.  2. Vertigo Comments: She requested refill of meclizine. Refill sent. She is not having any current sx. - meclizine (ANTIVERT) 25 MG tablet; Take 1 tablet (25 mg total) by mouth 2 (two) times daily as needed for dizziness.  Dispense: 30 tablet; Refill: 0   Patient was given opportunity to ask questions. Patient verbalized understanding of the plan and was able to repeat key elements of the plan. All questions were answered to their satisfaction.   I, [name], MD, have reviewed all documentation for this visit. The documentation on 06/20/22 for the exam, diagnosis, procedures, and orders are all accurate and complete.   IF YOU HAVE BEEN REFERRED TO A SPECIALIST, IT MAY TAKE 1-2 WEEKS TO SCHEDULE/PROCESS THE REFERRAL. IF YOU HAVE NOT HEARD FROM US/SPECIALIST IN TWO WEEKS, PLEASE GIVE [place] A CALL AT [phone] X [phone].   THE PATIENT IS ENCOURAGED TO PRACTICE SOCIAL  DISTANCING DUE TO THE COVID-19 PANDEMIC.

## 2022-06-28 ENCOUNTER — Ambulatory Visit: Payer: Medicare Other

## 2022-07-12 ENCOUNTER — Ambulatory Visit (INDEPENDENT_AMBULATORY_CARE_PROVIDER_SITE_OTHER): Payer: Medicare Other

## 2022-07-12 VITALS — Ht 66.0 in | Wt 151.0 lb

## 2022-07-12 DIAGNOSIS — Z0000 Encounter for general adult medical examination without abnormal findings: Secondary | ICD-10-CM | POA: Diagnosis not present

## 2022-07-12 NOTE — Progress Notes (Signed)
I connected with  Rhett Bannister today via telehealth video enabled device and verified that I am speaking with the correct person using two identifiers.   Location: Patient: home Provider: work  Persons participating in virtual visit: [name], Glenna Durand LPN  I discussed the limitations, risks, security and privacy concerns of performing an evaluation and management service by video and the availability of in person appointments. The patient expressed understanding and agreed to proceed.   Some vital signs may be absent or patient reported.     Subjective:   Sierra Glover is a 67 y.o. female who presents for an Initial Medicare Annual Wellness Visit.  Review of Systems     Cardiac Risk Factors include: advanced age (>40men, >34 women);hypertension     Objective:    Today's Vitals   07/12/22 0817  Weight: 151 lb (68.5 kg)  Height: 5\' 6"  (1.676 m)   Body mass index is 24.37 kg/m.     07/12/2022    8:22 AM 11/23/2021    2:54 PM 06/07/2021   12:03 PM 05/22/2021   12:50 PM 01/07/2021    6:35 AM 01/01/2021   10:33 AM  Advanced Directives  Does Patient Have a Medical Advance Directive? No No No No No No  Would patient like information on creating a medical advance directive?  No - Patient declined Yes (MAU/Ambulatory/Procedural Areas - Information given)  No - Patient declined No - Patient declined    Current Medications (verified) Outpatient Encounter Medications as of 07/12/2022  Medication Sig   amLODipine (NORVASC) 10 MG tablet Take 1 tablet (10 mg total) by mouth daily.   atorvastatin (LIPITOR) 20 MG tablet TAKE 1 TABLET BY MOUTH EVERY DAY   B Complex Vitamins (VITAMIN B COMPLEX PO) Take by mouth.   ciclopirox (PENLAC) 8 % solution Apply topically at bedtime. Apply over nail and surrounding skin. Apply daily over previous coat. After seven (7) days, may remove with alcohol and continue cycle.   diazepam (VALIUM) 2 MG tablet One tab po 1hr prior to  procedure, repeat if needed   fluticasone (FLONASE) 50 MCG/ACT nasal spray PLACE 2 SPRAYS INTO BOTH NOSTRILS DAILY AS NEEDED FOR ALLERGIES OR RHINITIS.   hydrochlorothiazide (HYDRODIURIL) 12.5 MG tablet Take 1 tablet (12.5 mg total) by mouth daily.   meclizine (ANTIVERT) 25 MG tablet Take 1 tablet (25 mg total) by mouth 2 (two) times daily as needed for dizziness.   ondansetron (ZOFRAN ODT) 8 MG disintegrating tablet Take 1 tablet (8 mg total) by mouth every 8 (eight) hours as needed for nausea or vomiting.   pantoprazole (PROTONIX) 40 MG tablet Take 1 tablet (40 mg total) by mouth daily.   valACYclovir (VALTREX) 500 MG tablet take 1 tablet by oral route  every day (Patient taking differently: as needed. take 1 tablet by oral route  every day)   valsartan-hydrochlorothiazide (DIOVAN-HCT) 160-12.5 MG tablet TAKE 1 TABLET BY MOUTH EVERY DAY   Zinc Oxide-Vitamin C (ZINC PLUS VITAMIN C PO) Take by mouth.   loratadine (CLARITIN) 10 MG tablet Take 1 tablet (10 mg total) by mouth daily.   No facility-administered encounter medications on file as of 07/12/2022.    Allergies (verified) Patient has no known allergies.   History: Past Medical History:  Diagnosis Date   Anemia    before my hysterectomy   Dizziness    DVT (deep venous thrombosis) (HCC)    "Many, many years ago."   HTN (hypertension)    Nausea  Palpitation    Serum calcium elevated    Past Surgical History:  Procedure Laterality Date   COLONOSCOPY     partal hysterectomy  2009   TIBIA IM NAIL INSERTION Right 01/07/2021   Procedure: INTRAMEDULLARY (IM) NAIL TIBIAL;  Surgeon: Roby Lofts, MD;  Location: [facility] OR;  Service: Orthopedics;  Laterality: Right;   Family History  Problem Relation Age of Onset   Alzheimer's disease Mother    Hypertension Mother    Hypertension Father    COPD Father    Hypertension Other    Social History   Socioeconomic History   Marital status: Married    Spouse name: Not on file   Number  of children: Not on file   Years of education: Not on file   Highest education level: Not on file  Occupational History   Not on file  Tobacco Use   Smoking status: Never   Smokeless tobacco: Never   Tobacco comments:    N/a  Vaping Use   Vaping Use: Never used  Substance and Sexual Activity   Alcohol use: Not Currently    Comment: social   Drug use: Never   Sexual activity: Yes  Other Topics Concern   Not on file  Social History Narrative   Not on file   Social Determinants of Health   Financial Resource Strain: Low Risk  (07/12/2022)   Overall Financial Resource Strain (CARDIA)    Difficulty of Paying Living Expenses: Not hard at all  Food Insecurity: No Food Insecurity (07/12/2022)   Hunger Vital Sign    Worried About Running Out of Food in the Last Year: Never true    Ran Out of Food in the Last Year: Never true  Transportation Needs: No Transportation Needs (07/12/2022)   PRAPARE - Administrator, Civil Service (Medical): No    Lack of Transportation (Non-Medical): No  Physical Activity: Inactive (07/12/2022)   Exercise Vital Sign    Days of Exercise per Week: 0 days    Minutes of Exercise per Session: 0 min  Stress: No Stress Concern Present (07/12/2022)   [employer] of Occupational Health - Occupational Stress Questionnaire    Feeling of Stress : Not at all  Social Connections: Not on file    Tobacco Counseling Counseling given: Not Answered Tobacco comments: N/a   Clinical Intake:  Pre-visit preparation completed: Yes  Pain : No/denies pain     Nutritional Status: BMI of 19-24  Normal Nutritional Risks: None Diabetes: No  How often do you need to have someone help you when you read instructions, pamphlets, or other written materials from your doctor or pharmacy?: 1 - Never  Diabetic? no  Interpreter Needed?: No  Information entered by :: NAllen LPN   Activities of Daily Living    07/12/2022    8:23 AM 07/11/2022    6:58 PM   In your present state of health, do you have any difficulty performing the following activities:  Hearing? 0 0  Vision? 0 0  Difficulty concentrating or making decisions? 0 0  Walking or climbing stairs? 0 0  Dressing or bathing? 0 0  Doing errands, shopping? 0 0  Preparing Food and eating ? N N  Using the Toilet? N N  In the past six months, have you accidently leaked urine? N N  Do you have problems with loss of bowel control? N N  Managing your Medications? N N  Managing your Finances? N N  Housekeeping or  managing your Housekeeping? N N    Patient Care Team: Glendale Chard, MD as PCP - General (Internal Medicine) Werner Lean, MD as PCP - Cardiology (Cardiology)  Indicate any recent Medical Services you may have received from other than [facility] providers in the past year (date may be approximate).     Assessment:   This is a routine wellness examination for Jackalynn.  Hearing/Vision screen Vision Screening - Comments:: Regular eye exams, Dr. Precious Bard  Dietary issues and exercise activities discussed: Current Exercise Habits: The patient does not participate in regular exercise at present   Goals Addressed             This Visit's Progress    Patient Stated       07/12/2022, wants to lose weight, increase water intake       Depression Screen    07/12/2022    8:23 AM 06/20/2022    3:17 PM 05/19/2021   10:51 AM 11/13/2019    8:27 AM 11/06/2018    2:26 PM 05/08/2018   10:40 AM  PHQ 2/9 Scores  PHQ - 2 Score 0 0 0 0 0 0  PHQ- 9 Score   0       Fall Risk    07/12/2022    8:23 AM 07/11/2022    6:58 PM 06/20/2022    3:17 PM 06/07/2021   12:04 PM 05/19/2021   10:51 AM  [place] in the past year? 0 0 0 0 0  Number falls in past yr: 0  0 0 0  Injury with Fall? 0  0 0 0  Risk for fall due to : Medication side effect  No Fall Risks    Follow up Falls prevention discussed;Education provided;Falls evaluation completed  Falls evaluation completed       FALL RISK PREVENTION PERTAINING TO THE HOME:  Any stairs in or around the home? Yes  If so, are there any without handrails? No  Home free of loose throw rugs in walkways, pet beds, electrical cords, etc? Yes  Adequate lighting in your home to reduce risk of falls? Yes   ASSISTIVE DEVICES UTILIZED TO PREVENT FALLS:  Life alert? No  Use of a cane, walker or w/c? No  Grab bars in the bathroom? No  Shower chair or bench in shower? No  Elevated toilet seat or a handicapped toilet? Yes   TIMED UP AND GO:  Was the test performed? No .      Cognitive Function:        07/12/2022    8:23 AM 06/07/2021   11:30 AM  6CIT Screen  What Year? 0 points 0 points  What month? 0 points 0 points  What time? 0 points 0 points  Count back from 20 0 points 0 points  Months in reverse 0 points 0 points  Repeat phrase 0 points 0 points  Total Score 0 points 0 points    Immunizations Immunization History  Administered Date(s) Administered   Moderna Sars-Covid-2 Vaccination 08/20/2019, 09/24/2019   Td 01/16/2007   Tdap 05/08/2018    TDAP status: Up to date  Flu Vaccine status: Declined, Education has been provided regarding the importance of this vaccine but patient still declined. Advised may receive this vaccine at local pharmacy or Health Dept. Aware to provide a copy of the vaccination record if obtained from local pharmacy or Health Dept. Verbalized acceptance and understanding.  Pneumococcal vaccine status: Declined,  Education has been provided regarding the importance  of this vaccine but patient still declined. Advised may receive this vaccine at local pharmacy or Health Dept. Aware to provide a copy of the vaccination record if obtained from local pharmacy or Health Dept. Verbalized acceptance and understanding.   Covid-19 vaccine status: Completed vaccines  Qualifies for Shingles Vaccine? Yes   Zostavax completed No   Shingrix Completed?: No.    Education has been  provided regarding the importance of this vaccine. Patient has been advised to call insurance company to determine out of pocket expense if they have not yet received this vaccine. Advised may also receive vaccine at local pharmacy or Health Dept. Verbalized acceptance and understanding.  Screening Tests Health Maintenance  Topic Date Due   COVID-19 Vaccine (3 - Moderna risk series) 10/22/2019   Medicare Annual Wellness (AWV)  06/07/2022   Zoster Vaccines- Shingrix (1 of 2) 07/19/2022 (Originally 11/25/1974)   INFLUENZA VACCINE  09/17/2022 (Originally 01/17/2022)   Pneumonia Vaccine 32+ Years old (1 - PCV) 12/16/2022 (Originally 11/24/2020)   MAMMOGRAM  12/09/2023   COLONOSCOPY (Pts 45-14yrs Insurance coverage will need to be confirmed)  05/26/2026   DTaP/Tdap/Td (3 - Td or Tdap) 05/08/2028   DEXA SCAN  Completed   Hepatitis C Screening  Completed   HPV VACCINES  Aged Out    Health Maintenance  Health Maintenance Due  Topic Date Due   COVID-19 Vaccine (3 - Moderna risk series) 10/22/2019   Medicare Annual Wellness (AWV)  06/07/2022    Colorectal cancer screening: Type of screening: Colonoscopy. Completed 05/26/2016. Repeat every 10 years  Mammogram status: Completed 12/08/2021. Repeat every year  Bone Density status: Completed 12/08/2021.   Lung Cancer Screening: (Low Dose CT Chest recommended if Age 61-80 years, 30 pack-year currently smoking OR have quit w/in 15years.) does not qualify.   Lung Cancer Screening Referral: no  Additional Screening:  Hepatitis C Screening: does qualify; Completed 05/08/2018  Vision Screening: Recommended annual ophthalmology exams for early detection of glaucoma and other disorders of the eye. Is the patient up to date with their annual eye exam?  Yes  Who is the provider or what is the name of the office in which the patient attends annual eye exams? Dr. Precious Bard If pt is not established with a provider, would they like to be referred to a provider to  establish care? No .   Dental Screening: Recommended annual dental exams for proper oral hygiene  Community Resource Referral / Chronic Care Management: CRR required this visit?  No   CCM required this visit?  No      Plan:     I have personally reviewed and noted the following in the patient's chart:   Medical and social history Use of alcohol, tobacco or illicit drugs  Current medications and supplements including opioid prescriptions. Patient is not currently taking opioid prescriptions. Functional ability and status Nutritional status Physical activity Advanced directives List of other physicians Hospitalizations, surgeries, and ER visits in previous 12 months Vitals Screenings to include cognitive, depression, and falls Referrals and appointments  In addition, I have reviewed and discussed with patient certain preventive protocols, quality metrics, and best practice recommendations. A written personalized care plan for preventive services as well as general preventive health recommendations were provided to patient.     Kellie Simmering, LPN   [id number]   Nurse Notes: none  Due to this being a virtual visit, the after visit summary with patients personalized plan was offered to patient via mail or my-chart. Patient would  like to access on my-chart

## 2022-07-12 NOTE — Patient Instructions (Signed)
Ms. Sierra Glover , Thank you for taking time to come for your Medicare Wellness Visit. I appreciate your ongoing commitment to your health goals. Please review the following plan we discussed and let me know if I can assist you in the future.   These are the goals we discussed:  Goals      DIET - INCREASE WATER INTAKE     Exercise 150 min/wk Moderate Activity     Patient Stated     07/12/2022, wants to lose weight, increase water intake        This is a list of the screening recommended for you and due dates:  Health Maintenance  Topic Date Due   COVID-19 Vaccine (3 - Moderna risk series) 10/22/2019   Zoster (Shingles) Vaccine (1 of 2) 07/19/2022*   Flu Shot  09/17/2022*   Pneumonia Vaccine (1 - PCV) 12/16/2022*   Medicare Annual Wellness Visit  07/13/2023   Mammogram  12/09/2023   Colon Cancer Screening  05/26/2026   DTaP/Tdap/Td vaccine (3 - Td or Tdap) 05/08/2028   DEXA scan (bone density measurement)  Completed   Hepatitis C Screening: USPSTF Recommendation to screen - Ages 40-79 yo.  Completed   HPV Vaccine  Aged Out  *Topic was postponed. The date shown is not the original due date.    Advanced directives: Advance directive discussed with you today.   Conditions/risks identified: none  Next appointment: Follow up in one year for your annual wellness visit    Preventive Care 65 Years and Older, Female Preventive care refers to lifestyle choices and visits with your health care provider that can promote health and wellness. What does preventive care include? A yearly physical exam. This is also called an annual well check. Dental exams once or twice a year. Routine eye exams. Ask your health care provider how often you should have your eyes checked. Personal lifestyle choices, including: Daily care of your teeth and gums. Regular physical activity. Eating a healthy diet. Avoiding tobacco and drug use. Limiting alcohol use. Practicing safe sex. Taking low-dose aspirin  every day. Taking vitamin and mineral supplements as recommended by your health care provider. What happens during an annual well check? The services and screenings done by your health care provider during your annual well check will depend on your age, overall health, lifestyle risk factors, and family history of disease. Counseling  Your health care provider may ask you questions about your: Alcohol use. Tobacco use. Drug use. Emotional well-being. Home and relationship well-being. Sexual activity. Eating habits. History of falls. Memory and ability to understand (cognition). Work and work Statistician. Reproductive health. Screening  You may have the following tests or measurements: Height, weight, and BMI. Blood pressure. Lipid and cholesterol levels. These may be checked every 5 years, or more frequently if you are over 34 years old. Skin check. Lung cancer screening. You may have this screening every year starting at age 4 if you have a 30-pack-year history of smoking and currently smoke or have quit within the past 15 years. Fecal occult blood test (FOBT) of the stool. You may have this test every year starting at age 70. Flexible sigmoidoscopy or colonoscopy. You may have a sigmoidoscopy every 5 years or a colonoscopy every 10 years starting at age 18. Hepatitis C blood test. Hepatitis B blood test. Sexually transmitted disease (STD) testing. Diabetes screening. This is done by checking your blood sugar (glucose) after you have not eaten for a while (fasting). You may have this done  every 1-3 years. Bone density scan. This is done to screen for osteoporosis. You may have this done starting at age 79. Mammogram. This may be done every 1-2 years. Talk to your health care provider about how often you should have regular mammograms. Talk with your health care provider about your test results, treatment options, and if necessary, the need for more tests. Vaccines  Your health  care provider may recommend certain vaccines, such as: Influenza vaccine. This is recommended every year. Tetanus, diphtheria, and acellular pertussis (Tdap, Td) vaccine. You may need a Td booster every 10 years. Zoster vaccine. You may need this after age 52. Pneumococcal 13-valent conjugate (PCV13) vaccine. One dose is recommended after age 98. Pneumococcal polysaccharide (PPSV23) vaccine. One dose is recommended after age 7. Talk to your health care provider about which screenings and vaccines you need and how often you need them. This information is not intended to replace advice given to you by your health care provider. Make sure you discuss any questions you have with your health care provider. Document Released: 07/02/2015 Document Revised: 02/23/2016 Document Reviewed: 04/06/2015 Elsevier Interactive Patient Education  2017 [place] Prevention in the Home Falls can cause injuries. They can happen to people of all ages. There are many things you can do to make your home safe and to help prevent falls. What can I do on the outside of my home? Regularly fix the edges of walkways and driveways and fix any cracks. Remove anything that might make you trip as you walk through a door, such as a raised step or threshold. Trim any bushes or trees on the path to your home. Use bright outdoor lighting. Clear any walking paths of anything that might make someone trip, such as rocks or tools. Regularly check to see if handrails are loose or broken. Make sure that both sides of any steps have handrails. Any raised decks and porches should have guardrails on the edges. Have any leaves, snow, or ice cleared regularly. Use sand or salt on walking paths during winter. Clean up any spills in your garage right away. This includes oil or grease spills. What can I do in the bathroom? Use night lights. Install grab bars by the toilet and in the tub and shower. Do not use towel bars as grab  bars. Use non-skid mats or decals in the tub or shower. If you need to sit down in the shower, use a plastic, non-slip stool. Keep the floor dry. Clean up any water that spills on the floor as soon as it happens. Remove soap buildup in the tub or shower regularly. Attach bath mats securely with double-sided non-slip rug tape. Do not have throw rugs and other things on the floor that can make you trip. What can I do in the bedroom? Use night lights. Make sure that you have a light by your bed that is easy to reach. Do not use any sheets or blankets that are too big for your bed. They should not hang down onto the floor. Have a firm chair that has side arms. You can use this for support while you get dressed. Do not have throw rugs and other things on the floor that can make you trip. What can I do in the kitchen? Clean up any spills right away. Avoid walking on wet floors. Keep items that you use a lot in easy-to-reach places. If you need to reach something above you, use a strong step stool that has  a grab bar. Keep electrical cords out of the way. Do not use floor polish or wax that makes floors slippery. If you must use wax, use non-skid floor wax. Do not have throw rugs and other things on the floor that can make you trip. What can I do with my stairs? Do not leave any items on the stairs. Make sure that there are handrails on both sides of the stairs and use them. Fix handrails that are broken or loose. Make sure that handrails are as long as the stairways. Check any carpeting to make sure that it is firmly attached to the stairs. Fix any carpet that is loose or worn. Avoid having throw rugs at the top or bottom of the stairs. If you do have throw rugs, attach them to the floor with carpet tape. Make sure that you have a light switch at the top of the stairs and the bottom of the stairs. If you do not have them, ask someone to add them for you. What else can I do to help prevent  falls? Wear shoes that: Do not have high heels. Have rubber bottoms. Are comfortable and fit you well. Are closed at the toe. Do not wear sandals. If you use a stepladder: Make sure that it is fully opened. Do not climb a closed stepladder. Make sure that both sides of the stepladder are locked into place. Ask someone to hold it for you, if possible. Clearly mark and make sure that you can see: Any grab bars or handrails. First and last steps. Where the edge of each step is. Use tools that help you move around (mobility aids) if they are needed. These include: Canes. Walkers. Scooters. Crutches. Turn on the lights when you go into a dark area. Replace any light bulbs as soon as they burn out. Set up your furniture so you have a clear path. Avoid moving your furniture around. If any of your floors are uneven, fix them. If there are any pets around you, be aware of where they are. Review your medicines with your doctor. Some medicines can make you feel dizzy. This can increase your chance of falling. Ask your doctor what other things that you can do to help prevent falls. This information is not intended to replace advice given to you by your health care provider. Make sure you discuss any questions you have with your health care provider. Document Released: 04/01/2009 Document Revised: 11/11/2015 Document Reviewed: 07/10/2014 Elsevier Interactive Patient Education  2017 [employer].

## 2022-07-29 IMAGING — DX DG ANKLE COMPLETE 3+V*R*
3 series · 3 of 3 positions shown · non-contrast
Comparison: None.

CLINICAL DATA: Fall, trauma

EXAM:
RIGHT ANKLE - COMPLETE 3+ VIEW

[ankle ap]
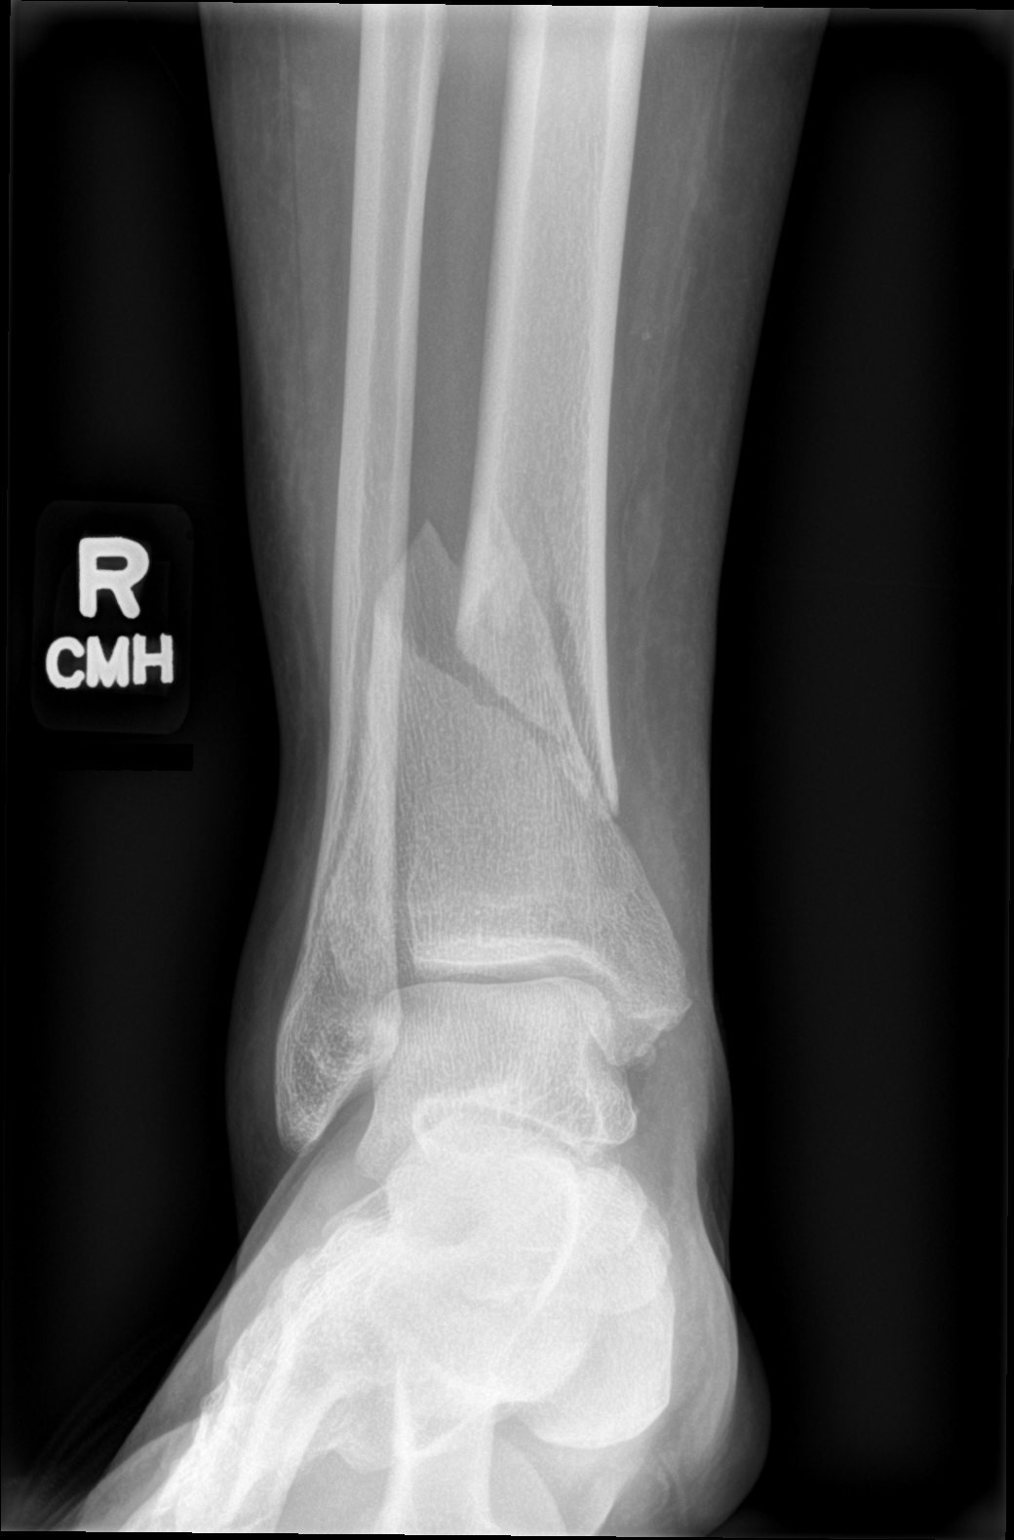

[ankle obl]
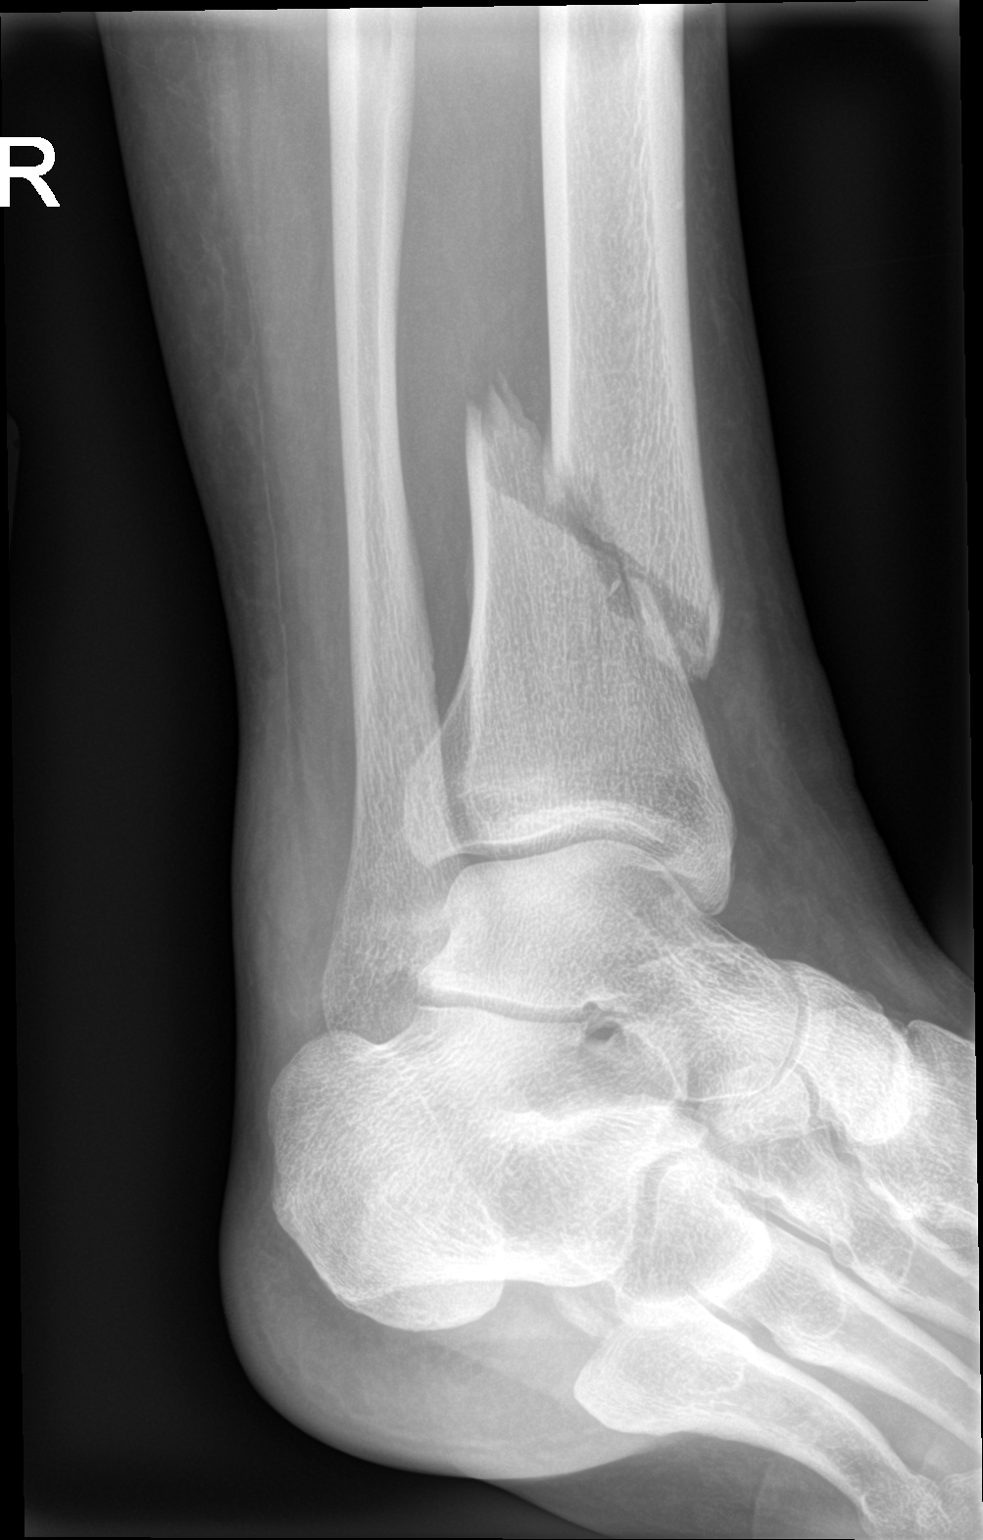

[ankle lat]
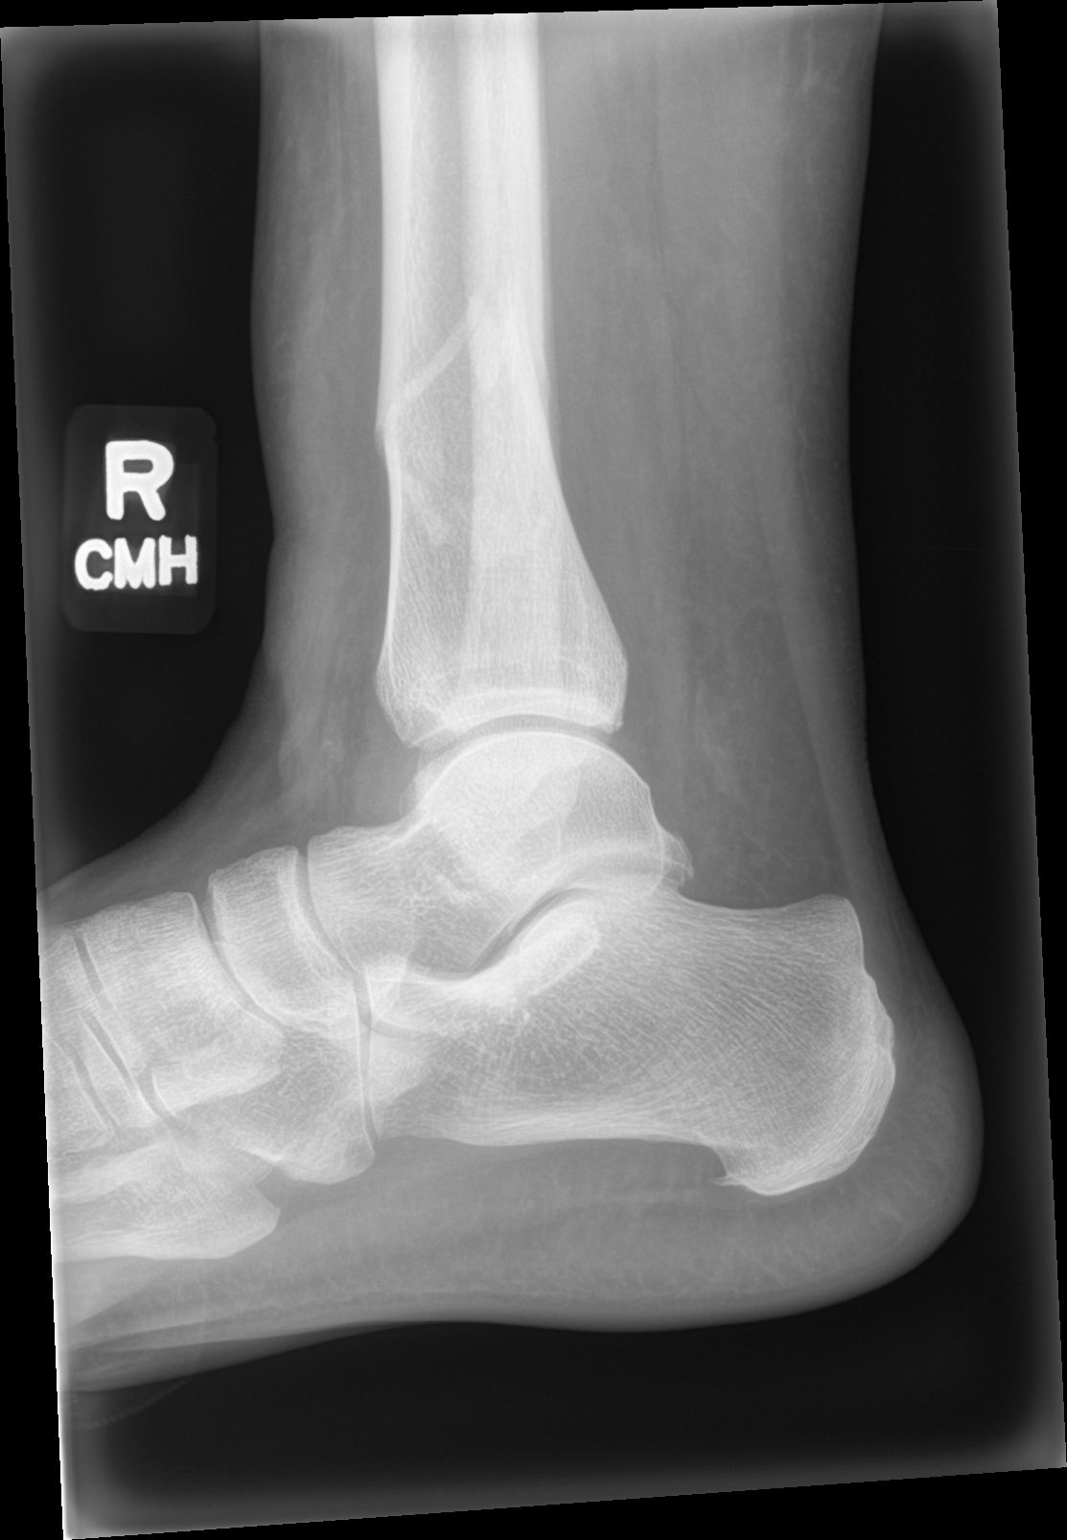

[3 of 3 positions shown; findings below may reference images not displayed]

FINDINGS: Oblique fracture through the distal tibial metaphysis at the
metadiaphyseal junction. There is 8 mm of lateral displacement of
the distal fracture fragment and 8 mm of posterior displacement.
Mild anterior override. Fracture does not enter the articular
surface. No dislocation. Ankle mortise intact.
IMPRESSION: Oblique displaced fracture through the distal tibial metadiaphysis.

## 2022-08-01 ENCOUNTER — Encounter: Payer: Self-pay | Admitting: Internal Medicine

## 2022-08-02 ENCOUNTER — Other Ambulatory Visit: Payer: Self-pay

## 2022-08-02 ENCOUNTER — Other Ambulatory Visit: Payer: Self-pay | Admitting: Internal Medicine

## 2022-08-02 MED ORDER — AMLODIPINE BESYLATE 10 MG PO TABS: 10.0000 mg | ORAL_TABLET | Freq: Every day | ORAL | 3 refills | Status: DC

## 2022-08-03 ENCOUNTER — Other Ambulatory Visit: Payer: Self-pay

## 2022-08-03 MED ORDER — AMLODIPINE BESYLATE 10 MG PO TABS: 10.0000 mg | ORAL_TABLET | Freq: Every day | ORAL | 1 refills | Status: DC

## 2022-08-04 IMAGING — RF DG C-ARM 1-60 MIN
1 series · 10 of 10 positions shown · non-contrast
Comparison: Preoperative radiograph 01/01/2021

CLINICAL DATA: Right tibial nail.

EXAM:
RIGHT TIBIA AND FIBULA - 2 VIEW; DG C-ARM 1-60 MIN

[Series 1: run · 10 of 10 slices shown]
[im 1/10]
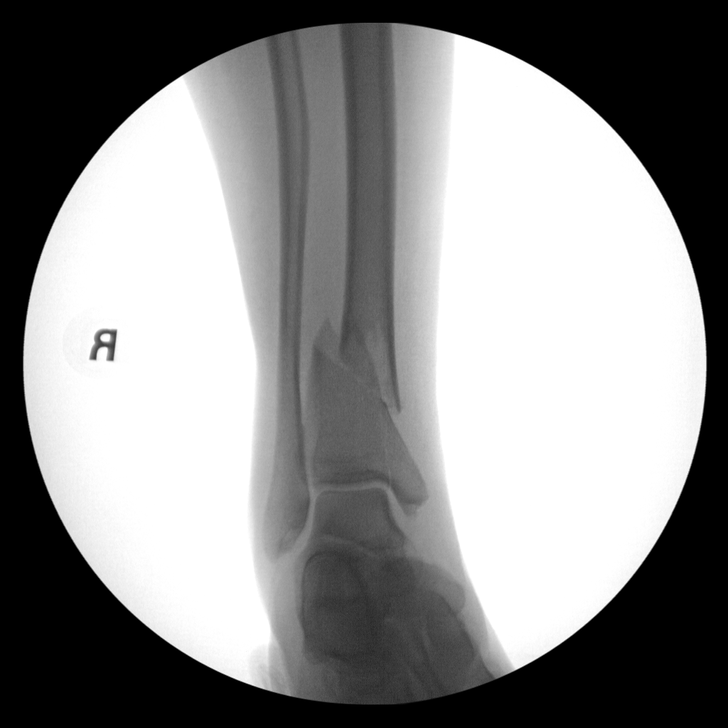
[im 2/10]
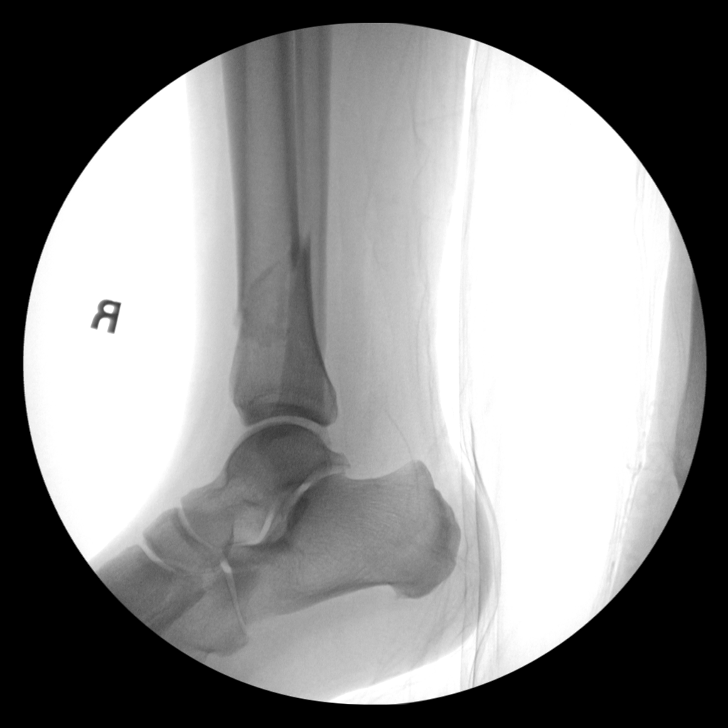
[im 3/10]
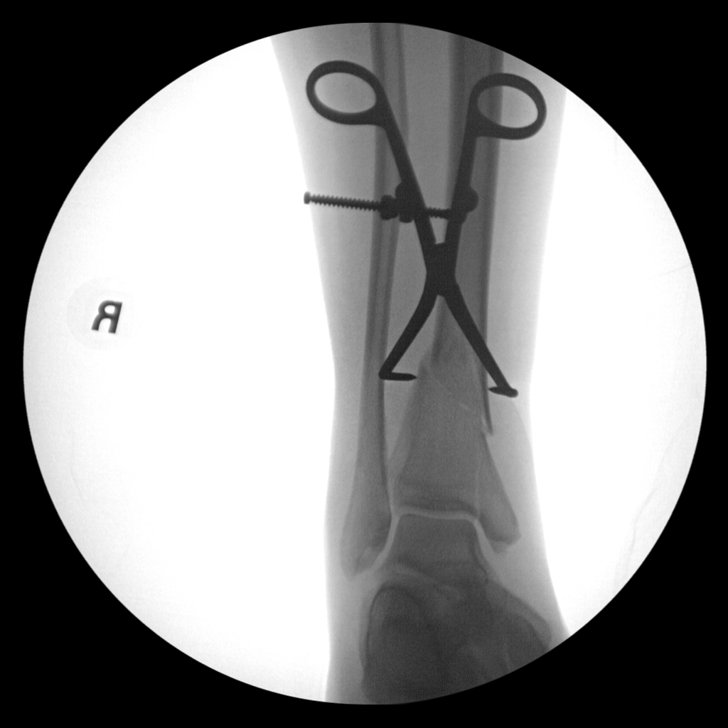
[im 4/10]
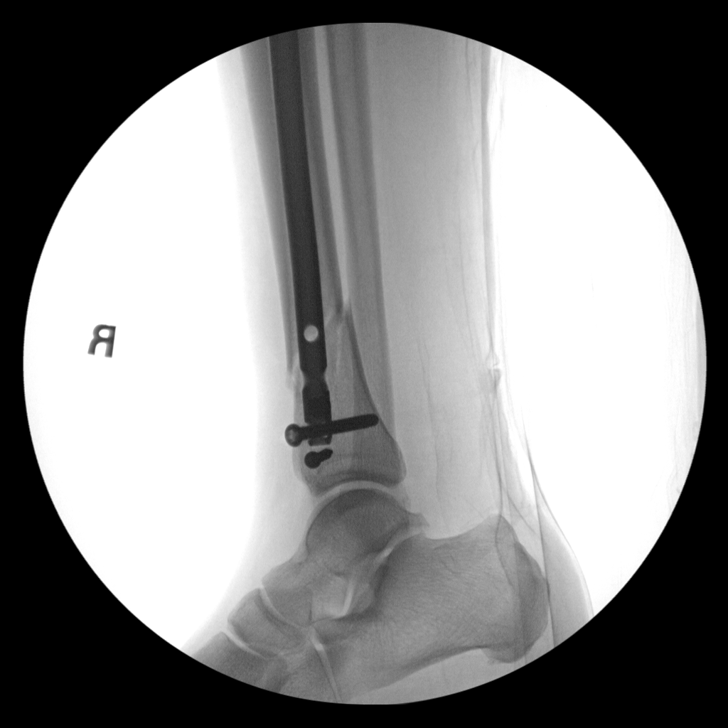
[im 5/10]
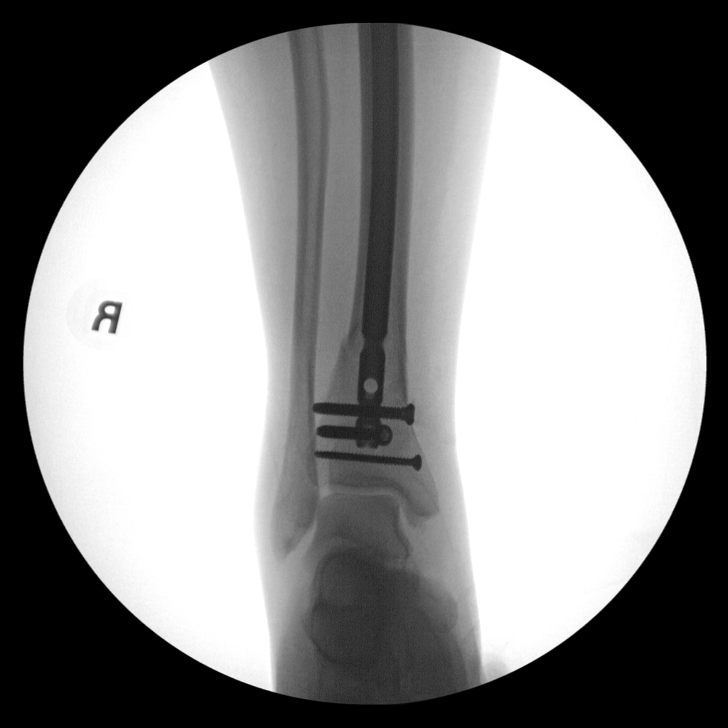
[im 6/10]
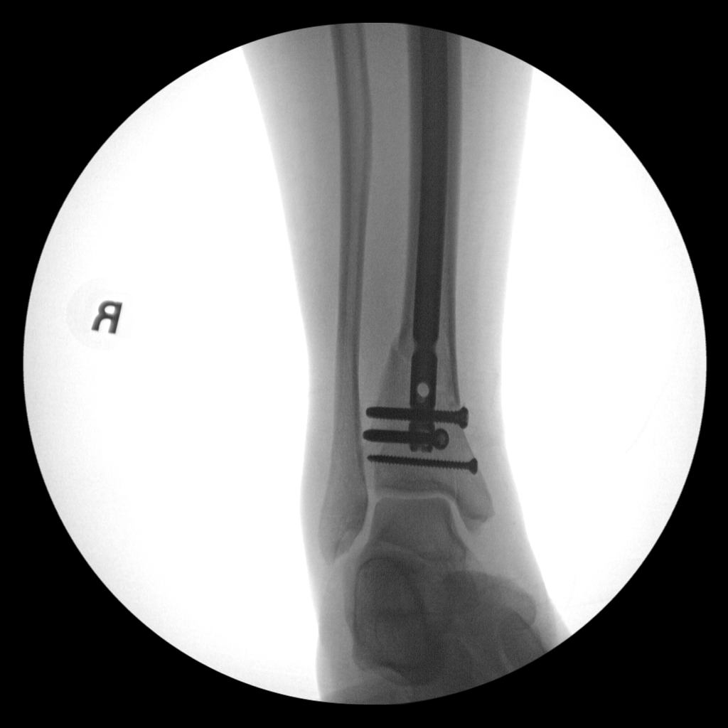
[im 7/10]
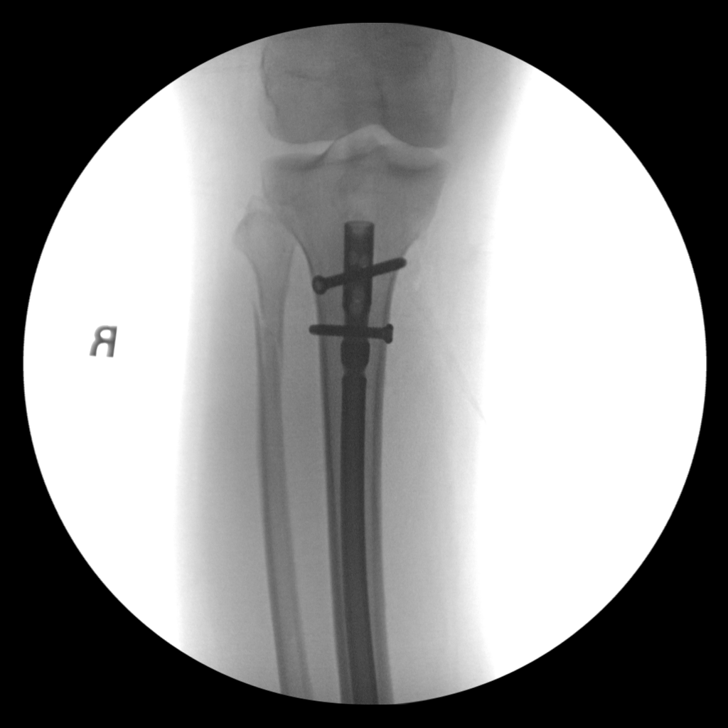
[im 8/10]
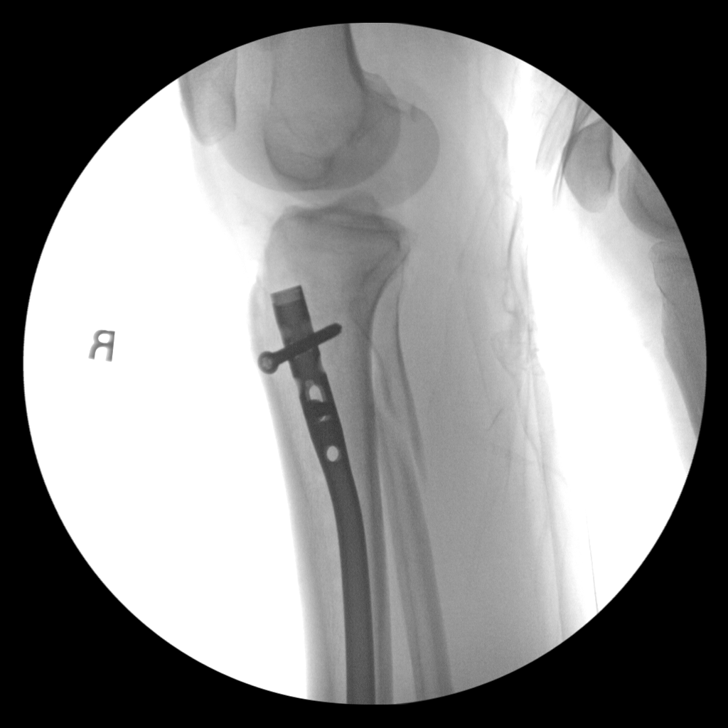
[im 9/10]
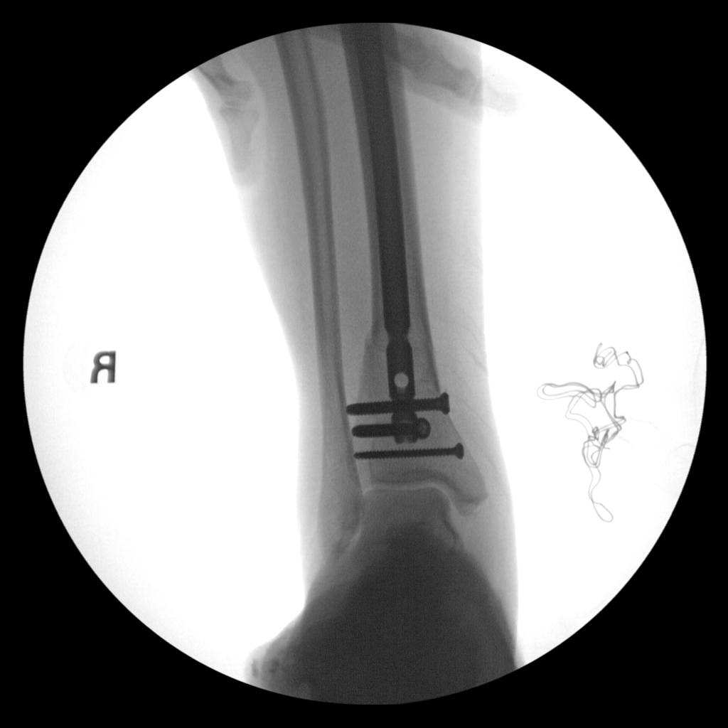
[im 10/10]
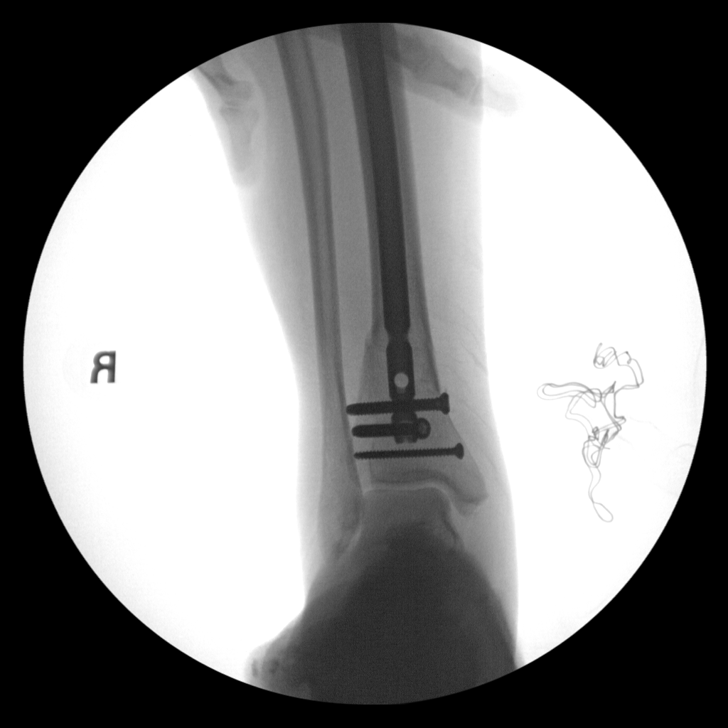

[10 of 10 positions shown; findings below may reference images not displayed]

FINDINGS: Nine fluoroscopic spot views of the right tibia and fibula obtained
in the operating room. Intramedullary nail with proximal and distal
locking screws traverse distal tibial shaft fracture. There is an
oblique mildly displaced proximal fibular fracture that was not
included on the ankle exam. Fluoroscopy time 2 minutes 19 seconds.
Dose 3.61 mGy.
IMPRESSION: 1. Intraoperative fluoroscopy during right tibial intramedullary
nail fixation of distal tibial fracture. No visualized complication.
2. Displaced proximal fibular fracture, not included on recent ankle
exam.

## 2022-08-07 ENCOUNTER — Other Ambulatory Visit: Payer: Self-pay

## 2022-08-07 MED ORDER — AMLODIPINE BESYLATE 10 MG PO TABS: 10.0000 mg | ORAL_TABLET | Freq: Every day | ORAL | 1 refills | Status: DC

## 2022-08-09 ENCOUNTER — Encounter: Payer: Self-pay | Admitting: Internal Medicine

## 2022-08-10 ENCOUNTER — Other Ambulatory Visit: Payer: Self-pay

## 2022-08-10 MED ORDER — AMLODIPINE BESYLATE 10 MG PO TABS: 10.0000 mg | ORAL_TABLET | Freq: Every day | ORAL | 1 refills | Status: DC

## 2022-08-17 ENCOUNTER — Encounter: Payer: Self-pay | Admitting: Radiology

## 2022-08-23 NOTE — Progress Notes (Signed)
I,Sierra Glover,acting as a scribe for Sierra Greenland, MD.,have documented all relevant documentation on the behalf of Sierra Greenland, MD,as directed by  Sierra Greenland, MD while in the presence of Sierra Greenland, MD.   Subjective:     Patient ID: Sierra Glover , female    DOB: 1956/01/19 , 67 y.o.   MRN: [id number]   Chief Complaint  Patient presents with   Annual Exam   Hypertension    HPI  Patient presents today for her annual exam. She is no longer followed by GYN. She reports compliance with meds. She denies headache, SOB, chest pain, and blurred vision.    Hypertension This is a chronic problem. The current episode started more than 1 year ago. The problem has been gradually improving since onset. The problem is controlled. Pertinent negatives include no blurred vision, chest pain, palpitations or shortness of breath. Past treatments include diuretics. The current treatment provides moderate improvement.     Past Medical History:  Diagnosis Date   Anemia    before my hysterectomy   Dizziness    DVT (deep venous thrombosis) (HCC)    "Many, many years ago."   HTN (hypertension)    Nausea    Palpitation    Serum calcium elevated      Family History  Problem Relation Age of Onset   Alzheimer's disease Mother    Hypertension Mother    Hypertension Father    COPD Father    Hypertension Other      Current Outpatient Medications:    amLODipine (NORVASC) 10 MG tablet, Take 1 tablet (10 mg total) by mouth daily., Disp: 90 tablet, Rfl: 1   atorvastatin (LIPITOR) 20 MG tablet, TAKE 1 TABLET BY MOUTH EVERY DAY, Disp: 90 tablet, Rfl: 3   B Complex Vitamins (VITAMIN B COMPLEX PO), Take by mouth., Disp: , Rfl:    ciclopirox (PENLAC) 8 % solution, Apply topically at bedtime. Apply over nail and surrounding skin. Apply daily over previous coat. After seven (7) days, may remove with alcohol and continue cycle., Disp: 6.6 mL, Rfl: 0   diazepam (VALIUM) 2 MG tablet,  One tab po 1hr prior to procedure, repeat if needed, Disp: 2 tablet, Rfl: 0   fluticasone (FLONASE) 50 MCG/ACT nasal spray, PLACE 2 SPRAYS INTO BOTH NOSTRILS DAILY AS NEEDED FOR ALLERGIES OR RHINITIS., Disp: 48 mL, Rfl: 1   meclizine (ANTIVERT) 25 MG tablet, Take 1 tablet (25 mg total) by mouth 2 (two) times daily as needed for dizziness., Disp: 30 tablet, Rfl: 0   ondansetron (ZOFRAN ODT) 8 MG disintegrating tablet, Take 1 tablet (8 mg total) by mouth every 8 (eight) hours as needed for nausea or vomiting., Disp: 20 tablet, Rfl: 0   pantoprazole (PROTONIX) 40 MG tablet, Take 1 tablet (40 mg total) by mouth daily., Disp: 90 tablet, Rfl: 3   valACYclovir (VALTREX) 500 MG tablet, take 1 tablet by oral route  every day (Patient taking differently: as needed. take 1 tablet by oral route  every day), Disp: 90 tablet, Rfl: 0   Zinc Oxide-Vitamin C (ZINC PLUS VITAMIN C PO), Take by mouth., Disp: , Rfl:    loratadine (CLARITIN) 10 MG tablet, Take 1 tablet (10 mg total) by mouth daily., Disp: 30 tablet, Rfl: 2   valsartan-hydrochlorothiazide (DIOVAN-HCT) 160-12.5 MG tablet, Take 1 tablet by mouth daily., Disp: 90 tablet, Rfl: 1   No Known Allergies    The patient states she uses post menopausal status for  birth control. Last LMP was No LMP recorded. Patient has had a hysterectomy.. Negative for Dysmenorrhea. Negative for: breast discharge, breast lump(s), breast pain and breast self exam. Associated symptoms include abnormal vaginal bleeding. Pertinent negatives include abnormal bleeding (hematology), anxiety, decreased libido, depression, difficulty falling sleep, dyspareunia, history of infertility, nocturia, sexual dysfunction, sleep disturbances, urinary incontinence, urinary urgency, vaginal discharge and vaginal itching. Diet regular.The patient states her exercise level is  intermittent.  . The patient's tobacco use is:  Social History   Tobacco Use  Smoking Status Never  Smokeless Tobacco Never   Tobacco Comments   N/a  . She has been exposed to passive smoke. The patient's alcohol use is:  Social History   Substance and Sexual Activity  Alcohol Use Not Currently   Comment: social     Review of Systems  Constitutional: Negative.   HENT: Negative.    Eyes: Negative.  Negative for blurred vision.  Respiratory: Negative.  Negative for shortness of breath.   Cardiovascular: Negative.  Negative for chest pain and palpitations.  Gastrointestinal: Negative.   Endocrine: Negative.   Genitourinary: Negative.   Musculoskeletal: Negative.   Skin: Negative.   Allergic/Immunologic: Negative.   Neurological: Negative.   Hematological: Negative.   Psychiatric/Behavioral: Negative.       Today's Vitals   08/24/22 0945  BP: 118/78  Temp: 98.3 F (36.8 C)  SpO2: 98%  Weight: 154 lb (69.9 kg)  Height: 5\' 6"  (1.676 m)   Body mass index is 24.86 kg/m.  Wt Readings from Last 3 Encounters:  08/24/22 154 lb (69.9 kg)  07/12/22 151 lb (68.5 kg)  06/20/22 157 lb (71.2 kg)    Objective:  Physical Exam Vitals and nursing note reviewed.  Constitutional:      Appearance: Normal appearance.  HENT:     Head: Normocephalic and atraumatic.     Right Ear: Tympanic membrane, ear canal and external ear normal.     Left Ear: Tympanic membrane, ear canal and external ear normal.     Nose:     Comments: Masked     Mouth/Throat:     Comments: Masked  Eyes:     Extraocular Movements: Extraocular movements intact.     Conjunctiva/sclera: Conjunctivae normal.     Pupils: Pupils are equal, round, and reactive to light.  Cardiovascular:     Rate and Rhythm: Normal rate and regular rhythm.     Pulses: Normal pulses.          Dorsalis pedis pulses are 2+ on the right side and 2+ on the left side.     Heart sounds: Normal heart sounds.  Pulmonary:     Effort: Pulmonary effort is normal.     Breath sounds: Normal breath sounds.  Chest:  Breasts:    Tanner Score is 5.     Right: Normal.      Left: Normal.  Abdominal:     General: Abdomen is flat. Bowel sounds are normal.     Palpations: Abdomen is soft.  Genitourinary:    Comments: deferred Musculoskeletal:        General: Normal range of motion.     Cervical back: Normal range of motion and neck supple.  Skin:    General: Skin is warm and dry.  Neurological:     General: No focal deficit present.     Mental Status: She is alert and oriented to person, place, and time.  Psychiatric:        Mood and Affect:  Mood normal.        Behavior: Behavior normal.      Assessment And Plan:     1. Encounter for annual health examination Comments: A full exam was performed.  Importance of monthly self breast exams was discussed with the patient.  PATIENT IS ADVISED TO GET 30-45 MINUTES REGULAR EXERCISE NO LESS THAN FOUR TO FIVE DAYS PER WEEK - BOTH WEIGHTBEARING EXERCISES AND AEROBIC ARE RECOMMENDED.  PATIENT IS ADVISED TO FOLLOW A HEALTHY DIET WITH AT LEAST SIX FRUITS/VEGGIES PER DAY, DECREASE INTAKE OF RED MEAT, AND TO INCREASE FISH INTAKE TO TWO DAYS PER WEEK.  MEATS/FISH SHOULD NOT BE FRIED, BAKED OR BROILED IS PREFERABLE.  IT IS ALSO IMPORTANT TO CUT BACK ON YOUR SUGAR INTAKE. PLEASE AVOID ANYTHING WITH ADDED SUGAR, CORN SYRUP OR OTHER SWEETENERS. IF YOU MUST USE A SWEETENER, YOU CAN TRY STEVIA. IT IS ALSO IMPORTANT TO AVOID ARTIFICIALLY SWEETENERS AND DIET BEVERAGES. LASTLY, I SUGGEST WEARING SPF 50 SUNSCREEN ON EXPOSED PARTS AND ESPECIALLY WHEN IN THE DIRECT SUNLIGHT FOR AN EXTENDED PERIOD OF TIME.  PLEASE AVOID FAST FOOD RESTAURANTS AND INCREASE YOUR WATER INTAKE.  2. Essential hypertension, benign Comments: Chronic, well controlled. EKG performed, NSR w/ nonspecific T abnormality.  She will c/w amlodipine 10mg  & valsartan 160/12.5mg  daily. She will f/u in 4-6 mos. - POCT Urinalysis Dipstick (81002) - Microalbumin / creatinine urine ratio - EKG 12-Lead - valsartan-hydrochlorothiazide (DIOVAN-HCT) 160-12.5 MG tablet; Take 1  tablet by mouth daily.  Dispense: 90 tablet; Refill: 1 - CMP14+EGFR - CBC - Lipid panel  3. Pure hypercholesterolemia Comments: Chronic, currnetly on atorvastatin 20mg  daily. She is encouraged to follow a heart healthy lifestyle. She will f/u in 6 months, also followed by Cardiology. - CMP14+EGFR - Lipid panel  4. History of anemia due to vitamin B12 deficiency - Vitamin B12 - cyanocobalamin (VITAMIN B12) injection 1,000 mcg  5. Herpes zoster vaccination declined  Patient was given opportunity to ask questions. Patient verbalized understanding of the plan and was able to repeat key elements of the plan. All questions were answered to their satisfaction.   I, Sierra Greenland, MD, have reviewed all documentation for this visit. The documentation on 08/24/22 for the exam, diagnosis, procedures, and orders are all accurate and complete.   THE PATIENT IS ENCOURAGED TO PRACTICE SOCIAL DISTANCING DUE TO THE COVID-19 PANDEMIC.

## 2022-08-24 ENCOUNTER — Ambulatory Visit (INDEPENDENT_AMBULATORY_CARE_PROVIDER_SITE_OTHER): Payer: Medicare Other | Admitting: Internal Medicine

## 2022-08-24 ENCOUNTER — Encounter: Payer: Self-pay | Admitting: Internal Medicine

## 2022-08-24 VITALS — BP 118/78 | Temp 98.3°F | Ht 66.0 in | Wt 154.0 lb

## 2022-08-24 DIAGNOSIS — Z0000 Encounter for general adult medical examination without abnormal findings: Secondary | ICD-10-CM | POA: Diagnosis not present

## 2022-08-24 DIAGNOSIS — E7800 Pure hypercholesterolemia, unspecified: Secondary | ICD-10-CM

## 2022-08-24 DIAGNOSIS — I10 Essential (primary) hypertension: Secondary | ICD-10-CM | POA: Diagnosis not present

## 2022-08-24 DIAGNOSIS — Z862 Personal history of diseases of the blood and blood-forming organs and certain disorders involving the immune mechanism: Secondary | ICD-10-CM

## 2022-08-24 DIAGNOSIS — Z2821 Immunization not carried out because of patient refusal: Secondary | ICD-10-CM | POA: Diagnosis not present

## 2022-08-24 DIAGNOSIS — E559 Vitamin D deficiency, unspecified: Secondary | ICD-10-CM

## 2022-08-24 LAB — POCT URINALYSIS DIPSTICK
Bilirubin, UA: NEGATIVE
Blood, UA: NEGATIVE
Glucose, UA: NEGATIVE
Ketones, UA: NEGATIVE
Leukocytes, UA: NEGATIVE
Nitrite, UA: NEGATIVE
Protein, UA: NEGATIVE
Spec Grav, UA: 1.01 (ref 1.010–1.025)
Urobilinogen, UA: 0.2 E.U./dL
pH, UA: 6 (ref 5.0–8.0)

## 2022-08-24 MED ORDER — VALSARTAN-HYDROCHLOROTHIAZIDE 160-12.5 MG PO TABS: 1.0000 | ORAL_TABLET | Freq: Every day | ORAL | 1 refills | Status: DC

## 2022-08-24 MED ORDER — CYANOCOBALAMIN 1000 MCG/ML IJ SOLN
1000.0000 ug | Freq: Once | INTRAMUSCULAR | Status: AC
  Administered 2022-08-24: 1000 ug via INTRAMUSCULAR

## 2022-08-24 NOTE — Patient Instructions (Signed)

## 2022-08-25 LAB — CMP14+EGFR
ALT: 13 IU/L (ref 0–32)
AST: 21 IU/L (ref 0–40)
Albumin/Globulin Ratio: 1.5 (ref 1.2–2.2)
Albumin: 4.5 g/dL (ref 3.9–4.9)
Alkaline Phosphatase: 122 IU/L — ABNORMAL HIGH (ref 44–121)
BUN/Creatinine Ratio: 10 — ABNORMAL LOW (ref 12–28)
BUN: 9 mg/dL (ref 8–27)
Bilirubin Total: 0.5 mg/dL (ref 0.0–1.2)
CO2: 26 mmol/L (ref 20–29)
Calcium: 10.2 mg/dL (ref 8.7–10.3)
Chloride: 98 mmol/L (ref 96–106)
Creatinine, Ser: 0.87 mg/dL (ref 0.57–1.00)
Globulin, Total: 3 g/dL (ref 1.5–4.5)
Glucose: 90 mg/dL (ref 70–99)
Potassium: 4.1 mmol/L (ref 3.5–5.2)
Sodium: 140 mmol/L (ref 134–144)
Total Protein: 7.5 g/dL (ref 6.0–8.5)
eGFR: 73 mL/min/{1.73_m2} (ref >59)

## 2022-08-25 LAB — LIPID PANEL
Chol/HDL Ratio: 2.7 ratio (ref 0.0–4.4)
Cholesterol, Total: 139 mg/dL (ref 100–199)
HDL: 52 mg/dL (ref >39)
LDL Chol Calc (NIH): 70 mg/dL (ref 0–99)
Triglycerides: 87 mg/dL (ref 0–149)
VLDL Cholesterol Cal: 17 mg/dL (ref 5–40)

## 2022-08-25 LAB — VITAMIN B12: Vitamin B-12: 996 pg/mL (ref 232–1245)

## 2022-08-25 LAB — CBC
Hematocrit: 37.7 % (ref 34.0–46.6)
Hemoglobin: 12.6 g/dL (ref 11.1–15.9)
MCH: 28.1 pg (ref 26.6–33.0)
MCHC: 33.4 g/dL (ref 31.5–35.7)
MCV: 84 fL (ref 79–97)
Platelets: 339 10*3/uL (ref 150–450)
RBC: 4.48 x10E6/uL (ref 3.77–5.28)
RDW: 12.3 % (ref 11.7–15.4)
WBC: 5.5 10*3/uL (ref 3.4–10.8)

## 2022-08-26 LAB — MICROALBUMIN / CREATININE URINE RATIO
Creatinine, Urine: 41.1 mg/dL
Microalb/Creat Ratio: <7 mg/g creat (ref 0–29)
Microalbumin, Urine: <3 ug/mL

## 2022-08-28 ENCOUNTER — Encounter: Payer: Self-pay | Admitting: Internal Medicine

## 2022-08-31 ENCOUNTER — Encounter: Payer: Self-pay | Admitting: Internal Medicine

## 2022-09-03 DIAGNOSIS — Z862 Personal history of diseases of the blood and blood-forming organs and certain disorders involving the immune mechanism: Secondary | ICD-10-CM | POA: Insufficient documentation

## 2022-09-03 DIAGNOSIS — E7800 Pure hypercholesterolemia, unspecified: Secondary | ICD-10-CM | POA: Insufficient documentation

## 2022-09-07 ENCOUNTER — Encounter: Payer: Self-pay | Admitting: Internal Medicine

## 2022-09-14 ENCOUNTER — Encounter: Payer: Self-pay | Admitting: Internal Medicine

## 2022-09-14 ENCOUNTER — Ambulatory Visit: Payer: Medicare Other | Attending: Internal Medicine | Admitting: Internal Medicine

## 2022-09-14 VITALS — BP 116/68 | HR 75 | Ht 66.0 in | Wt 152.0 lb

## 2022-09-14 DIAGNOSIS — E785 Hyperlipidemia, unspecified: Secondary | ICD-10-CM

## 2022-09-14 DIAGNOSIS — I10 Essential (primary) hypertension: Secondary | ICD-10-CM | POA: Diagnosis not present

## 2022-09-14 DIAGNOSIS — I2510 Atherosclerotic heart disease of native coronary artery without angina pectoris: Secondary | ICD-10-CM | POA: Diagnosis not present

## 2022-09-14 DIAGNOSIS — I2584 Coronary atherosclerosis due to calcified coronary lesion: Secondary | ICD-10-CM

## 2022-09-14 DIAGNOSIS — R079 Chest pain, unspecified: Secondary | ICD-10-CM | POA: Diagnosis not present

## 2022-09-14 MED ORDER — DILTIAZEM HCL ER COATED BEADS 180 MG PO CP24: 180.0000 mg | ORAL_CAPSULE | Freq: Every day | ORAL | 3 refills | Status: DC

## 2022-09-14 NOTE — Progress Notes (Signed)
Cardiology Office Note:    Date:  09/14/2022   ID:  [name], DOB 1956/01/27, MRN [id number]  PCP:  Sierra Chard, MD   [facility] Providers Cardiologist:  Sierra Lean, MD     Referring MD: Sierra Chard, MD   CC: Follow up secondary prevention  History of Present Illness:    Sierra Glover is a 67 y.o. female with a hx of HTN, Remote DVT, who presents for evaluation 06/23/21. 2023: negative stress test.  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  Normal heart monitor.  Started PPI  Patient notes that she is doing ok.   Has rare chest pain not food related under her left breast.  Woke her up from sleep and it was pretty scare. There are no interval hospital/ED visit.   Prior to Wednesday has been feeling well. No weight gain or leg swelling.  Was feeling faint.  Ambulatory blood pressure 130-140/70-80 .  Past Medical History:  Diagnosis Date   Anemia    before my hysterectomy   Dizziness    DVT (deep venous thrombosis) (HCC)    "Many, many years ago."   HTN (hypertension)    Nausea    Palpitation    Serum calcium elevated     Past Surgical History:  Procedure Laterality Date   COLONOSCOPY     partal hysterectomy  2009   TIBIA IM NAIL INSERTION Right 01/07/2021   Procedure: INTRAMEDULLARY (IM) NAIL TIBIAL;  Surgeon: Sierra Needles, MD;  Location: [place];  Service: Orthopedics;  Laterality: Right;    Current Medications: Current Meds  Medication Sig   atorvastatin (LIPITOR) 20 MG tablet TAKE 1 TABLET BY MOUTH EVERY DAY   B Complex Vitamins (VITAMIN B COMPLEX PO) Take by mouth.   ciclopirox (PENLAC) 8 % solution Apply topically at bedtime. Apply over nail and surrounding skin. Apply daily over previous coat. After seven (7) days, may remove with alcohol and continue cycle. (Patient taking differently: Apply topically as needed (nail fungus). Apply over nail and surrounding skin.  Apply daily over previous coat. After seven (7) days, may remove with alcohol and continue cycle.)   diazepam (VALIUM) 2 MG tablet One tab po 1hr prior to procedure, repeat if needed   diltiazem (CARDIZEM CD) 180 MG 24 hr capsule Take 1 capsule (180 mg total) by mouth daily.   fluticasone (FLONASE) 50 MCG/ACT nasal spray PLACE 2 SPRAYS INTO BOTH NOSTRILS DAILY AS NEEDED FOR ALLERGIES OR RHINITIS.   loratadine (CLARITIN) 10 MG tablet Take 1 tablet (10 mg total) by mouth daily. (Patient taking differently: Take 10 mg by mouth as needed for allergies.)   meclizine (ANTIVERT) 25 MG tablet Take 1 tablet (25 mg total) by mouth 2 (two) times daily as needed for dizziness.   ondansetron (ZOFRAN ODT) 8 MG disintegrating tablet Take 1 tablet (8 mg total) by mouth every 8 (eight) hours as needed for nausea or vomiting.   pantoprazole (PROTONIX) 40 MG tablet Take 1 tablet (40 mg total) by mouth daily.   valACYclovir (VALTREX) 500 MG tablet take 1 tablet by oral route  every day (Patient taking differently: as needed (cold sores). take 1 tablet by oral route  every day)   valsartan-hydrochlorothiazide (DIOVAN-HCT) 160-12.5 MG tablet Take 1 tablet by mouth daily.   Zinc Oxide-Vitamin C (ZINC PLUS VITAMIN C PO) Take by mouth.   [DISCONTINUED] amLODipine (NORVASC) 10 MG tablet Take 1 tablet (10 mg total)  by mouth daily.     Allergies:   Patient has no known allergies.   Social History   Socioeconomic History   Marital status: Married    Spouse name: Not on file   Number of children: Not on file   Years of education: Not on file   Highest education level: Not on file  Occupational History   Not on file  Tobacco Use   Smoking status: Never   Smokeless tobacco: Never   Tobacco comments:    N/a  Vaping Use   Vaping Use: Never used  Substance and Sexual Activity   Alcohol use: Not Currently    Comment: social   Drug use: Never   Sexual activity: Yes  Other Topics Concern   Not on file  Social  History Narrative   Not on file   Social Determinants of Health   Financial Resource Strain: Low Risk  (07/12/2022)   Overall Financial Resource Strain (CARDIA)    Difficulty of Paying Living Expenses: Not hard at all  Food Insecurity: No Food Insecurity (07/12/2022)   Hunger Vital Sign    Worried About Running Out of Food in the Last Year: Never true    [place] in the Last Year: Never true  Transportation Needs: No Transportation Needs (07/12/2022)   PRAPARE - Hydrologist (Medical): No    Lack of Transportation (Non-Medical): No  Physical Activity: Inactive (07/12/2022)   Exercise Vital Sign    Days of Exercise per Week: 0 days    Minutes of Exercise per Session: 0 min  Stress: No Stress Concern Present (07/12/2022)   [place]    Feeling of Stress : Not at all  Social Connections: Not on file    Family History: The patient's family history includes Alzheimer's disease in her mother; COPD in her father; Hypertension in her father, mother, and another family member. No family history of heart disease.  ROS:   Please see the history of present illness.     All other systems reviewed and are negative.  EKGs/Labs/Other Studies Reviewed:    The following studies were reviewed today:  EKG:   06/23/20: SR rate 74 WNL  Cardiac Studies & Procedures     STRESS TESTS  MYOCARDIAL PERFUSION IMAGING 07/14/2021  Narrative   The study is normal. The study is low risk.   No ST deviation was noted.   Left ventricular function is normal. Nuclear stress EF: 73 %. The left ventricular ejection fraction is hyperdynamic (>65%). End diastolic cavity size is normal. End systolic cavity size is normal.   Prior study not available for comparison.  Normal stress nuclear study with no ischemia or infarction.  Gated ejection fraction 73% with normal wall motion.     MONITORS  LONG TERM MONITOR  (3-14 DAYS) 07/30/2021  Narrative  Patient had a minimum heart rate of 47 bpm, maximum heart rate of 165 bpm, and average heart rate of 74 bpm.  Predominant underlying rhythm was sinus rhythm.  Isolated PACs were rare (<1.0%).  Isolated PVCs were rare (<1.0%).  No triggered events.  No malignant arrhythmias.   CT SCANS  CT CORONARY MORPH W/CTA COR W/SCORE 02/14/2022  Addendum 02/14/2022 10:00 AM ADDENDUM REPORT: 02/14/2022 09:58  EXAM: OVER-READ INTERPRETATION  CT CHEST  The following report is an over-read performed by radiologist Dr. Norlene Duel [facility] on 02/14/2022. This over-read does not include interpretation of  cardiac or coronary anatomy or pathology. The coronary CTA interpretation by the cardiologist is attached.  COMPARISON:  None.  FINDINGS: Vascular: No acute abnormality.  Mediastinum/nodes: No mass or adenopathy identified. Patulous esophagus may reflect underlying dysmotility.  Lungs/pleura: No pleural effusion, airspace consolidation, or pneumothorax. No suspicious lung nodules identified.  Upper abdomen: No acute abnormality.  Musculoskeletal: No chest wall mass. No acute or suspicious osseous findings.  IMPRESSION: 1. No significant noncardiac supplemental findings. 2. Patulous esophagus, which may reflect underlying dysmotility.   Electronically Signed By: Kerby Moors M.D. On: 02/14/2022 09:58  Narrative CLINICAL DATA:  67 Year old African American Female  EXAM: Cardiac/Coronary  CTA  TECHNIQUE: The patient was scanned on a [employer].  FINDINGS: Scan was triggered in the descending thoracic aorta. Axial non-contrast 3 mm slices were carried out through the heart. The data set was analyzed on a dedicated work station and scored using the [place]. Gantry rotation speed was 250 msecs and collimation was .6 mm. 0.8 mg of sl NTG was given. The 3D data set was reconstructed in 5% intervals of  the 67-82 % of the R-R cycle. Diastolic phases were analyzed on a dedicated work station using MPR, MIP and VRT modes. The patient received 200 cc of contrast- due to patient breathing artifact affecting RCA, repeat scan with breath hold. Artifact resolves on second scan.  Coronary Arteries:  Normal coronary origin.  Right dominance.  Coronary Calcium Score:  Left main: 0  Left anterior descending artery: 27  Left circumflex artery: 0  Right coronary artery: 0  Total: 27  Percentile: 73rd for age, sex, and race matched control.  Plaque Analysis:  Left main: 0  Left anterior descending artery: 1 cubic mm calcified plaque, 10 cubic mm non calcified plaque, no low attenuation plaque.  Left circumflex artery:9 cubic mm non calcified plaque, no low attenuation plaque.  Right coronary artery: 0  Total: 27  Percentile: 73rd for age, sex, and race matched control.  RCA is a large dominant artery that gives rise to PDA and PLA. There is no significant plaque.  Left main is a large artery that gives rise to LAD and LCX arteries. There is no significant plaque.  LAD is a large vessel that gives rise to one large D1 Branch that bifurcates. There is minimal non obstructive calcified plaque (1-24%) in the proximal LAD.  LCX is a non-dominant artery that gives rise to one large OM1 branch that bifurcates. There is no significant plaque.  Other findings:  Aorta: Normal size.  No calcifications.  No dissection.  Main Pulmonary Artery: Normal size of the pulmonary artery.  Aortic Valve:  Tri-leaflet.  No calcifications.  Normal pulmonary vein drainage into the left atrium.  Normal left atrial appendage without a thrombus.  Extra-cardiac findings: See attached radiology report for non-cardiac structures.  IMPRESSION: 1. Coronary calcium score of 27. This was 73rd percentile for age, sex, and race matched control.  2. Normal coronary origin with right  dominance.  3. CAD-RADS 1. Minimal non-obstructive CAD (1-24%). Consider non-atherosclerotic causes of chest pain. Consider preventive therapy and risk factor modification.  RECOMMENDATIONS:  Coronary artery calcium (CAC) score is a strong predictor of incident coronary heart disease (CHD) and provides predictive information beyond traditional risk factors. CAC scoring is reasonable to use in the decision to withhold, postpone, or initiate statin therapy in intermediate-risk or selected borderline-risk asymptomatic adults (age 60-75 years and LDL-C >=70 to <190 mg/dL) who do not have diabetes  or established atherosclerotic cardiovascular disease (ASCVD).* In intermediate-risk (10-year ASCVD risk >=7.5% to <20%) adults or selected borderline-risk (10-year ASCVD risk >=5% to <7.5%) adults in whom a CAC score is measured for the purpose of making a treatment decision the following recommendations have been made:  If CAC = 0, it is reasonable to withhold statin therapy and reassess in 5 to 10 years, as long as higher risk conditions are absent (diabetes mellitus, family history of premature CHD in first degree relatives (males <55 years; females <65 years), cigarette smoking, LDL >=190 mg/dL or other independent risk factors).  If CAC is 1 to 99, it is reasonable to initiate statin therapy for patients >=57 years of age.  If CAC is >=100 or >=75th percentile, it is reasonable to initiate statin therapy at any age.  Cardiology referral should be considered for patients with CAC scores =400 or >=75th percentile.  *2018 AHA/ACC/AACVPR/AAPA/ABC/ACPM/ADA/AGS/APhA/ASPC/NLA/PCNA Guideline on the Management of Blood Cholesterol: A Report of the American College of Cardiology/American Heart Association Task Force on Clinical Practice Guidelines. J Am Coll Cardiol. 2019;73(24):3168-3209.  Rudean Haskell, MD  Electronically Signed: By: Rudean Haskell M.D. On: 02/13/2022  19:19           Recent Labs: 11/23/2021: Magnesium 2.1 08/24/2022: ALT 13; BUN 9; Creatinine, Ser 0.87; Hemoglobin 12.6; Platelets 339; Potassium 4.1; Sodium 140  Recent Lipid Panel    Component Value Date/Time   CHOL 139 08/24/2022 1047   TRIG 87 08/24/2022 1047   HDL 52 08/24/2022 1047   CHOLHDL 2.7 08/24/2022 1047   LDLCALC 70 08/24/2022 1047       Physical Exam:    VS:  BP 116/68   Pulse 75   Ht 5\' 6"  (1.676 m)   Wt 152 lb (68.9 kg)   SpO2 98%   BMI 24.53 kg/m     Wt Readings from Last 3 Encounters:  09/14/22 152 lb (68.9 kg)  08/24/22 154 lb (69.9 kg)  07/12/22 151 lb (68.5 kg)   Gen: No distress  Neck: No JVD Cardiac: No Rubs or Gallops, no murmur, regular rhythm , +2 radial pulses Respiratory: Clear to auscultation bilaterally, normal effort, normal  respiratory rate GI: Soft, nontender, non-distended  MS: Trace bilateral  non pitting edema;  moves all extremities Integument: Skin feels warm Neuro:  At time of evaluation, alert and oriented to person/place/time/situation  Psych: Normal affect, patient feels well  ASSESSMENT:    1. Chest pain of uncertain etiology   2. Coronary artery calcification   3. Essential hypertension   4. Hyperlipidemia LDL goal <70     PLAN:    CAC and HLD Rule out Microvascular disease - stop norvasc start diltiazem 180 mg XL - may be non cardiac sx; will get PET MPI  - LDL goal < 70  Esophageal dysmotility - improved  on PPI  HTN - no changes outside of CCB change - if hypotension will drop dose  Summer follow up with our team          Medication Adjustments/Labs and Tests Ordered: Current medicines are reviewed at length with the patient today.  Concerns regarding medicines are outlined above.  Orders Placed This Encounter  Procedures   NM PET CT CARDIAC PERFUSION MULTI W/ABSOLUTE BLOODFLOW   Meds ordered this encounter  Medications   diltiazem (CARDIZEM CD) 180 MG 24 hr capsule    Sig: Take 1 capsule  (180 mg total) by mouth daily.    Dispense:  90 capsule  Refill:  3       Signed, Sierra Lean, MD  09/14/2022 9:12 AM     [facility]

## 2022-09-14 NOTE — Patient Instructions (Signed)
Medication Instructions:  Please discontinue your Amlodipine. Start Diltiazem ER 180 mg once a day. Continue all other medications as listed.  *If you need a refill on your cardiac medications before your next appointment, please call your pharmacy*   Lab Work: None today If you have labs (blood work) drawn today and your tests are completely normal, you will receive your results only by: [place] (if you have MyChart) OR A paper copy in the mail If you have any lab test that is abnormal or we need to change your treatment, we will call you to review the results.   Testing/Procedures: How to Prepare for Your Cardiac PET/CT Stress Test:  1. Please do not take these medications before your test:   Medications that may interfere with the cardiac pharmacological stress agent (ex. nitrates - including erectile dysfunction medications, isosorbide mononitrate, tamulosin or beta-blockers) the day of the exam. (Erectile dysfunction medication should be held for at least 72 hrs prior to test) Theophylline containing medications for 12 hours. Dipyridamole 48 hours prior to the test. Your remaining medications may be taken with water.  2. Nothing to eat or drink, except water, 3 hours prior to arrival time.   NO caffeine/decaffeinated products, or chocolate 12 hours prior to arrival.  3. NO perfume, cologne or lotion  4. Total time is 1 to 2 hours; you may want to bring reading material for the waiting time.  5. Please report to Radiology at the [facility] Main Entrance 30 minutes early for your test.  [place], [place] [address]  Diabetic Preparation:  Hold oral medications. You may take NPH and Lantus insulin. Do not take Humalog or Humulin R (Regular Insulin) the day of your test. Check blood sugars prior to leaving the house. If able to eat breakfast prior to 3 hour fasting, you may take all medications, including your insulin, Do not worry if you  miss your breakfast dose of insulin - start at your next meal.  IF YOU THINK YOU MAY BE PREGNANT, OR ARE NURSING PLEASE INFORM THE TECHNOLOGIST.  In preparation for your appointment, medication and supplies will be purchased.  Appointment availability is limited, so if you need to cancel or reschedule, please call the Radiology Department at [phone]  24 hours in advance to avoid a cancellation fee of $100.00  What to Expect After you Arrive:  Once you arrive and check in for your appointment, you will be taken to a preparation room within the Radiology Department.  A technologist or Nurse will obtain your medical history, verify that you are correctly prepped for the exam, and explain the procedure.  Afterwards,  an IV will be started in your arm and electrodes will be placed on your skin for EKG monitoring during the stress portion of the exam. Then you will be escorted to the PET/CT scanner.  There, staff will get you positioned on the scanner and obtain a blood pressure and EKG.  During the exam, you will continue to be connected to the EKG and blood pressure machines.  A small, safe amount of a radioactive tracer will be injected in your IV to obtain a series of pictures of your heart along with an injection of a stress agent.    After your Exam:  It is recommended that you eat a meal and drink a caffeinated beverage to counter act any effects of the stress agent.  Drink plenty of fluids for the remainder of the day and  urinate frequently for the first couple of hours after the exam.  Your doctor will inform you of your test results within 7-10 business days.  For questions about your test or how to prepare for your test, please call: Marchia Bond, Cardiac Imaging Nurse Navigator  Gordy Clement, Cardiac Imaging Nurse Navigator Office: [phone]   Follow-Up: At [facility], you and your health needs are our priority.  As part of our continuing mission to provide you with  exceptional heart care, we have created designated Provider Care Teams.  These Care Teams include your primary Cardiologist (physician) and Advanced Practice Providers (APPs -  Physician Assistants and Nurse Practitioners) who all work together to provide you with the care you need, when you need it.  We recommend signing up for the patient portal called "MyChart".  Sign up information is provided on this After Visit Summary.  MyChart is used to connect with patients for Virtual Visits (Telemedicine).  Patients are able to view lab/test results, encounter notes, upcoming appointments, etc.  Non-urgent messages can be sent to your provider as well.   To learn more about what you can do with MyChart, go to [email or web address].    Your next appointment:   6 month(s)  Provider:   Richardson Dopp, PA-C  or  Werner Lean, MD will plan to see you again in 6 month(s).

## 2022-09-18 ENCOUNTER — Encounter: Payer: Self-pay | Admitting: Internal Medicine

## 2022-09-19 ENCOUNTER — Other Ambulatory Visit: Payer: Self-pay

## 2022-09-19 MED ORDER — ATORVASTATIN CALCIUM 20 MG PO TABS: 20.0000 mg | ORAL_TABLET | Freq: Every day | ORAL | 3 refills | Status: DC

## 2022-09-23 ENCOUNTER — Encounter: Payer: Self-pay | Admitting: Internal Medicine

## 2022-09-25 MED ORDER — DILTIAZEM HCL ER 240 MG PO TB24: 240.0000 mg | ORAL_TABLET | Freq: Every day | ORAL | 3 refills | Status: DC

## 2022-09-26 MED ORDER — DILTIAZEM HCL ER 240 MG PO TB24: 240.0000 mg | ORAL_TABLET | Freq: Every day | ORAL | 3 refills | Status: DC

## 2022-09-26 NOTE — Addendum Note (Signed)
Addended by: Macie Burows on: 09/26/2022 08:07 AM   Modules accepted: Orders

## 2022-09-28 ENCOUNTER — Ambulatory Visit
Admission: RE | Admit: 2022-09-28 | Discharge: 2022-09-28 | Disposition: A | Discharge status: Home / Self Care | Payer: Medicare Other | Source: Ambulatory Visit | Attending: Family Medicine | Admitting: Family Medicine

## 2022-09-28 ENCOUNTER — Encounter: Payer: Self-pay | Admitting: Internal Medicine

## 2022-09-28 VITALS — BP 152/84 | HR 100 | Temp 98.8°F | Resp 18

## 2022-09-28 DIAGNOSIS — N939 Abnormal uterine and vaginal bleeding, unspecified: Secondary | ICD-10-CM

## 2022-09-28 DIAGNOSIS — R102 Pelvic and perineal pain: Secondary | ICD-10-CM | POA: Diagnosis not present

## 2022-09-28 LAB — POCT URINALYSIS DIP (MANUAL ENTRY)
Bilirubin, UA: NEGATIVE
Blood, UA: NEGATIVE
Glucose, UA: NEGATIVE mg/dL
Ketones, POC UA: NEGATIVE mg/dL
Nitrite, UA: NEGATIVE
Protein Ur, POC: NEGATIVE mg/dL
Spec Grav, UA: 1.02 (ref 1.010–1.025)
Urobilinogen, UA: 0.2 E.U./dL
pH, UA: 7 (ref 5.0–8.0)

## 2022-09-28 MED ORDER — CEPHALEXIN 500 MG PO CAPS: 500.0000 mg | ORAL_CAPSULE | Freq: Two times a day (BID) | ORAL | 0 refills | Status: DC

## 2022-09-28 NOTE — ED Triage Notes (Signed)
Noticed blood while wiping today.  States has had a pressure and burning feeling in that area.

## 2022-09-28 NOTE — Discharge Instructions (Signed)
You have had labs (urine culture) sent today. We will call you with any significant abnormalities or if there is need to begin or change treatment or pursue further follow up.  You may also review your test results online through MyChart. If you do not have a MyChart account, instructions to sign up should be on your discharge paperwork.  

## 2022-10-03 NOTE — ED Provider Notes (Signed)
[facility]    ASSESSMENT & PLAN:  1. Vaginal bleeding   2. Pelvic pressure in female    Urine culture pending. Does have symptoms that could possibly indicate UTI. Will tx empirically.  Labs Reviewed  POCT URINALYSIS DIP (MANUAL ENTRY) - Abnormal; Notable for the following components:      Result Value   Leukocytes, UA Trace (*)    All other components within normal limits    Meds ordered this encounter  Medications   cephALEXin (KEFLEX) 500 MG capsule    Sig: Take 1 capsule (500 mg total) by mouth 2 (two) times daily.    Dispense:  10 capsule    Refill:  0   H/O partial hysterectomy. Will arrange f/u with either her PCP or GYN to explore reported vaginal bleeding. No pelvic exam performed this evening after discussion and with prompt scheduling of follow up.  Agrees to ED with any worsening symptoms.  Outlined signs and symptoms indicating need for more acute intervention. Patient verbalized understanding. After Visit Summary given.  SUBJECTIVE:  Sierra Glover is a 67 y.o. female who noticed BRB while wiping today after urination. Feels very confident it came from her vagina. Has not recurred. States has had a pressure and burning feeling in that area; mild with urination. Without specific abdominal pain. Ambulatory without difficulty. Denies fever/n/v.   LMP: No LMP recorded. Patient has had a hysterectomy.  OBJECTIVE:  Vitals:   09/28/22 1826  BP: (!) 152/84  Pulse: 100  Resp: 18  Temp: 98.8 F (37.1 C)  TempSrc: Oral  SpO2: 95%   General appearance: alert; no distress Lungs: unlabored respirations Abdomen: soft Pelvic: declines Back: no CVA tenderness Extremities: no edema; symmetrical with no gross deformities Skin: warm and dry Neurologic: normal gait Psychological: alert and cooperative; normal mood and affect  Labs Reviewed  POCT URINALYSIS DIP (MANUAL ENTRY) - Abnormal; Notable for the following components:      Result  Value   Leukocytes, UA Trace (*)    All other components within normal limits    No Known Allergies  Past Medical History:  Diagnosis Date   Anemia    before my hysterectomy   Dizziness    DVT (deep venous thrombosis)    "Many, many years ago."   HTN (hypertension)    Nausea    Palpitation    Serum calcium elevated    Social History   Socioeconomic History   Marital status: Married    Spouse name: Not on file   Number of children: Not on file   Years of education: Not on file   Highest education level: Not on file  Occupational History   Not on file  Tobacco Use   Smoking status: Never   Smokeless tobacco: Never   Tobacco comments:    N/a  Vaping Use   Vaping Use: Never used  Substance and Sexual Activity   Alcohol use: Not Currently    Comment: social   Drug use: Never   Sexual activity: Yes  Other Topics Concern   Not on file  Social History Narrative   Not on file   Social Determinants of Health   Financial Resource Strain: Low Risk  (07/12/2022)   Overall Financial Resource Strain (CARDIA)    Difficulty of Paying Living Expenses: Not hard at all  Food Insecurity: No Food Insecurity (07/12/2022)   Hunger Vital Sign    Worried About Running Out of Food in the Last Year: Never true  Ran Out of Food in the Last Year: Never true  Transportation Needs: No Transportation Needs (07/12/2022)   PRAPARE - Administrator, Civil Service (Medical): No    Lack of Transportation (Non-Medical): No  Physical Activity: Inactive (07/12/2022)   Exercise Vital Sign    Days of Exercise per Week: 0 days    Minutes of Exercise per Session: 0 min  Stress: No Stress Concern Present (07/12/2022)   [employer] of Occupational Health - Occupational Stress Questionnaire    Feeling of Stress : Not at all  Social Connections: Not on file  Intimate Partner Violence: Not on file   Family History  Problem Relation Age of Onset   Alzheimer's disease Mother     Hypertension Mother    Hypertension Father    COPD Father    Hypertension Other         Mardella Layman, MD 10/03/22 1019

## 2022-11-20 ENCOUNTER — Telehealth (HOSPITAL_COMMUNITY): Payer: Self-pay | Admitting: Emergency Medicine

## 2022-11-20 NOTE — Telephone Encounter (Signed)
Reaching out to patient to offer assistance regarding upcoming cardiac imaging study; pt verbalizes understanding of appt date/time, parking situation and where to check in, pre-test NPO status and medications ordered, and verified current allergies; name and call back number provided for further questions should they arise Conroy Goracke RN Navigator Cardiac Imaging [facility] [phone] office [phone] cell 

## 2022-11-22 ENCOUNTER — Encounter (HOSPITAL_COMMUNITY)
Admission: RE | Admit: 2022-11-22 | Discharge: 2022-11-22 | Disposition: A | Discharge status: Home / Self Care | Payer: Medicare Other | Source: Ambulatory Visit | Attending: Internal Medicine | Admitting: Internal Medicine

## 2022-11-22 DIAGNOSIS — I2584 Coronary atherosclerosis due to calcified coronary lesion: Secondary | ICD-10-CM | POA: Insufficient documentation

## 2022-11-22 DIAGNOSIS — I2510 Atherosclerotic heart disease of native coronary artery without angina pectoris: Secondary | ICD-10-CM | POA: Diagnosis not present

## 2022-11-22 DIAGNOSIS — R079 Chest pain, unspecified: Secondary | ICD-10-CM | POA: Diagnosis not present

## 2022-11-22 LAB — NM PET CT CARDIAC PERFUSION MULTI W/ABSOLUTE BLOODFLOW
LV dias vol: 72 mL (ref 46–106)
MBFR: 2.74
Nuc Stress EF: 71 %
Rest MBF: 1.11 ml/g/min
Rest Nuclear Isotope Dose: 17.9 mCi

## 2022-11-22 MED ORDER — REGADENOSON 0.4 MG/5ML IV SOLN
0.4000 mg | Freq: Once | INTRAVENOUS | Status: AC
  Administered 2022-11-22: 0.4 mg via INTRAVENOUS

## 2022-11-22 MED ORDER — REGADENOSON 0.4 MG/5ML IV SOLN
INTRAVENOUS | Status: AC
  Filled 2022-11-22: qty 5

## 2022-11-22 MED ORDER — RUBIDIUM RB82 GENERATOR (RUBYFILL)
17.8400 | PACK | Freq: Once | INTRAVENOUS | Status: AC
  Administered 2022-11-22: 17.84 via INTRAVENOUS

## 2022-11-22 MED ORDER — RUBIDIUM RB82 GENERATOR (RUBYFILL)
17.8600 | PACK | Freq: Once | INTRAVENOUS | Status: AC
  Administered 2022-11-22: 17.86 via INTRAVENOUS

## 2022-11-23 LAB — NM PET CT CARDIAC PERFUSION MULTI W/ABSOLUTE BLOODFLOW
LV sys vol: 25 mL
Nuc Rest EF: 65 %
SDS: 10
ST Depression (mm): 0 mm
Stress MBF: 3.04 ml/g/min
Stress Nuclear Isotope Dose: 17.8 mCi

## 2023-01-11 ENCOUNTER — Other Ambulatory Visit: Payer: Self-pay | Admitting: Internal Medicine

## 2023-01-11 NOTE — Telephone Encounter (Signed)
Pt's pharmacy is requesting a refill on medication pantoprazole. Would Dr. Izora Ribas like to refill this medication? Please address

## 2023-01-23 ENCOUNTER — Other Ambulatory Visit: Payer: Self-pay | Admitting: Internal Medicine

## 2023-01-24 ENCOUNTER — Encounter: Payer: Self-pay | Admitting: Internal Medicine

## 2023-01-25 ENCOUNTER — Other Ambulatory Visit: Payer: Self-pay

## 2023-01-25 ENCOUNTER — Other Ambulatory Visit: Payer: Self-pay | Admitting: Internal Medicine

## 2023-01-25 MED ORDER — CICLOPIROX 8 % EX SOLN: Freq: Every day | CUTANEOUS | 0 refills | Status: DC

## 2023-01-29 ENCOUNTER — Other Ambulatory Visit: Payer: Self-pay

## 2023-01-29 MED ORDER — CICLOPIROX 8 % EX SOLN: Freq: Every day | CUTANEOUS | 0 refills | Status: DC

## 2023-01-30 ENCOUNTER — Other Ambulatory Visit: Payer: Self-pay | Admitting: Internal Medicine

## 2023-02-15 ENCOUNTER — Other Ambulatory Visit: Payer: Self-pay | Admitting: Internal Medicine

## 2023-02-15 DIAGNOSIS — I10 Essential (primary) hypertension: Secondary | ICD-10-CM

## 2023-02-20 ENCOUNTER — Encounter: Payer: Self-pay | Admitting: Pharmacist

## 2023-02-23 NOTE — Patient Instructions (Signed)
Hypertension, Adult High blood pressure (hypertension) is when the force of blood pumping through the arteries is too strong. The arteries are the blood vessels that carry blood from the heart throughout the body. Hypertension forces the heart to work harder to pump blood and may cause arteries to become narrow or stiff. Untreated or uncontrolled hypertension can lead to a heart attack, heart failure, a stroke, kidney disease, and other problems. A blood pressure reading consists of a higher number over a lower number. Ideally, your blood pressure should be below 120/80. The first ("top") number is called the systolic pressure. It is a measure of the pressure in your arteries as your heart beats. The second ("bottom") number is called the diastolic pressure. It is a measure of the pressure in your arteries as the heart relaxes. What are the causes? The exact cause of this condition is not known. There are some conditions that result in high blood pressure. What increases the risk? Certain factors may make you more likely to develop high blood pressure. Some of these risk factors are under your control, including: Smoking. Not getting enough exercise or physical activity. Being overweight. Having too much fat, sugar, calories, or salt (sodium) in your diet. Drinking too much alcohol. Other risk factors include: Having a personal history of heart disease, diabetes, high cholesterol, or kidney disease. Stress. Having a family history of high blood pressure and high cholesterol. Having obstructive sleep apnea. Age. The risk increases with age. What are the signs or symptoms? High blood pressure may not cause symptoms. Very high blood pressure (hypertensive crisis) may cause: Headache. Fast or irregular heartbeats (palpitations). Shortness of breath. Nosebleed. Nausea and vomiting. Vision changes. Severe chest pain, dizziness, and seizures. How is this diagnosed? This condition is diagnosed by  measuring your blood pressure while you are seated, with your arm resting on a flat surface, your legs uncrossed, and your feet flat on the floor. The cuff of the blood pressure monitor will be placed directly against the skin of your upper arm at the level of your heart. Blood pressure should be measured at least twice using the same arm. Certain conditions can cause a difference in blood pressure between your right and left arms. If you have a high blood pressure reading during one visit or you have normal blood pressure with other risk factors, you may be asked to: Return on a different day to have your blood pressure checked again. Monitor your blood pressure at home for 1 week or longer. If you are diagnosed with hypertension, you may have other blood or imaging tests to help your health care provider understand your overall risk for other conditions. How is this treated? This condition is treated by making healthy lifestyle changes, such as eating healthy foods, exercising more, and reducing your alcohol intake. You may be referred for counseling on a healthy diet and physical activity. Your health care provider may prescribe medicine if lifestyle changes are not enough to get your blood pressure under control and if: Your systolic blood pressure is above 130. Your diastolic blood pressure is above 80. Your personal target blood pressure may vary depending on your medical conditions, your age, and other factors. Follow these instructions at home: Eating and drinking  Eat a diet that is high in fiber and potassium, and low in sodium, added sugar, and fat. An example of this eating plan is called the DASH diet. DASH stands for Dietary Approaches to Stop Hypertension. To eat this way: Eat   plenty of fresh fruits and vegetables. Try to fill one half of your plate at each meal with fruits and vegetables. Eat whole grains, such as whole-wheat pasta, brown rice, or whole-grain bread. Fill about one  fourth of your plate with whole grains. Eat or drink low-fat dairy products, such as skim milk or low-fat yogurt. Avoid fatty cuts of meat, processed or cured meats, and poultry with skin. Fill about one fourth of your plate with lean proteins, such as fish, chicken without skin, beans, eggs, or tofu. Avoid pre-made and processed foods. These tend to be higher in sodium, added sugar, and fat. Reduce your daily sodium intake. Many people with hypertension should eat less than 1,500 mg of sodium a day. Do not drink alcohol if: Your health care provider tells you not to drink. You are pregnant, may be pregnant, or are planning to become pregnant. If you drink alcohol: Limit how much you have to: 0-1 drink a day for women. 0-2 drinks a day for men. Know how much alcohol is in your drink. In the [place] one drink equals one 12 oz bottle of beer (355 mL), one 5 oz glass of wine (148 mL), or one 1 oz glass of hard liquor (44 mL). Lifestyle  Work with your health care provider to maintain a healthy body weight or to lose weight. Ask what an ideal weight is for you. Get at least 30 minutes of exercise that causes your heart to beat faster (aerobic exercise) most days of the week. Activities may include walking, swimming, or biking. Include exercise to strengthen your muscles (resistance exercise), such as Pilates or lifting weights, as part of your weekly exercise routine. Try to do these types of exercises for 30 minutes at least 3 days a week. Do not use any products that contain nicotine or tobacco. These products include cigarettes, chewing tobacco, and vaping devices, such as e-cigarettes. If you need help quitting, ask your health care provider. Monitor your blood pressure at home as told by your health care provider. Keep all follow-up visits. This is important. Medicines Take over-the-counter and prescription medicines only as told by your health care provider. Follow directions carefully. Blood  pressure medicines must be taken as prescribed. Do not skip doses of blood pressure medicine. Doing this puts you at risk for problems and can make the medicine less effective. Ask your health care provider about side effects or reactions to medicines that you should watch for. Contact a health care provider if you: Think you are having a reaction to a medicine you are taking. Have headaches that keep coming back (recurring). Feel dizzy. Have swelling in your ankles. Have trouble with your vision. Get help right away if you: Develop a severe headache or confusion. Have unusual weakness or numbness. Feel faint. Have severe pain in your chest or abdomen. Vomit repeatedly. Have trouble breathing. These symptoms may be an emergency. Get help right away. Call 911. Do not wait to see if the symptoms will go away. Do not drive yourself to the hospital. Summary Hypertension is when the force of blood pumping through your arteries is too strong. If this condition is not controlled, it may put you at risk for serious complications. Your personal target blood pressure may vary depending on your medical conditions, your age, and other factors. For most people, a normal blood pressure is less than 120/80. Hypertension is treated with lifestyle changes, medicines, or a combination of both. Lifestyle changes include losing weight, eating a healthy,   low-sodium diet, exercising more, and limiting alcohol. This information is not intended to replace advice given to you by your health care provider. Make sure you discuss any questions you have with your health care provider. Document Revised: 04/12/2021 Document Reviewed: 04/12/2021 Elsevier Patient Education  2024 Elsevier Inc.  

## 2023-02-26 ENCOUNTER — Encounter: Payer: Self-pay | Admitting: Internal Medicine

## 2023-02-26 ENCOUNTER — Ambulatory Visit: Payer: Medicare Other | Admitting: Internal Medicine

## 2023-02-26 VITALS — BP 130/80 | HR 65 | Temp 98.3°F | Ht 66.0 in | Wt 156.0 lb

## 2023-02-26 DIAGNOSIS — Z79899 Other long term (current) drug therapy: Secondary | ICD-10-CM

## 2023-02-26 DIAGNOSIS — I10 Essential (primary) hypertension: Secondary | ICD-10-CM | POA: Diagnosis not present

## 2023-02-26 DIAGNOSIS — K219 Gastro-esophageal reflux disease without esophagitis: Secondary | ICD-10-CM

## 2023-02-26 DIAGNOSIS — E7800 Pure hypercholesterolemia, unspecified: Secondary | ICD-10-CM | POA: Diagnosis not present

## 2023-02-26 DIAGNOSIS — Z862 Personal history of diseases of the blood and blood-forming organs and certain disorders involving the immune mechanism: Secondary | ICD-10-CM

## 2023-02-26 MED ORDER — VALSARTAN-HYDROCHLOROTHIAZIDE 160-12.5 MG PO TABS
1.0000 | ORAL_TABLET | Freq: Every day | ORAL | 2 refills | Status: DC
Start: 2023-02-26 — End: 2023-07-19

## 2023-02-26 NOTE — Progress Notes (Unsigned)
I,[name], CMA,acting as a Neurosurgeon for [name], MD.,have documented all relevant documentation on the behalf of [name], MD,as directed by  [name], MD while in the presence of [name], MD.  Subjective:  Patient ID: Sierra Glover , female    DOB: 1955-06-23 , 67 y.o.   MRN: [id number]  Chief Complaint  Patient presents with   Hypertension   Hyperlipidemia    HPI  Patient presents today for a bp check. Patient reports compliance with her meds. She denies having headaches, shortness of breath and chest pain. She reports having no specific questions or concerns.   Hypertension This is a chronic problem. The current episode started more than 1 year ago. The problem has been gradually improving since onset. The problem is controlled. Pertinent negatives include no blurred vision. Past treatments include diuretics. The current treatment provides moderate improvement.     Past Medical History:  Diagnosis Date   Anemia    before my hysterectomy   Dizziness    DVT (deep venous thrombosis) (HCC)    "Many, many years ago."   HTN (hypertension)    Nausea    Palpitation    Serum calcium elevated      Family History  Problem Relation Age of Onset   Alzheimer's disease Mother    Hypertension Mother    Hypertension Father    COPD Father    Hypertension Other      Current Outpatient Medications:    atorvastatin (LIPITOR) 20 MG tablet, Take 1 tablet (20 mg total) by mouth daily., Disp: 90 tablet, Rfl: 3   B Complex Vitamins (VITAMIN B COMPLEX PO), Take by mouth., Disp: , Rfl:    ciclopirox (PENLAC) 8 % solution, Apply topically at bedtime. Apply over nail and surrounding skin. Apply daily over previous coat. After seven (7) days, may remove with alcohol and continue cycle., Disp: 6.6 mL, Rfl: 0   diazepam (VALIUM) 2 MG tablet, One tab po 1hr prior to procedure, repeat if needed, Disp: 2 tablet, Rfl: 0   diltiazem (CARDIZEM LA) 240 MG 24 hr  tablet, Take 1 tablet (240 mg total) by mouth daily., Disp: 90 tablet, Rfl: 3   fluticasone (FLONASE) 50 MCG/ACT nasal spray, PLACE 2 SPRAYS INTO BOTH NOSTRILS DAILY AS NEEDED FOR ALLERGIES OR RHINITIS., Disp: 48 mL, Rfl: 1   ondansetron (ZOFRAN ODT) 8 MG disintegrating tablet, Take 1 tablet (8 mg total) by mouth every 8 (eight) hours as needed for nausea or vomiting., Disp: 20 tablet, Rfl: 0   pantoprazole (PROTONIX) 40 MG tablet, Take 1 tablet (40 mg total) by mouth daily., Disp: 90 tablet, Rfl: 3   valACYclovir (VALTREX) 500 MG tablet, take 1 tablet by oral route  every day (Patient taking differently: as needed (cold sores). take 1 tablet by oral route  every day), Disp: 90 tablet, Rfl: 0   Zinc Oxide-Vitamin C (ZINC PLUS VITAMIN C PO), Take by mouth., Disp: , Rfl:    loratadine (CLARITIN) 10 MG tablet, Take 1 tablet (10 mg total) by mouth daily. (Patient taking differently: Take 10 mg by mouth as needed for allergies.), Disp: 30 tablet, Rfl: 2   meclizine (ANTIVERT) 25 MG tablet, Take 1 tablet (25 mg total) by mouth 2 (two) times daily as needed for dizziness., Disp: 30 tablet, Rfl: 0   valsartan-hydrochlorothiazide (DIOVAN-HCT) 160-12.5 MG tablet, Take 1 tablet by mouth daily., Disp: 90 tablet, Rfl: 2   No Known Allergies   Review  of Systems  Constitutional: Negative.   Eyes:  Negative for blurred vision.  Respiratory: Negative.    Cardiovascular: Negative.   Gastrointestinal: Negative.   Skin: Negative.   Neurological: Negative.   Psychiatric/Behavioral: Negative.       Today's Vitals   02/26/23 0838  BP: 130/80  Pulse: 65  Temp: 98.3 F (36.8 C)  SpO2: 99%  Weight: 156 lb (70.8 kg)  Height: 5\' 6"  (1.676 m)   Body mass index is 25.18 kg/m.  Wt Readings from Last 3 Encounters:  02/26/23 156 lb (70.8 kg)  09/14/22 152 lb (68.9 kg)  08/24/22 154 lb (69.9 kg)     Objective:  Physical Exam Vitals and nursing note reviewed.  Constitutional:      Appearance: Normal  appearance.  HENT:     Head: Normocephalic and atraumatic.  Eyes:     Extraocular Movements: Extraocular movements intact.  Cardiovascular:     Rate and Rhythm: Normal rate and regular rhythm.     Heart sounds: Normal heart sounds.  Pulmonary:     Effort: Pulmonary effort is normal.     Breath sounds: Normal breath sounds.  Musculoskeletal:     Cervical back: Normal range of motion.  Skin:    General: Skin is warm.  Neurological:     General: No focal deficit present.     Mental Status: She is alert.  Psychiatric:        Mood and Affect: Mood normal.        Behavior: Behavior normal.         Assessment And Plan:  Essential hypertension, benign Assessment & Plan: Chronic, fair control. Goal BP<120/80.  She will continue with diltiazem 240mg  daily and valsartan/hct 160/12.5mg  daily. Encouraged to follow low sodium diet.   Orders: -     CMP14+EGFR -     Valsartan-hydroCHLOROthiazide; Take 1 tablet by mouth daily.  Dispense: 90 tablet; Refill: 2  Pure hypercholesterolemia Assessment & Plan: Chronic, currently on atorvastatin 20mg  daily. She is encouraged to follow a heart healthy lifestyle. She will f/u in 6 months, also followed by Cardiology.   Orders: -     CMP14+EGFR -     Lipid panel  Gastroesophageal reflux disease without esophagitis Assessment & Plan: Chronic, sx improved while on PPI therapy. Reminded to stop eating 3 hours prior to lying down.    Drug therapy -     Vitamin B12     Return if symptoms worsen or fail to improve.  Patient was given opportunity to ask questions. Patient verbalized understanding of the plan and was able to repeat key elements of the plan. All questions were answered to their satisfaction.    I, [name], MD, have reviewed all documentation for this visit. The documentation on 02/26/23 for the exam, diagnosis, procedures, and orders are all accurate and complete.   IF YOU HAVE BEEN REFERRED TO A SPECIALIST, IT MAY TAKE  1-2 WEEKS TO SCHEDULE/PROCESS THE REFERRAL. IF YOU HAVE NOT HEARD FROM US/SPECIALIST IN TWO WEEKS, PLEASE GIVE [place] A CALL AT [phone].   THE PATIENT IS ENCOURAGED TO PRACTICE SOCIAL DISTANCING DUE TO THE COVID-19 PANDEMIC.

## 2023-02-27 DIAGNOSIS — K219 Gastro-esophageal reflux disease without esophagitis: Secondary | ICD-10-CM | POA: Insufficient documentation

## 2023-02-27 LAB — LIPID PANEL
Chol/HDL Ratio: 3 ratio (ref 0.0–4.4)
Cholesterol, Total: 149 mg/dL (ref 100–199)
HDL: 49 mg/dL (ref >39)
LDL Chol Calc (NIH): 84 mg/dL (ref 0–99)
Triglycerides: 83 mg/dL (ref 0–149)
VLDL Cholesterol Cal: 16 mg/dL (ref 5–40)

## 2023-02-27 LAB — CMP14+EGFR
ALT: 17 IU/L (ref 0–32)
AST: 22 IU/L (ref 0–40)
Albumin: 4.4 g/dL (ref 3.9–4.9)
Alkaline Phosphatase: 133 IU/L — ABNORMAL HIGH (ref 44–121)
BUN/Creatinine Ratio: 11 — ABNORMAL LOW (ref 12–28)
BUN: 10 mg/dL (ref 8–27)
Bilirubin Total: 0.7 mg/dL (ref 0.0–1.2)
CO2: 25 mmol/L (ref 20–29)
Calcium: 9.7 mg/dL (ref 8.7–10.3)
Chloride: 98 mmol/L (ref 96–106)
Creatinine, Ser: 0.93 mg/dL (ref 0.57–1.00)
Globulin, Total: 3 g/dL (ref 1.5–4.5)
Glucose: 89 mg/dL (ref 70–99)
Potassium: 3.9 mmol/L (ref 3.5–5.2)
Sodium: 138 mmol/L (ref 134–144)
Total Protein: 7.4 g/dL (ref 6.0–8.5)
eGFR: 67 mL/min/{1.73_m2} (ref >59)

## 2023-02-27 LAB — VITAMIN B12: Vitamin B-12: 731 pg/mL (ref 232–1245)

## 2023-02-27 NOTE — Assessment & Plan Note (Signed)
Chronic, currently on atorvastatin 20mg  daily. She is encouraged to follow a heart healthy lifestyle. She will f/u in 6 months, also followed by Cardiology.

## 2023-02-27 NOTE — Assessment & Plan Note (Signed)
Chronic, sx improved while on PPI therapy. Reminded to stop eating 3 hours prior to lying down.

## 2023-02-27 NOTE — Assessment & Plan Note (Signed)
Chronic, fair control. Goal BP<120/80.  She will continue with diltiazem 240mg  daily and valsartan/hct 160/12.5mg  daily. Encouraged to follow low sodium diet.

## 2023-02-28 ENCOUNTER — Encounter: Payer: Self-pay | Admitting: Internal Medicine

## 2023-03-16 ENCOUNTER — Ambulatory Visit: Payer: Medicare Other | Admitting: Physician Assistant

## 2023-03-20 ENCOUNTER — Encounter: Payer: Self-pay | Admitting: Internal Medicine

## 2023-03-20 ENCOUNTER — Ambulatory Visit: Payer: Medicare Other | Attending: Internal Medicine | Admitting: Internal Medicine

## 2023-03-20 VITALS — BP 138/78 | HR 64 | Ht 66.0 in | Wt 157.0 lb

## 2023-03-20 DIAGNOSIS — I10 Essential (primary) hypertension: Secondary | ICD-10-CM

## 2023-03-20 DIAGNOSIS — I700 Atherosclerosis of aorta: Secondary | ICD-10-CM

## 2023-03-20 DIAGNOSIS — I2510 Atherosclerotic heart disease of native coronary artery without angina pectoris: Secondary | ICD-10-CM

## 2023-03-20 DIAGNOSIS — E785 Hyperlipidemia, unspecified: Secondary | ICD-10-CM | POA: Diagnosis not present

## 2023-03-20 NOTE — Progress Notes (Signed)
Cardiology Office Note:    Date:  03/20/2023   ID:  Sierra Glover, DOB 03/22/1956, MRN [id number]  PCP:  Dorothyann Peng, MD   [facility] Providers Cardiologist:  Christell Constant, MD     Referring MD: Dorothyann Peng, MD   CC: Follow up secondary prevention  History of Present Illness:    Sierra Glover is a 67 y.o. female with a hx of HTN, Remote DVT, who presents for evaluation 06/23/21. 2023: negative stress test.  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  Normal heart monitor.  Started PPI 2024: negative PET MPI.  Discussed the use of AI scribe software for clinical note transcription with the patient, who gave verbal consent to proceed.  History of Present Illness         Sierra Glover has been doing well since last visit. She had a PET perfusion imaging test in June of the current year, which showed normal global myocardial blood flow reserve, less suggestive of a true infarct. She is chest pain free.Marland Kitchen Her LDL cholesterol is slightly elevated at 84, despite being on atorvastatin 20mg . She has a history of long COVID, which has affected her ability to exercise regularly. She reports feeling dizzy during exercise and has had to modify her exercise routine to non-positional work. She also reports some dietary inconsistencies, with some days of eating healthily and others not. She expresses concern about recent weight gain, particularly in her stomach area.  Past Medical History:  Diagnosis Date   Anemia    before my hysterectomy   Dizziness    DVT (deep venous thrombosis) (HCC)    "Many, many years ago."   HTN (hypertension)    Nausea    Palpitation    Serum calcium elevated     Past Surgical History:  Procedure Laterality Date   COLONOSCOPY     partal hysterectomy  2009   TIBIA IM NAIL INSERTION Right 01/07/2021   Procedure: INTRAMEDULLARY (IM) NAIL TIBIAL;  Surgeon: Roby Lofts, MD;   Location: [facility] OR;  Service: Orthopedics;  Laterality: Right;    Current Medications: Current Meds  Medication Sig   atorvastatin (LIPITOR) 20 MG tablet Take 1 tablet (20 mg total) by mouth daily.   B Complex Vitamins (VITAMIN B COMPLEX PO) Take by mouth daily at 6 (six) AM.   ciclopirox (PENLAC) 8 % solution Apply topically at bedtime. Apply over nail and surrounding skin. Apply daily over previous coat. After seven (7) days, may remove with alcohol and continue cycle.   diazepam (VALIUM) 2 MG tablet One tab po 1hr prior to procedure, repeat if needed   diltiazem (CARDIZEM LA) 240 MG 24 hr tablet Take 1 tablet (240 mg total) by mouth daily.   fluticasone (FLONASE) 50 MCG/ACT nasal spray PLACE 2 SPRAYS INTO BOTH NOSTRILS DAILY AS NEEDED FOR ALLERGIES OR RHINITIS.   loratadine (CLARITIN) 10 MG tablet Take 1 tablet (10 mg total) by mouth daily. (Patient taking differently: Take 10 mg by mouth as needed for allergies.)   meclizine (ANTIVERT) 25 MG tablet Take 1 tablet (25 mg total) by mouth 2 (two) times daily as needed for dizziness.   ondansetron (ZOFRAN ODT) 8 MG disintegrating tablet Take 1 tablet (8 mg total) by mouth every 8 (eight) hours as needed for nausea or vomiting.   pantoprazole (PROTONIX) 40 MG tablet Take 1 tablet (40 mg total) by mouth daily.   potassium chloride SA (KLOR-CON M20) 20  MEQ tablet as needed (low potassium).   valACYclovir (VALTREX) 500 MG tablet take 1 tablet by oral route  every day (Patient taking differently: as needed (cold sores). take 1 tablet by oral route  every day)   valsartan-hydrochlorothiazide (DIOVAN-HCT) 160-12.5 MG tablet Take 1 tablet by mouth daily.   Zinc Oxide-Vitamin C (ZINC PLUS VITAMIN C PO) Take by mouth as needed.     Allergies:   Patient has no known allergies.   Social History   Socioeconomic History   Marital status: Married    Spouse name: Not on file   Number of children: Not on file   Years of education: Not on file   Highest  education level: Not on file  Occupational History   Not on file  Tobacco Use   Smoking status: Never   Smokeless tobacco: Never   Tobacco comments:    N/a  Vaping Use   Vaping status: Never Used  Substance and Sexual Activity   Alcohol use: Not Currently    Comment: social   Drug use: Never   Sexual activity: Yes  Other Topics Concern   Not on file  Social History Narrative   Not on file   Social Determinants of Health   Financial Resource Strain: Low Risk  (07/12/2022)   Overall Financial Resource Strain (CARDIA)    Difficulty of Paying Living Expenses: Not hard at all  Food Insecurity: No Food Insecurity (07/12/2022)   Hunger Vital Sign    Worried About Running Out of Food in the Last Year: Never true    Ran Out of Food in the Last Year: Never true  Transportation Needs: No Transportation Needs (07/12/2022)   PRAPARE - Administrator, Civil Service (Medical): No    Lack of Transportation (Non-Medical): No  Physical Activity: Inactive (07/12/2022)   Exercise Vital Sign    Days of Exercise per Week: 0 days    Minutes of Exercise per Session: 0 min  Stress: No Stress Concern Present (07/12/2022)   [employer] of Occupational Health - Occupational Stress Questionnaire    Feeling of Stress : Not at all  Social Connections: Not on file    Family History: The patient's family history includes Alzheimer's disease in her mother; COPD in her father; Hypertension in her father, mother, and another family member. No family history of heart disease.  ROS:   Please see the history of present illness.     All other systems reviewed and are negative.  EKGs/Labs/Other Studies Reviewed:    The following studies were reviewed today:  EKG:   06/23/20: SR rate 74 WNL  Cardiac Studies & Procedures     STRESS TESTS  NM PET CT CARDIAC PERFUSION MULTI W/ABSOLUTE BLOODFLOW 11/22/2022  Narrative   LV perfusion is abnormal. Defect 1: There is a medium defect with  moderate reduction in uptake present in the basal anterolateral location(s) that is fixed. There is normal wall motion in the defect area. Consistent with artifact.   Rest left ventricular function is normal. Rest EF: 65 %. Stress left ventricular function is normal. Stress EF: 71 %. End diastolic cavity size is normal. End systolic cavity size is normal.   Myocardial blood flow was computed to be 1.32ml/g/min at rest and 3.73ml/g/min at stress. Global myocardial blood flow reserve was 2.74 and was normal.   Coronary calcium was present on the attenuation correction CT images. Mild coronary calcifications were present. Coronary calcifications were present in the left anterior descending artery  distribution(s).   Findings are consistent with no ischemia. Findings are equivocal. The study is low risk.   Low risk study and likely normal Tech will reprocess for mis alignment Discussed with Dr Izora Ribas and Oneal anterior lateral defect on rest/stress imaging likely artifact given limited calcium, no RWMA normal EF and normal MBF  CLINICAL DATA:  This over-read does not include interpretation of cardiac or coronary anatomy or pathology. The Cardiac PET CT interpretation by the cardiologist is attached.  COMPARISON:  None Available.  FINDINGS: Vascular: Normal heart size. No pericardial effusion. Normal caliber thoracic aorta with mild calcified plaque. Mild coronary artery calcifications.  Mediastinum/Nodes: Esophagus is unremarkable. No enlarged lymph nodes seen in the chest.  Lungs/Pleura: Central airways are patent. No consolidation, pleural effusion or pneumothorax.  Upper Abdomen: No acute abnormality.  Musculoskeletal: No aggressive appearing osseous lesions.  IMPRESSION: 1. No acute extracardiac abnormality. 2. Mild aortic Atherosclerosis (ICD10-I70.0).   Electronically Signed By: Allegra Lai M.D. On: 11/22/2022 10:37     MONITORS  LONG TERM MONITOR (3-14 DAYS)  07/25/2021  Narrative  Patient had a minimum heart rate of 47 bpm, maximum heart rate of 165 bpm, and average heart rate of 74 bpm.  Predominant underlying rhythm was sinus rhythm.  Isolated PACs were rare (<1.0%).  Isolated PVCs were rare (<1.0%).  No triggered events.  No malignant arrhythmias.   CT SCANS  CT CORONARY MORPH W/CTA COR W/SCORE 02/13/2022  Addendum 02/14/2022 10:00 AM ADDENDUM REPORT: 02/14/2022 09:58  EXAM: OVER-READ INTERPRETATION  CT CHEST  The following report is an over-read performed by radiologist Dr. Schuyler Amor [facility] on 02/14/2022. This over-read does not include interpretation of cardiac or coronary anatomy or pathology. The coronary CTA interpretation by the cardiologist is attached.  COMPARISON:  None.  FINDINGS: Vascular: No acute abnormality.  Mediastinum/nodes: No mass or adenopathy identified. Patulous esophagus may reflect underlying dysmotility.  Lungs/pleura: No pleural effusion, airspace consolidation, or pneumothorax. No suspicious lung nodules identified.  Upper abdomen: No acute abnormality.  Musculoskeletal: No chest wall mass. No acute or suspicious osseous findings.  IMPRESSION: 1. No significant noncardiac supplemental findings. 2. Patulous esophagus, which may reflect underlying dysmotility.   Electronically Signed By: Signa Kell M.D. On: 02/14/2022 09:58  Narrative CLINICAL DATA:  67 Year old African American Female  EXAM: Cardiac/Coronary  CTA  TECHNIQUE: The patient was scanned on a [employer].  FINDINGS: Scan was triggered in the descending thoracic aorta. Axial non-contrast 3 mm slices were carried out through the heart. The data set was analyzed on a dedicated work station and scored using the Agatson method. Gantry rotation speed was 250 msecs and collimation was .6 mm. 0.8 mg of sl NTG was given. The 3D data set was reconstructed in 5% intervals of the 67-82 %  of the R-R cycle. Diastolic phases were analyzed on a dedicated work station using MPR, MIP and VRT modes. The patient received 200 cc of contrast- due to patient breathing artifact affecting RCA, repeat scan with breath hold. Artifact resolves on second scan.  Coronary Arteries:  Normal coronary origin.  Right dominance.  Coronary Calcium Score:  Left main: 0  Left anterior descending artery: 27  Left circumflex artery: 0  Right coronary artery: 0  Total: 27  Percentile: 73rd for age, sex, and race matched control.  Plaque Analysis:  Left main: 0  Left anterior descending artery: 1 cubic mm calcified plaque, 10 cubic mm non calcified plaque, no low attenuation plaque.  Left circumflex artery:9 cubic mm non calcified plaque, no low attenuation plaque.  Right coronary artery: 0  Total: 27  Percentile: 73rd for age, sex, and race matched control.  RCA is a large dominant artery that gives rise to PDA and PLA. There is no significant plaque.  Left main is a large artery that gives rise to LAD and LCX arteries. There is no significant plaque.  LAD is a large vessel that gives rise to one large D1 Branch that bifurcates. There is minimal non obstructive calcified plaque (1-24%) in the proximal LAD.  LCX is a non-dominant artery that gives rise to one large OM1 branch that bifurcates. There is no significant plaque.  Other findings:  Aorta: Normal size.  No calcifications.  No dissection.  Main Pulmonary Artery: Normal size of the pulmonary artery.  Aortic Valve:  Tri-leaflet.  No calcifications.  Normal pulmonary vein drainage into the left atrium.  Normal left atrial appendage without a thrombus.  Extra-cardiac findings: See attached radiology report for non-cardiac structures.  IMPRESSION: 1. Coronary calcium score of 27. This was 73rd percentile for age, sex, and race matched control.  2. Normal coronary origin with right dominance.  3. CAD-RADS  1. Minimal non-obstructive CAD (1-24%). Consider non-atherosclerotic causes of chest pain. Consider preventive therapy and risk factor modification.  RECOMMENDATIONS:  Coronary artery calcium (CAC) score is a strong predictor of incident coronary heart disease (CHD) and provides predictive information beyond traditional risk factors. CAC scoring is reasonable to use in the decision to withhold, postpone, or initiate statin therapy in intermediate-risk or selected borderline-risk asymptomatic adults (age 40-75 years and LDL-C >=70 to <190 mg/dL) who do not have diabetes or established atherosclerotic cardiovascular disease (ASCVD).* In intermediate-risk (10-year ASCVD risk >=7.5% to <20%) adults or selected borderline-risk (10-year ASCVD risk >=5% to <7.5%) adults in whom a CAC score is measured for the purpose of making a treatment decision the following recommendations have been made:  If CAC = 0, it is reasonable to withhold statin therapy and reassess in 5 to 10 years, as long as higher risk conditions are absent (diabetes mellitus, family history of premature CHD in first degree relatives (males <55 years; females <65 years), cigarette smoking, LDL >=190 mg/dL or other independent risk factors).  If CAC is 1 to 99, it is reasonable to initiate statin therapy for patients >=48 years of age.  If CAC is >=100 or >=75th percentile, it is reasonable to initiate statin therapy at any age.  Cardiology referral should be considered for patients with CAC scores =400 or >=75th percentile.  *2018 AHA/ACC/AACVPR/AAPA/ABC/ACPM/ADA/AGS/APhA/ASPC/NLA/PCNA Guideline on the Management of Blood Cholesterol: A Report of the American College of Cardiology/American Heart Association Task Force on Clinical Practice Guidelines. J Am Coll Cardiol. 2019;73(24):3168-3209.  Riley Lam, MD  Electronically Signed: By: Riley Lam M.D. On: 02/13/2022 19:19           Recent  Labs: 08/24/2022: Hemoglobin 12.6; Platelets 339 02/26/2023: ALT 17; BUN 10; Creatinine, Ser 0.93; Potassium 3.9; Sodium 138  Recent Lipid Panel    Component Value Date/Time   CHOL 149 02/26/2023 0914   TRIG 83 02/26/2023 0914   HDL 49 02/26/2023 0914   CHOLHDL 3.0 02/26/2023 0914   LDLCALC 84 02/26/2023 0914       Physical Exam:    VS:  BP 138/78   Pulse 64   Ht 5\' 6"  (1.676 m)   Wt 157 lb (71.2 kg)   SpO2 98%   BMI 25.34 kg/m  Wt Readings from Last 3 Encounters:  03/20/23 157 lb (71.2 kg)  02/26/23 156 lb (70.8 kg)  09/14/22 152 lb (68.9 kg)   Gen: No distress  Neck: No JVD Cardiac: No Rubs or Gallops, no murmur, regular rhythm , +2 radial pulses Respiratory: Clear to auscultation bilaterally, normal effort, normal  respiratory rate GI: Soft, nontender, non-distended  MS: Trace bilateral  non pitting edema;  moves all extremities Integument: Skin feels warm Neuro:  At time of evaluation, alert and oriented to person/place/time/situation  Psych: Normal affect, patient feels well  ASSESSMENT:    1. Coronary artery calcification   2. Hyperlipidemia LDL goal <70   3. Aortic atherosclerosis (HCC)   4. Essential hypertension, benign      PLAN:    Non obstructive CAD without microvascular dysfunction Aortic atherosclerosis Hyperlipidemia - LDL slightly elevated at 84 while on Atorvastatin 20mg . Discussed the need for further risk stratification and potential increase in Atorvastatin dose. -Encourage lifestyle modifications including dietary changes and increased exercise. - we created a Mediterranean diet plan that patient would eat and make that is cost effective -Check lipid panel in 3 months (January 2025) to assess response to lifestyle modifications with ALT and Lp(a) -Consider increasing Atorvastatin dose if LDL remains elevated at next check.  Esophageal dysmotility -treated on PPI  HTN - controlled on current therapy  One year with me           Medication Adjustments/Labs and Tests Ordered: Current medicines are reviewed at length with the patient today.  Concerns regarding medicines are outlined above.  Orders Placed This Encounter  Procedures   Lipid panel   ALT   No orders of the defined types were placed in this encounter.      Signed, Christell Constant, MD  03/20/2023 2:54 PM    [facility]

## 2023-03-20 NOTE — Patient Instructions (Addendum)
Medication Instructions:  Your physician recommends that you continue on your current medications as directed. Please refer to the Current Medication list given to you today.  *If you need a refill on your cardiac medications before your next appointment, please call your pharmacy*   Lab Work: JAN 2025: FLP, ALT (nothing to eat or drink 12 hours prior except water)   If you have labs (blood work) drawn today and your tests are completely normal, you will receive your results only by: MyChart Message (if you have MyChart) OR A paper copy in the mail If you have any lab test that is abnormal or we need to change your treatment, we will call you to review the results.   Testing/Procedures: NONE   Follow-Up: At [facility], you and your health needs are our priority.  As part of our continuing mission to provide you with exceptional heart care, we have created designated Provider Care Teams.  These Care Teams include your primary Cardiologist (physician) and Advanced Practice Providers (APPs -  Physician Assistants and Nurse Practitioners) who all work together to provide you with the care you need, when you need it.    Your next appointment:   1 year(s)  Provider:   Christell Constant, MD     Other Instructions Dear Sierra Glover,   Thank you for coming in for your follow-up visit. We discussed your slightly elevated LDL cholesterol, and concerns about recent weight gain. . Your LDL cholesterol is a bit high, even though you're taking Atorvastatin. We discussed the possibility of increasing your Atorvastatin dose and making some lifestyle changes, such as diet and exercise, to help manage this.  YOUR PLAN:   -HYPERLIPIDEMIA: Hyperlipidemia means you have high levels of fats in your blood, which can increase your risk of heart disease. We will check your lipid panel in 3 months to see if lifestyle changes have helped. If your LDL cholesterol remains high, we may  consider increasing your Atorvastatin dose. - We discussed exercise done in one plane of motion in the setting of Long Covid-19 - We used the website [employer], a Mediterranean Diet, and your two meals a day to create a meal plan you would eat and that would be good for you.  INSTRUCTIONS:  Please continue with your current medications and try to make some lifestyle changes, such as healthier eating and regular exercise. We will check your lipid panel in 3 months to see if these changes have helped. If you experience any new symptoms or if your current symptoms worsen, please contact the office immediately.

## 2023-04-06 ENCOUNTER — Encounter: Payer: Self-pay | Admitting: Internal Medicine

## 2023-05-03 ENCOUNTER — Other Ambulatory Visit: Payer: Self-pay

## 2023-05-03 DIAGNOSIS — E785 Hyperlipidemia, unspecified: Secondary | ICD-10-CM

## 2023-05-03 DIAGNOSIS — I700 Atherosclerosis of aorta: Secondary | ICD-10-CM

## 2023-05-21 ENCOUNTER — Encounter: Payer: Self-pay | Admitting: Internal Medicine

## 2023-05-25 ENCOUNTER — Encounter: Payer: Self-pay | Admitting: Family Medicine

## 2023-05-25 ENCOUNTER — Encounter: Payer: Self-pay | Admitting: Internal Medicine

## 2023-05-25 ENCOUNTER — Telehealth: Payer: Medicare Other | Admitting: Family Medicine

## 2023-05-25 VITALS — BP 142/80 | HR 67 | Temp 97.7°F

## 2023-05-25 DIAGNOSIS — J069 Acute upper respiratory infection, unspecified: Secondary | ICD-10-CM | POA: Diagnosis not present

## 2023-05-25 MED ORDER — BENZONATATE 100 MG PO CAPS
100.0000 mg | ORAL_CAPSULE | Freq: Three times a day (TID) | ORAL | 0 refills | Status: AC | PRN
Start: 2023-05-25 — End: 2024-05-24

## 2023-05-25 MED ORDER — AZITHROMYCIN 250 MG PO TABS
ORAL_TABLET | ORAL | 0 refills | Status: AC
Start: 2023-05-25 — End: 2023-05-30

## 2023-05-25 NOTE — Progress Notes (Signed)
Virtual Visit via Video Note  Madelaine Bhat, CMA,acting as a scribe for Ellender Hose, NP.,have documented all relevant documentation on the behalf of Ellender Hose, NP,as directed by  Ellender Hose, NP while in the presence of Ellender Hose, NP.  I connected with Sierra Glover on 06/04/23 at 10:00 AM EST by a video enabled telemedicine application and verified that I am speaking with the correct person using two identifiers.  Patient Location: Home Provider Location: Home Office  I discussed the limitations, risks, security, and privacy concerns of performing an evaluation and management service by video and the availability of in person appointments. I also discussed with the patient that there may be a patient responsible charge related to this service. The patient expressed understanding and agreed to proceed.   Chief Complaint  Patient presents with   Nasal Congestion   Patient states symptoms started on Sunday 05/20/2023. She started with teary eyes,but symptoms now include a bad cough, stuffy nose, general malaise  especially at night.She denies chills, fever or body aches.Patient states she  has been using OTC Zyrtec, Hot beverages and Flonase but no relief. Patient states she has used her COVID 19 home kit and it came back negative.    ROS: Per HPI  Current Outpatient Medications:    atorvastatin (LIPITOR) 20 MG tablet, Take 1 tablet (20 mg total) by mouth daily., Disp: 90 tablet, Rfl: 3   B Complex Vitamins (VITAMIN B COMPLEX PO), Take by mouth daily at 6 (six) AM., Disp: , Rfl:    benzonatate (TESSALON PERLES) 100 MG capsule, Take 1 capsule (100 mg total) by mouth 3 (three) times daily as needed for cough., Disp: 30 capsule, Rfl: 0   ciclopirox (PENLAC) 8 % solution, Apply topically at bedtime. Apply over nail and surrounding skin. Apply daily over previous coat. After seven (7) days, may remove with alcohol and continue cycle., Disp: 6.6 mL, Rfl: 0   diazepam (VALIUM) 2 MG  tablet, One tab po 1hr prior to procedure, repeat if needed, Disp: 2 tablet, Rfl: 0   diltiazem (CARDIZEM LA) 240 MG 24 hr tablet, Take 1 tablet (240 mg total) by mouth daily., Disp: 90 tablet, Rfl: 3   fluticasone (FLONASE) 50 MCG/ACT nasal spray, PLACE 2 SPRAYS INTO BOTH NOSTRILS DAILY AS NEEDED FOR ALLERGIES OR RHINITIS., Disp: 48 mL, Rfl: 1   meclizine (ANTIVERT) 25 MG tablet, Take 1 tablet (25 mg total) by mouth 2 (two) times daily as needed for dizziness., Disp: 30 tablet, Rfl: 0   ondansetron (ZOFRAN ODT) 8 MG disintegrating tablet, Take 1 tablet (8 mg total) by mouth every 8 (eight) hours as needed for nausea or vomiting., Disp: 20 tablet, Rfl: 0   pantoprazole (PROTONIX) 40 MG tablet, Take 1 tablet (40 mg total) by mouth daily., Disp: 90 tablet, Rfl: 3   potassium chloride SA (KLOR-CON M20) 20 MEQ tablet, as needed (low potassium)., Disp: , Rfl:    valACYclovir (VALTREX) 500 MG tablet, take 1 tablet by oral route  every day (Patient taking differently: as needed (cold sores). take 1 tablet by oral route  every day), Disp: 90 tablet, Rfl: 0   valsartan-hydrochlorothiazide (DIOVAN-HCT) 160-12.5 MG tablet, Take 1 tablet by mouth daily., Disp: 90 tablet, Rfl: 2   Zinc Oxide-Vitamin C (ZINC PLUS VITAMIN C PO), Take by mouth as needed., Disp: , Rfl:    loratadine (CLARITIN) 10 MG tablet, Take 1 tablet (10 mg total) by mouth daily. (Patient taking differently: Take 10 mg by  mouth as needed for allergies.), Disp: 30 tablet, Rfl: 2  Observations/Objective: Today's Vitals   05/25/23 1023  BP: (!) 142/80  Pulse: 67  Temp: 97.7 F (36.5 C)  PainSc: 0-No pain   Physical Exam Vitals reviewed: This was a virtual visit.  Skin:    Comments: Denies fever  Neurological:     Mental Status: She is alert.     Assessment and Plan: URI with cough and congestion -     Azithromycin; Take 2 tablets (500 mg) on  Day 1,  followed by 1 tablet (250 mg) once daily on Days 2 through 5.  Dispense: 6 each;  Refill: 0 -     Benzonatate; Take 1 capsule (100 mg total) by mouth 3 (three) times daily as needed for cough.  Dispense: 30 capsule; Refill: 0    Follow Up Instructions: Return for keep same next.   I discussed the assessment and treatment plan with the patient. The patient was provided an opportunity to ask questions, and all were answered. The patient agreed with the plan and demonstrated an understanding of the instructions.   The patient was advised to call back or seek an in-person evaluation if the symptoms worsen or if the condition fails to improve as anticipated.  The above assessment and management plan was discussed with the patient. The patient verbalized understanding of and has agreed to the management plan.   I, Ellender Hose, NP, have reviewed all documentation for this visit. The documentation on 06/04/2023 for the exam, diagnosis, procedures, and orders are all accurate and complete.  Marland Kitchen

## 2023-07-02 ENCOUNTER — Other Ambulatory Visit: Payer: Medicare Other

## 2023-07-03 ENCOUNTER — Encounter: Payer: Self-pay | Admitting: Internal Medicine

## 2023-07-03 ENCOUNTER — Ambulatory Visit (INDEPENDENT_AMBULATORY_CARE_PROVIDER_SITE_OTHER): Payer: Medicare Other | Admitting: Internal Medicine

## 2023-07-03 VITALS — BP 130/82 | HR 74 | Temp 98.4°F | Ht 66.0 in | Wt 157.2 lb

## 2023-07-03 DIAGNOSIS — Z79899 Other long term (current) drug therapy: Secondary | ICD-10-CM

## 2023-07-03 DIAGNOSIS — M549 Dorsalgia, unspecified: Secondary | ICD-10-CM

## 2023-07-03 DIAGNOSIS — E7800 Pure hypercholesterolemia, unspecified: Secondary | ICD-10-CM

## 2023-07-03 DIAGNOSIS — I10 Essential (primary) hypertension: Secondary | ICD-10-CM | POA: Diagnosis not present

## 2023-07-03 MED ORDER — FLUTICASONE PROPIONATE 50 MCG/ACT NA SUSP: 2.0000 | Freq: Every day | NASAL | 1 refills | Status: DC | PRN

## 2023-07-03 NOTE — Progress Notes (Signed)
 I,Sierra Glover, CMA,acting as a neurosurgeon for Sierra LOISE Slocumb, MD.,have documented all relevant documentation on the behalf of Sierra LOISE Slocumb, MD,as directed by  Sierra LOISE Slocumb, MD while in the presence of Sierra LOISE Slocumb, MD.  Subjective:  Patient ID: Sierra Glover , female    DOB: 05-26-1956 , 68 y.o.   MRN: [id number]  Chief Complaint  Patient presents with   Hypertension   Hyperlipidemia    HPI  Patient presents today for a bp check. Patient reports compliance with her meds. She denies having headaches, shortness of breath and chest pain.   She complains of mid back pain. She reports this issue initially started a month ago. Denies LE weakness/paresthesias.  She has been evaluated by Ortho in the past, but not for this issue. She is not sure what may have triggered her sx.   Hypertension This is a chronic problem. The current episode started more than 1 year ago. The problem has been gradually improving since onset. The problem is controlled. Pertinent negatives include no blurred vision, chest pain or shortness of breath. Past treatments include diuretics. The current treatment provides moderate improvement.  Hyperlipidemia This is a chronic problem. The current episode started more than 1 year ago. Pertinent negatives include no chest pain, myalgias or shortness of breath. Current antihyperlipidemic treatment includes statins. The current treatment provides moderate improvement of lipids. Risk factors for coronary artery disease include dyslipidemia, hypertension, post-menopausal and a sedentary lifestyle.     Past Medical History:  Diagnosis Date   Anemia    before my hysterectomy   Dizziness    DVT (deep venous thrombosis) (HCC)    Many, many years ago.   HTN (hypertension)    Nausea    Palpitation    Serum calcium  elevated      Family History  Problem Relation Age of Onset   Alzheimer's disease Mother    Hypertension Mother    Hypertension Father    COPD  Father    Hypertension Other      Current Outpatient Medications:    atorvastatin  (LIPITOR) 20 MG tablet, Take 1 tablet (20 mg total) by mouth daily., Disp: 90 tablet, Rfl: 3   B Complex Vitamins (VITAMIN B COMPLEX PO), Take by mouth daily at 6 (six) AM., Disp: , Rfl:    benzonatate  (TESSALON  PERLES) 100 MG capsule, Take 1 capsule (100 mg total) by mouth 3 (three) times daily as needed for cough., Disp: 30 capsule, Rfl: 0   ciclopirox  (PENLAC ) 8 % solution, Apply topically at bedtime. Apply over nail and surrounding skin. Apply daily over previous coat. After seven (7) days, may remove with alcohol and continue cycle., Disp: 6.6 mL, Rfl: 0   diazepam  (VALIUM ) 2 MG tablet, One tab po 1hr prior to procedure, repeat if needed, Disp: 2 tablet, Rfl: 0   diltiazem  (CARDIZEM  LA) 240 MG 24 hr tablet, Take 1 tablet (240 mg total) by mouth daily., Disp: 90 tablet, Rfl: 3   meclizine  (ANTIVERT ) 25 MG tablet, Take 1 tablet (25 mg total) by mouth 2 (two) times daily as needed for dizziness., Disp: 30 tablet, Rfl: 0   ondansetron  (ZOFRAN  ODT) 8 MG disintegrating tablet, Take 1 tablet (8 mg total) by mouth every 8 (eight) hours as needed for nausea or vomiting., Disp: 20 tablet, Rfl: 0   pantoprazole  (PROTONIX ) 40 MG tablet, Take 1 tablet (40 mg total) by mouth daily., Disp: 90 tablet, Rfl: 3   potassium chloride  SA (KLOR-CON  M20) 20  MEQ tablet, as needed (low potassium)., Disp: , Rfl:    valACYclovir  (VALTREX ) 500 MG tablet, take 1 tablet by oral route  every day (Patient taking differently: as needed (cold sores). take 1 tablet by oral route  every day), Disp: 90 tablet, Rfl: 0   valsartan -hydrochlorothiazide  (DIOVAN -HCT) 160-12.5 MG tablet, Take 1 tablet by mouth daily., Disp: 90 tablet, Rfl: 2   Zinc Oxide-Vitamin C (ZINC PLUS VITAMIN C PO), Take by mouth as needed., Disp: , Rfl:    fluticasone  (FLONASE ) 50 MCG/ACT nasal spray, Place 2 sprays into both nostrils daily as needed for allergies or rhinitis.,  Disp: 48 mL, Rfl: 1   loratadine  (CLARITIN ) 10 MG tablet, Take 1 tablet (10 mg total) by mouth daily. (Patient taking differently: Take 10 mg by mouth as needed for allergies.), Disp: 30 tablet, Rfl: 2   No Known Allergies   Review of Systems  Eyes:  Negative for blurred vision.  Respiratory: Negative.  Negative for shortness of breath.   Cardiovascular: Negative.  Negative for chest pain.  Gastrointestinal: Negative.   Musculoskeletal:  Positive for back pain. Negative for myalgias.  Neurological: Negative.   Psychiatric/Behavioral: Negative.       Today's Vitals   07/03/23 1002  BP: 130/82  Pulse: 74  Temp: 98.4 F (36.9 C)  SpO2: 98%  Weight: 157 lb 3.2 oz (71.3 kg)  Height: 5' 6 (1.676 m)   Body mass index is 25.37 kg/m.  Wt Readings from Last 3 Encounters:  07/03/23 157 lb 3.2 oz (71.3 kg)  03/20/23 157 lb (71.2 kg)  02/26/23 156 lb (70.8 kg)     Objective:  Physical Exam Vitals and nursing note reviewed.  Constitutional:      Appearance: Normal appearance.  HENT:     Head: Normocephalic and atraumatic.  Eyes:     Extraocular Movements: Extraocular movements intact.  Cardiovascular:     Rate and Rhythm: Normal rate and regular rhythm.     Heart sounds: Normal heart sounds.  Pulmonary:     Effort: Pulmonary effort is normal.     Breath sounds: Normal breath sounds.  Musculoskeletal:     Cervical back: Normal range of motion.  Skin:    General: Skin is warm.  Neurological:     General: No focal deficit present.     Mental Status: She is alert.  Psychiatric:        Mood and Affect: Mood normal.        Behavior: Behavior normal.         Assessment And Plan:  Essential hypertension, benign Assessment & Plan: Chronic, fair control. Goal BP<120/80.  She will continue with diltiazem  240mg  daily and valsartan /hct 160/12.5mg  daily. Encouraged to follow low sodium diet. She is also encouraged to aim for at least 150 minutes of exercise per week.    Pure  hypercholesterolemia Assessment & Plan: Chronic, currently on atorvastatin  20mg  daily. She is encouraged to follow a heart healthy lifestyle. She will f/u in 6 months, also followed by Cardiology.   Orders: -     Lipid panel -     ALT -     Lipoprotein A (LPA)  Mid back pain Assessment & Plan: She is encouraged to perform stretching exercises. If sx persist, will consider imaging.    Drug therapy  Other orders -     Fluticasone  Propionate; Place 2 sprays into both nostrils daily as needed for allergies or rhinitis.  Dispense: 48 mL; Refill: 1  Return if symptoms worsen or fail to improve.  Patient was given opportunity to ask questions. Patient verbalized understanding of the plan and was able to repeat key elements of the plan. All questions were answered to their satisfaction.    I, Sierra LOISE Slocumb, MD, have reviewed all documentation for this visit. The documentation on 07/03/23 for the exam, diagnosis, procedures, and orders are all accurate and complete.   IF YOU HAVE BEEN REFERRED TO A SPECIALIST, IT MAY TAKE 1-2 WEEKS TO SCHEDULE/PROCESS THE REFERRAL. IF YOU HAVE NOT HEARD FROM US /SPECIALIST IN TWO WEEKS, PLEASE GIVE US  A CALL AT [phone].   THE PATIENT IS ENCOURAGED TO PRACTICE SOCIAL DISTANCING DUE TO THE COVID-19 PANDEMIC.

## 2023-07-03 NOTE — Patient Instructions (Signed)
 Hypertension, Adult Hypertension is another name for high blood pressure. High blood pressure forces your heart to work harder to pump blood. This can cause problems over time. There are two numbers in a blood pressure reading. There is a top number (systolic) over a bottom number (diastolic). It is best to have a blood pressure that is below 120/80. What are the causes? The cause of this condition is not known. Some other conditions can lead to high blood pressure. What increases the risk? Some lifestyle factors can make you more likely to develop high blood pressure: Smoking. Not getting enough exercise or physical activity. Being overweight. Having too much fat, sugar, calories, or salt (sodium) in your diet. Drinking too much alcohol. Other risk factors include: Having any of these conditions: Heart disease. Diabetes. High cholesterol. Kidney disease. Obstructive sleep apnea. Having a family history of high blood pressure and high cholesterol. Age. The risk increases with age. Stress. What are the signs or symptoms? High blood pressure may not cause symptoms. Very high blood pressure (hypertensive crisis) may cause: Headache. Fast or uneven heartbeats (palpitations). Shortness of breath. Nosebleed. Vomiting or feeling like you may vomit (nauseous). Changes in how you see. Very bad chest pain. Feeling dizzy. Seizures. How is this treated? This condition is treated by making healthy lifestyle changes, such as: Eating healthy foods. Exercising more. Drinking less alcohol. Your doctor may prescribe medicine if lifestyle changes do not help enough and if: Your top number is above 130. Your bottom number is above 80. Your personal target blood pressure may vary. Follow these instructions at home: Eating and drinking  If told, follow the DASH eating plan. To follow this plan: Fill one half of your plate at each meal with fruits and vegetables. Fill one fourth of your plate  at each meal with whole grains. Whole grains include whole-wheat pasta, brown rice, and whole-grain bread. Eat or drink low-fat dairy products, such as skim milk or low-fat yogurt. Fill one fourth of your plate at each meal with low-fat (lean) proteins. Low-fat proteins include fish, chicken without skin, eggs, beans, and tofu. Avoid fatty meat, cured and processed meat, or chicken with skin. Avoid pre-made or processed food. Limit the amount of salt in your diet to less than 1,500 mg each day. Do not drink alcohol if: Your doctor tells you not to drink. You are pregnant, may be pregnant, or are planning to become pregnant. If you drink alcohol: Limit how much you have to: 0-1 drink a day for women. 0-2 drinks a day for men. Know how much alcohol is in your drink. In the [place] one drink equals one 12 oz bottle of beer (355 mL), one 5 oz glass of wine (148 mL), or one 1 oz glass of hard liquor (44 mL). Lifestyle  Work with your doctor to stay at a healthy weight or to lose weight. Ask your doctor what the best weight is for you. Get at least 30 minutes of exercise that causes your heart to beat faster (aerobic exercise) most days of the week. This may include walking, swimming, or biking. Get at least 30 minutes of exercise that strengthens your muscles (resistance exercise) at least 3 days a week. This may include lifting weights or doing Pilates. Do not smoke or use any products that contain nicotine or tobacco. If you need help quitting, ask your doctor. Check your blood pressure at home as told by your doctor. Keep all follow-up visits. Medicines Take over-the-counter and prescription medicines  only as told by your doctor. Follow directions carefully. Do not skip doses of blood pressure medicine. The medicine does not work as well if you skip doses. Skipping doses also puts you at risk for problems. Ask your doctor about side effects or reactions to medicines that you should watch  for. Contact a doctor if: You think you are having a reaction to the medicine you are taking. You have headaches that keep coming back. You feel dizzy. You have swelling in your ankles. You have trouble with your vision. Get help right away if: You get a very bad headache. You start to feel mixed up (confused). You feel weak or numb. You feel faint. You have very bad pain in your: Chest. Belly (abdomen). You vomit more than once. You have trouble breathing. These symptoms may be an emergency. Get help right away. Call 911. Do not wait to see if the symptoms will go away. Do not drive yourself to the hospital. Summary Hypertension is another name for high blood pressure. High blood pressure forces your heart to work harder to pump blood. For most people, a normal blood pressure is less than 120/80. Making healthy choices can help lower blood pressure. If your blood pressure does not get lower with healthy choices, you may need to take medicine. This information is not intended to replace advice given to you by your health care provider. Make sure you discuss any questions you have with your health care provider. Document Revised: 03/24/2021 Document Reviewed: 03/24/2021 Elsevier Patient Education  2024 [employer].

## 2023-07-05 LAB — ALT: ALT: 16 [IU]/L (ref 0–32)

## 2023-07-05 LAB — LIPOPROTEIN A (LPA): Lipoprotein (a): 58.5 nmol/L (ref <75.0)

## 2023-07-05 LAB — LIPID PANEL
Chol/HDL Ratio: 2.8 {ratio} (ref 0.0–4.4)
Cholesterol, Total: 138 mg/dL (ref 100–199)
HDL: 49 mg/dL (ref >39)
LDL Chol Calc (NIH): 72 mg/dL (ref 0–99)
Triglycerides: 87 mg/dL (ref 0–149)
VLDL Cholesterol Cal: 17 mg/dL (ref 5–40)

## 2023-07-09 ENCOUNTER — Other Ambulatory Visit: Payer: Medicare Other

## 2023-07-14 DIAGNOSIS — M549 Dorsalgia, unspecified: Secondary | ICD-10-CM | POA: Insufficient documentation

## 2023-07-14 NOTE — Assessment & Plan Note (Signed)
Chronic, currently on atorvastatin 20mg  daily. She is encouraged to follow a heart healthy lifestyle. She will f/u in 6 months, also followed by Cardiology.

## 2023-07-14 NOTE — Assessment & Plan Note (Signed)
Chronic, fair control. Goal BP<120/80.  She will continue with diltiazem 240mg  daily and valsartan/hct 160/12.5mg  daily. Encouraged to follow low sodium diet. She is also encouraged to aim for at least 150 minutes of exercise per week.

## 2023-07-14 NOTE — Assessment & Plan Note (Signed)
She is encouraged to perform stretching exercises. If sx persist, will consider imaging.

## 2023-07-18 ENCOUNTER — Encounter: Payer: Self-pay | Admitting: Internal Medicine

## 2023-07-19 ENCOUNTER — Encounter: Payer: Self-pay | Admitting: Family Medicine

## 2023-07-19 ENCOUNTER — Ambulatory Visit: Payer: Medicare Other | Admitting: Family Medicine

## 2023-07-19 ENCOUNTER — Other Ambulatory Visit: Payer: Self-pay

## 2023-07-19 ENCOUNTER — Ambulatory Visit: Payer: Medicare Other | Attending: Family Medicine

## 2023-07-19 VITALS — BP 164/90 | HR 71 | Temp 98.7°F | Ht 66.0 in | Wt 157.0 lb

## 2023-07-19 DIAGNOSIS — I10 Essential (primary) hypertension: Secondary | ICD-10-CM

## 2023-07-19 DIAGNOSIS — R42 Dizziness and giddiness: Secondary | ICD-10-CM | POA: Diagnosis not present

## 2023-07-19 MED ORDER — VALSARTAN 160 MG PO TABS
160.0000 mg | ORAL_TABLET | Freq: Two times a day (BID) | ORAL | 1 refills | Status: DC
Start: 2023-07-19 — End: 2023-09-05

## 2023-07-19 NOTE — Patient Instructions (Signed)
Hypertension, Adult Hypertension is another name for high blood pressure. High blood pressure forces your heart to work harder to pump blood. This can cause problems over time. There are two numbers in a blood pressure reading. There is a top number (systolic) over a bottom number (diastolic). It is best to have a blood pressure that is below 120/80. What are the causes? The cause of this condition is not known. Some other conditions can lead to high blood pressure. What increases the risk? Some lifestyle factors can make you more likely to develop high blood pressure: Smoking. Not getting enough exercise or physical activity. Being overweight. Having too much fat, sugar, calories, or salt (sodium) in your diet. Drinking too much alcohol. Other risk factors include: Having any of these conditions: Heart disease. Diabetes. High cholesterol. Kidney disease. Obstructive sleep apnea. Having a family history of high blood pressure and high cholesterol. Age. The risk increases with age. Stress. What are the signs or symptoms? High blood pressure may not cause symptoms. Very high blood pressure (hypertensive crisis) may cause: Headache. Fast or uneven heartbeats (palpitations). Shortness of breath. Nosebleed. Vomiting or feeling like you may vomit (nauseous). Changes in how you see. Very bad chest pain. Feeling dizzy. Seizures. How is this treated? This condition is treated by making healthy lifestyle changes, such as: Eating healthy foods. Exercising more. Drinking less alcohol. Your doctor may prescribe medicine if lifestyle changes do not help enough and if: Your top number is above 130. Your bottom number is above 80. Your personal target blood pressure may vary. Follow these instructions at home: Eating and drinking  If told, follow the DASH eating plan. To follow this plan: Fill one half of your plate at each meal with fruits and vegetables. Fill one fourth of your plate  at each meal with whole grains. Whole grains include whole-wheat pasta, brown rice, and whole-grain bread. Eat or drink low-fat dairy products, such as skim milk or low-fat yogurt. Fill one fourth of your plate at each meal with low-fat (lean) proteins. Low-fat proteins include fish, chicken without skin, eggs, beans, and tofu. Avoid fatty meat, cured and processed meat, or chicken with skin. Avoid pre-made or processed food. Limit the amount of salt in your diet to less than 1,500 mg each day. Do not drink alcohol if: Your doctor tells you not to drink. You are pregnant, may be pregnant, or are planning to become pregnant. If you drink alcohol: Limit how much you have to: 0-1 drink a day for women. 0-2 drinks a day for men. Know how much alcohol is in your drink. In the [place] one drink equals one 12 oz bottle of beer (355 mL), one 5 oz glass of wine (148 mL), or one 1 oz glass of hard liquor (44 mL). Lifestyle  Work with your doctor to stay at a healthy weight or to lose weight. Ask your doctor what the best weight is for you. Get at least 30 minutes of exercise that causes your heart to beat faster (aerobic exercise) most days of the week. This may include walking, swimming, or biking. Get at least 30 minutes of exercise that strengthens your muscles (resistance exercise) at least 3 days a week. This may include lifting weights or doing Pilates. Do not smoke or use any products that contain nicotine or tobacco. If you need help quitting, ask your doctor. Check your blood pressure at home as told by your doctor. Keep all follow-up visits. Medicines Take over-the-counter and prescription medicines  only as told by your doctor. Follow directions carefully. Do not skip doses of blood pressure medicine. The medicine does not work as well if you skip doses. Skipping doses also puts you at risk for problems. Ask your doctor about side effects or reactions to medicines that you should watch  for. Contact a doctor if: You think you are having a reaction to the medicine you are taking. You have headaches that keep coming back. You feel dizzy. You have swelling in your ankles. You have trouble with your vision. Get help right away if: You get a very bad headache. You start to feel mixed up (confused). You feel weak or numb. You feel faint. You have very bad pain in your: Chest. Belly (abdomen). You vomit more than once. You have trouble breathing. These symptoms may be an emergency. Get help right away. Call 911. Do not wait to see if the symptoms will go away. Do not drive yourself to the hospital. Summary Hypertension is another name for high blood pressure. High blood pressure forces your heart to work harder to pump blood. For most people, a normal blood pressure is less than 120/80. Making healthy choices can help lower blood pressure. If your blood pressure does not get lower with healthy choices, you may need to take medicine. This information is not intended to replace advice given to you by your health care provider. Make sure you discuss any questions you have with your health care provider. Document Revised: 03/24/2021 Document Reviewed: 03/24/2021 Elsevier Patient Education  2024 [employer].

## 2023-07-19 NOTE — Progress Notes (Signed)
I,Jameka J Llittleton, CMA,acting as a Neurosurgeon for [employer], NP.,have documented all relevant documentation on the behalf of Ellender Hose, NP,as directed by  Ellender Hose, NP while in the presence of Ellender Hose, NP.  Subjective:  Patient ID: Sierra Glover , female    DOB: 02/08/1956 , 68 y.o.   MRN: [id number]  Chief Complaint  Patient presents with   Hypertension    HPI  Patient is a 68 year old female who presents today for evaluation of her blood pressure. Patient reports that 2 nights ago , she had a dizzy spell with lightheadedness and so she decided to relax and checked her BP, then she realized her BP was 186/95 and since then it has been elevated with Systolic BP in the 170s,160s. She states that  her normal range is usually in the 120s so she is concerned. She reports that she has been compliant in taking her  medications.  She states that yesterday she took a tablet of hydrochlorothiazide 12.5 mg that she had at home. She denies having headaches, shortness of breath and chest pain.    Hypertension This is a chronic problem. The current episode started more than 1 year ago. The problem has been gradually improving since onset. The problem is controlled. Pertinent negatives include no blurred vision, chest pain, headaches or shortness of breath. Past treatments include diuretics. The current treatment provides moderate improvement.  Hyperlipidemia This is a chronic problem. The current episode started more than 1 year ago. Pertinent negatives include no chest pain, myalgias or shortness of breath. Current antihyperlipidemic treatment includes statins. The current treatment provides moderate improvement of lipids. Risk factors for coronary artery disease include dyslipidemia, hypertension, post-menopausal and a sedentary lifestyle.     Past Medical History:  Diagnosis Date   Anemia    before my hysterectomy   Dizziness    DVT (deep venous thrombosis) (HCC)    "Many, many years  ago."   HTN (hypertension)    Nausea    Palpitation    Serum calcium elevated      Family History  Problem Relation Age of Onset   Alzheimer's disease Mother    Hypertension Mother    Hypertension Father    COPD Father    Hypertension Other      Current Outpatient Medications:    atorvastatin (LIPITOR) 20 MG tablet, Take 1 tablet (20 mg total) by mouth daily., Disp: 90 tablet, Rfl: 3   B Complex Vitamins (VITAMIN B COMPLEX PO), Take by mouth daily at 6 (six) AM., Disp: , Rfl:    benzonatate (TESSALON PERLES) 100 MG capsule, Take 1 capsule (100 mg total) by mouth 3 (three) times daily as needed for cough., Disp: 30 capsule, Rfl: 0   ciclopirox (PENLAC) 8 % solution, Apply topically at bedtime. Apply over nail and surrounding skin. Apply daily over previous coat. After seven (7) days, may remove with alcohol and continue cycle., Disp: 6.6 mL, Rfl: 0   diazepam (VALIUM) 2 MG tablet, One tab po 1hr prior to procedure, repeat if needed, Disp: 2 tablet, Rfl: 0   diltiazem (CARDIZEM LA) 240 MG 24 hr tablet, Take 1 tablet (240 mg total) by mouth daily., Disp: 90 tablet, Rfl: 3   fluticasone (FLONASE) 50 MCG/ACT nasal spray, Place 2 sprays into both nostrils daily as needed for allergies or rhinitis., Disp: 48 mL, Rfl: 1   meclizine (ANTIVERT) 25 MG tablet, Take 1 tablet (25 mg total) by mouth 2 (two) times daily as  needed for dizziness., Disp: 30 tablet, Rfl: 0   ondansetron (ZOFRAN ODT) 8 MG disintegrating tablet, Take 1 tablet (8 mg total) by mouth every 8 (eight) hours as needed for nausea or vomiting., Disp: 20 tablet, Rfl: 0   pantoprazole (PROTONIX) 40 MG tablet, Take 1 tablet (40 mg total) by mouth daily., Disp: 90 tablet, Rfl: 3   potassium chloride SA (KLOR-CON M20) 20 MEQ tablet, as needed (low potassium)., Disp: , Rfl:    valACYclovir (VALTREX) 500 MG tablet, take 1 tablet by oral route  every day (Patient taking differently: as needed (cold sores). take 1 tablet by oral route  every  day), Disp: 90 tablet, Rfl: 0   valsartan (DIOVAN) 160 MG tablet, Take 1 tablet (160 mg total) by mouth 2 (two) times daily., Disp: 60 tablet, Rfl: 1   Zinc Oxide-Vitamin C (ZINC PLUS VITAMIN C PO), Take by mouth as needed., Disp: , Rfl:    loratadine (CLARITIN) 10 MG tablet, Take 1 tablet (10 mg total) by mouth daily. (Patient taking differently: Take 10 mg by mouth as needed for allergies.), Disp: 30 tablet, Rfl: 2   No Known Allergies   Review of Systems  Constitutional: Negative.   HENT: Negative.    Eyes: Negative.  Negative for blurred vision.  Respiratory:  Negative for shortness of breath.   Cardiovascular:  Negative for chest pain.  Gastrointestinal: Negative.   Genitourinary: Negative.   Musculoskeletal:  Negative for myalgias.  Skin: Negative.   Neurological:  Positive for dizziness. Negative for light-headedness and headaches.  Psychiatric/Behavioral: Negative.       Today's Vitals   07/19/23 1000  BP: (!) 164/90  Pulse: 71  Temp: 98.7 F (37.1 C)  TempSrc: Oral  Weight: 157 lb (71.2 kg)  Height: 5\' 6"  (1.676 m)  PainSc: 0-No pain   Body mass index is 25.34 kg/m.  Wt Readings from Last 3 Encounters:  07/19/23 157 lb (71.2 kg)  07/03/23 157 lb 3.2 oz (71.3 kg)  03/20/23 157 lb (71.2 kg)    The 10-year ASCVD risk score (Arnett DK, et al., 2019) is: 12.6%   Values used to calculate the score:     Age: 60 years     Sex: Female     Is Non-Hispanic African American: Yes     Diabetic: No     Tobacco smoker: No     Systolic Blood Pressure: 164 mmHg     Is BP treated: Yes     HDL Cholesterol: 49 mg/dL     Total Cholesterol: 138 mg/dL  Objective:  Physical Exam HENT:     Head: Normocephalic.  Cardiovascular:     Rate and Rhythm: Normal rate and regular rhythm.  Pulmonary:     Effort: Pulmonary effort is normal.     Breath sounds: Normal breath sounds.  Abdominal:     General: Bowel sounds are normal.  Neurological:     General: No focal deficit  present.     Mental Status: She is alert and oriented to person, place, and time.  Psychiatric:        Mood and Affect: Mood normal.        Behavior: Behavior normal.         Assessment And Plan:  Essential hypertension, benign -     Valsartan; Take 1 tablet (160 mg total) by mouth 2 (two) times daily.  Dispense: 60 tablet; Refill: 1  Dizziness -     LONG TERM MONITOR (3-14 DAYS); Future -  BMP8+eGFR -     CBC    Return if symptoms worsen or fail to improve, for Keep scheduled appt.  Patient was given opportunity to ask questions. Patient verbalized understanding of the plan and was able to repeat key elements of the plan. All questions were answered to their satisfaction.    I, Ellender Hose, NP, have reviewed all documentation for this visit. The documentation on 07/24/2023 for the exam, diagnosis, procedures, and orders are all accurate and complete.    IF YOU HAVE BEEN REFERRED TO A SPECIALIST, IT MAY TAKE 1-2 WEEKS TO SCHEDULE/PROCESS THE REFERRAL. IF YOU HAVE NOT HEARD FROM US/SPECIALIST IN TWO WEEKS, PLEASE GIVE [place] A CALL AT [phone].

## 2023-07-19 NOTE — Progress Notes (Unsigned)
EP to read.

## 2023-07-20 LAB — CBC
Hematocrit: 42.6 % (ref 34.0–46.6)
Hemoglobin: 13.6 g/dL (ref 11.1–15.9)
MCH: 28.3 pg (ref 26.6–33.0)
MCHC: 31.9 g/dL (ref 31.5–35.7)
MCV: 89 fL (ref 79–97)
Platelets: 334 10*3/uL (ref 150–450)
RBC: 4.81 x10E6/uL (ref 3.77–5.28)
RDW: 12.4 % (ref 11.7–15.4)
WBC: 7.9 10*3/uL (ref 3.4–10.8)

## 2023-07-20 LAB — BMP8+EGFR
BUN/Creatinine Ratio: 11 — ABNORMAL LOW (ref 12–28)
BUN: 11 mg/dL (ref 8–27)
CO2: 25 mmol/L (ref 20–29)
Calcium: 10.5 mg/dL — ABNORMAL HIGH (ref 8.7–10.3)
Chloride: 99 mmol/L (ref 96–106)
Creatinine, Ser: 1.03 mg/dL — ABNORMAL HIGH (ref 0.57–1.00)
Glucose: 97 mg/dL (ref 70–99)
Potassium: 3.8 mmol/L (ref 3.5–5.2)
Sodium: 141 mmol/L (ref 134–144)
eGFR: 60 mL/min/{1.73_m2} (ref >59)

## 2023-07-23 ENCOUNTER — Encounter: Payer: Self-pay | Admitting: Internal Medicine

## 2023-07-24 ENCOUNTER — Encounter: Payer: Self-pay | Admitting: Family Medicine

## 2023-07-24 NOTE — Assessment & Plan Note (Signed)
BP not within control today, increase Diovan to 320 mg daily.

## 2023-08-03 ENCOUNTER — Other Ambulatory Visit: Payer: Medicare Other

## 2023-08-03 DIAGNOSIS — I10 Essential (primary) hypertension: Secondary | ICD-10-CM

## 2023-08-03 LAB — BASIC METABOLIC PANEL
BUN/Creatinine Ratio: 14 (ref 12–28)
BUN: 13 mg/dL (ref 8–27)
CO2: 27 mmol/L (ref 20–29)
Calcium: 10.3 mg/dL (ref 8.7–10.3)
Chloride: 98 mmol/L (ref 96–106)
Creatinine, Ser: 0.95 mg/dL (ref 0.57–1.00)
Glucose: 86 mg/dL (ref 70–99)
Potassium: 4.3 mmol/L (ref 3.5–5.2)
Sodium: 139 mmol/L (ref 134–144)
eGFR: 66 mL/min/{1.73_m2} (ref >59)

## 2023-08-08 ENCOUNTER — Ambulatory Visit: Payer: Self-pay | Admitting: Internal Medicine

## 2023-08-09 ENCOUNTER — Ambulatory Visit: Payer: Medicare Other | Admitting: Internal Medicine

## 2023-08-09 ENCOUNTER — Telehealth: Payer: Self-pay | Admitting: Internal Medicine

## 2023-08-09 ENCOUNTER — Encounter: Payer: Self-pay | Admitting: Internal Medicine

## 2023-08-09 VITALS — BP 118/75 | Ht 66.0 in

## 2023-08-09 DIAGNOSIS — L989 Disorder of the skin and subcutaneous tissue, unspecified: Secondary | ICD-10-CM | POA: Diagnosis not present

## 2023-08-09 DIAGNOSIS — R42 Dizziness and giddiness: Secondary | ICD-10-CM | POA: Diagnosis not present

## 2023-08-09 DIAGNOSIS — I10 Essential (primary) hypertension: Secondary | ICD-10-CM

## 2023-08-09 MED ORDER — MECLIZINE HCL 25 MG PO TABS
25.0000 mg | ORAL_TABLET | Freq: Two times a day (BID) | ORAL | 0 refills | Status: DC | PRN
Start: 2023-08-09 — End: 2023-08-13

## 2023-08-09 NOTE — Assessment & Plan Note (Signed)
 UC notes reviewed in Care Everywhere. I agree with Derm evaluation. She is encouraged to have dermatologist send a copy of her notes to the office.  This is possibly a lipoma, benign lesion as per UC or a cystic structure.

## 2023-08-09 NOTE — Assessment & Plan Note (Signed)
 Chronic, well controlled.  She is encouraged to send home BP readings in Mychart.  She is reminded to follow low sodium diet. She is encouraged to decrease intake of processed meats including bacon, sausages and deli meats, breads, cheeses, and pizza.  She will continue with valsartan 160 2 tabs daily and diltiazem nightly.

## 2023-08-09 NOTE — Progress Notes (Signed)
 Virtual Visit via Video Note  I,Sierra Glover, CMA,acting as a Neurosurgeon for Sierra Aliment, MD.,have documented all relevant documentation on the behalf of Sierra Aliment, MD,as directed by  Sierra Aliment, MD while in the presence of Sierra Aliment, MD.  I connected with Sierra Glover on 08/09/23 at 11:00 AM EST by a video enabled telemedicine application and verified that I am speaking with the correct person using two identifiers.  Patient Location: Home Provider Location: Office/Clinic  I discussed the limitations, risks, security, and privacy concerns of performing an evaluation and management service by video and the availability of in person appointments. I also discussed with the patient that there may be a patient responsible charge related to this service. The patient expressed understanding and agreed to proceed.  Subjective: PCP: Sierra Peng, MD  No chief complaint on file.  Patient presents today for UC f/u.  She preferred virtual visit due to weather.  She was seen by [facility] in [place], [place] on Tuesday for further evaluation of a lump in her scalp. She states she felt it when running her hands through her hair. She was concerned because it is tender to touch. She denies known trauma to this area.  No associated visual symptoms.  UC provider suggested Dermatology evaluation. She is scheduled to see [facility] next week.   She states still experiencing tenderness in the back of her head. Denies headaches and visual disturbances.     ROS: Per HPI  Current Outpatient Medications:    atorvastatin (LIPITOR) 20 MG tablet, Take 1 tablet (20 mg total) by mouth daily., Disp: 90 tablet, Rfl: 3   B Complex Vitamins (VITAMIN B COMPLEX PO), Take by mouth daily at 6 (six) AM., Disp: , Rfl:    benzonatate (TESSALON PERLES) 100 MG capsule, Take 1 capsule (100 mg total) by mouth 3 (three) times daily as needed for cough., Disp: 30 capsule, Rfl: 0   ciclopirox  (PENLAC) 8 % solution, Apply topically at bedtime. Apply over nail and surrounding skin. Apply daily over previous coat. After seven (7) days, may remove with alcohol and continue cycle., Disp: 6.6 mL, Rfl: 0   diazepam (VALIUM) 2 MG tablet, One tab po 1hr prior to procedure, repeat if needed, Disp: 2 tablet, Rfl: 0   diltiazem (CARDIZEM LA) 240 MG 24 hr tablet, Take 1 tablet (240 mg total) by mouth daily., Disp: 90 tablet, Rfl: 3   fluticasone (FLONASE) 50 MCG/ACT nasal spray, Place 2 sprays into both nostrils daily as needed for allergies or rhinitis., Disp: 48 mL, Rfl: 1   ondansetron (ZOFRAN ODT) 8 MG disintegrating tablet, Take 1 tablet (8 mg total) by mouth every 8 (eight) hours as needed for nausea or vomiting., Disp: 20 tablet, Rfl: 0   pantoprazole (PROTONIX) 40 MG tablet, Take 1 tablet (40 mg total) by mouth daily., Disp: 90 tablet, Rfl: 3   potassium chloride SA (KLOR-CON M20) 20 MEQ tablet, as needed (low potassium)., Disp: , Rfl:    valACYclovir (VALTREX) 500 MG tablet, take 1 tablet by oral route  every day (Patient taking differently: as needed (cold sores). take 1 tablet by oral route  every day), Disp: 90 tablet, Rfl: 0   valsartan (DIOVAN) 160 MG tablet, Take 1 tablet (160 mg total) by mouth 2 (two) times daily., Disp: 60 tablet, Rfl: 1   Zinc Oxide-Vitamin C (ZINC PLUS VITAMIN C PO), Take by mouth as needed., Disp: , Rfl:    loratadine (CLARITIN)  10 MG tablet, Take 1 tablet (10 mg total) by mouth daily. (Patient taking differently: Take 10 mg by mouth as needed for allergies.), Disp: 30 tablet, Rfl: 2   meclizine (ANTIVERT) 25 MG tablet, Take 1 tablet (25 mg total) by mouth 2 (two) times daily as needed for dizziness., Disp: 30 tablet, Rfl: 0  Observations/Objective: Today's Vitals   08/09/23 0932  BP: 118/75  SpO2: 98%  Height: 5\' 6"  (1.676 m)   BP Readings from Last 3 Encounters:  08/09/23 118/75  07/19/23 (!) 164/90  07/03/23 130/82     Physical Exam Vitals and  nursing note reviewed.  Constitutional:      Appearance: Normal appearance.  HENT:     Head: Normocephalic and atraumatic.  Eyes:     Extraocular Movements: Extraocular movements intact.  Pulmonary:     Effort: Pulmonary effort is normal.  Musculoskeletal:     Cervical back: Normal range of motion.  Skin:    General: Skin is warm.     Comments: Scalp swelling, posterior head  Neurological:     General: No focal deficit present.     Mental Status: She is alert.  Psychiatric:        Mood and Affect: Mood normal.        Behavior: Behavior normal.     Assessment and Plan: Scalp lesion Assessment & Plan: UC notes reviewed in Care Everywhere. I agree with Derm evaluation. She is encouraged to have dermatologist send a copy of her notes to the office.  This is possibly a lipoma, benign lesion as per UC or a cystic structure.    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 She denies having recent COVID infection. However, after further questioning, she states her husband has noticed that she experiences her sx typically after a workout with her trainer.  It is possible her sx are due to electrolyte imbalance.  Advised to start Nuum tablets or another electrolyte powder.  She should use this after workouts. Also advised to check her blood pressure when she is having symptoms.  OF note, previous MRI results reviewed in detail.  May need to consider Neuro evaluation if her sx persist.    Orders: -     Meclizine HCl; Take 1 tablet (25 mg total) by mouth 2 (two) times daily as needed for dizziness.  Dispense: 30 tablet; Refill: 0  Essential hypertension, benign Assessment & Plan: Chronic, well controlled.  She is encouraged to send home BP readings in Mychart.  She is reminded to follow low sodium diet. She is encouraged to decrease intake of processed meats including bacon, sausages and deli meats, breads, cheeses, and  pizza.  She will continue with valsartan 160 2 tabs daily and diltiazem nightly.     TIME IN VISIT: 19 minutes  Follow Up Instructions: Return if symptoms worsen or fail to improve.   I discussed the assessment and treatment plan with the patient. The patient was provided an opportunity to ask questions, and all were answered. The patient agreed with the plan and demonstrated an understanding of the instructions.   The patient was advised to call back or seek an in-person evaluation if the symptoms worsen or if the condition fails to improve as anticipated.  The above assessment and management plan was discussed with the patient. The patient verbalized understanding of and has agreed to the management plan.   I, Sierra Aliment, MD, have reviewed all documentation  for this visit. The documentation on 08/09/23 for the exam, diagnosis, procedures, and orders are all accurate and complete.

## 2023-08-09 NOTE — Assessment & Plan Note (Signed)
 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 She denies having recent COVID infection. However, after further questioning, she states her husband has noticed that she experiences her sx typically after a workout with her trainer.  It is possible her sx are due to electrolyte imbalance.  Advised to start Nuum tablets or another electrolyte powder.  She should use this after workouts. Also advised to check her blood pressure when she is having symptoms.  OF note, previous MRI results reviewed in detail.  May need to consider Neuro evaluation if her sx persist.

## 2023-08-09 NOTE — Patient Instructions (Signed)
 Dizziness Dizziness is a common problem. It makes you feel unsteady or light-headed. You may feel like you're about to faint. Dizziness can lead to getting hurt if you stumble or fall. It's more common to feel dizzy if you're an older adult. Many things can cause you to feel dizzy. These include: Medicines. Dehydration. This is when there's not enough water in your body. Illness. Follow these instructions at home: Eating and drinking  Drink enough fluid to keep your pee (urine) pale yellow. This helps keep you from getting dehydrated. Try to drink more clear fluids, such as water. Do not drink alcohol. Try to limit how much caffeine you take in. Try to limit how much salt, also called sodium, you take in. Activity Try not to make quick movements. Stand up slowly from sitting in a chair. Steady yourself until you feel okay. In the morning, first sit up on the side of the bed. When you feel okay, hold onto something and slowly stand up. Do this until you know that your balance is okay. If you need to stand in one place for a long time, move your legs often. Tighten and relax the muscles in your legs while you're standing. Do not drive or use machines if you feel dizzy. Avoid bending down if you feel dizzy. Place items in your home so you can reach them without leaning over. Lifestyle Do not smoke, vape, or use products with nicotine or tobacco in them. If you need help quitting, talk with your health care provider. Try to lower your stress level. You can do this by using methods like yoga or meditation. Talk with your provider if you need help. General instructions Watch your dizziness for any changes. Take your medicines only as told by your provider. Talk with your provider if you think you're dizzy because of a medicine you're taking. Tell a friend or a family member that you're feeling dizzy. If they spot any changes in your behavior, have them call your provider. Contact a health care  provider if: Your dizziness doesn't go away, or you have new symptoms. Your dizziness gets worse. You feel like you may vomit. You have trouble hearing. You have a fever. You have neck pain or a stiff neck. You fall or get hurt. Get help right away if: You vomit each time you eat or drink. You have watery poop and can't eat or drink. You have trouble talking, walking, swallowing, or using your arms, hands, or legs. You feel very weak. You're bleeding. You're not thinking clearly, or you have trouble forming sentences. A friend or family member may spot this. Your vision changes, or you get a very bad headache. These symptoms may be an emergency. Call 911 right away. Do not wait to see if the symptoms will go away. Do not drive yourself to the hospital. This information is not intended to replace advice given to you by your health care provider. Make sure you discuss any questions you have with your health care provider. Document Revised: 03/08/2023 Document Reviewed: 07/20/2022 Elsevier Patient Education  2024 [employer].

## 2023-08-10 ENCOUNTER — Encounter: Payer: Self-pay | Admitting: Internal Medicine

## 2023-08-11 ENCOUNTER — Encounter: Payer: Self-pay | Admitting: Internal Medicine

## 2023-08-13 ENCOUNTER — Other Ambulatory Visit: Payer: Self-pay

## 2023-08-13 DIAGNOSIS — R42 Dizziness and giddiness: Secondary | ICD-10-CM

## 2023-08-13 MED ORDER — MECLIZINE HCL 25 MG PO TABS
25.0000 mg | ORAL_TABLET | Freq: Two times a day (BID) | ORAL | 0 refills | Status: AC | PRN
Start: 2023-08-13 — End: ?

## 2023-08-14 ENCOUNTER — Other Ambulatory Visit: Payer: Self-pay

## 2023-08-14 DIAGNOSIS — I10 Essential (primary) hypertension: Secondary | ICD-10-CM

## 2023-08-15 ENCOUNTER — Other Ambulatory Visit: Payer: Self-pay

## 2023-08-15 ENCOUNTER — Encounter: Payer: Self-pay | Admitting: Internal Medicine

## 2023-08-15 MED ORDER — HYDROCHLOROTHIAZIDE 12.5 MG PO CAPS: 12.5000 mg | ORAL_CAPSULE | Freq: Every day | ORAL | 1 refills | Status: DC

## 2023-08-18 DIAGNOSIS — R42 Dizziness and giddiness: Secondary | ICD-10-CM

## 2023-08-22 ENCOUNTER — Ambulatory Visit: Payer: Medicare Other

## 2023-08-22 DIAGNOSIS — Z0000 Encounter for general adult medical examination without abnormal findings: Secondary | ICD-10-CM | POA: Diagnosis not present

## 2023-08-22 NOTE — Patient Instructions (Signed)
 Ms. Bergman , Thank you for taking time to come for your Medicare Wellness Visit. I appreciate your ongoing commitment to your health goals. Please review the following plan we discussed and let me know if I can assist you in the future.   Referrals/Orders/Follow-Ups/Clinician Recommendations: mammogram requested  This is a list of the screening recommended for you and due dates:  Health Maintenance  Topic Date Due   COVID-19 Vaccine (3 - Moderna risk series) 10/22/2019   Flu Shot  09/17/2023*   Zoster (Shingles) Vaccine (1 of 2) 10/01/2023*   Pneumonia Vaccine (1 of 2 - PCV) 02/26/2024*   Mammogram  12/09/2023   Medicare Annual Wellness Visit  08/21/2024   Colon Cancer Screening  05/26/2026   DTaP/Tdap/Td vaccine (3 - Td or Tdap) 05/08/2028   DEXA scan (bone density measurement)  Completed   Hepatitis C Screening  Completed   HPV Vaccine  Aged Out  *Topic was postponed. The date shown is not the original due date.    Advanced directives: (ACP Link)Information on Advanced Care Planning can be found at [facility] Directives Advance Health Care Directives ([email or web address]   Next Medicare Annual Wellness Visit scheduled for next year: No, office will schedule  insert Preventive Care attachment Insert FALL PREVENTION attachment if needed

## 2023-08-22 NOTE — Progress Notes (Signed)
 Subjective:   Sierra Glover is a 68 y.o. who presents for a Medicare Wellness preventive visit.  Visit Complete: Virtual I connected with  [name] on 08/22/23 by a video and audio enabled telemedicine application and verified that I am speaking with the correct person using two identifiers.  Patient Location: Home  Provider Location: Office/Clinic  I discussed the limitations of evaluation and management by telemedicine. The patient expressed understanding and agreed to proceed.  Vital Signs: Because this visit was a virtual/telehealth visit, some criteria may be missing or patient reported. Any vitals not documented were not able to be obtained and vitals that have been documented are patient reported.    AWV Questionnaire: Yes: Patient Medicare AWV questionnaire was completed by the patient on 08/21/2023; I have confirmed that all information answered by patient is correct and no changes since this date.  Cardiac Risk Factors include: hypertension     Objective:    Today's Vitals   There is no height or weight on file to calculate BMI.     08/22/2023    8:22 AM 07/12/2022    8:22 AM 11/23/2021    2:54 PM 06/07/2021   12:03 PM 05/22/2021   12:50 PM 01/07/2021    6:35 AM 01/01/2021   10:33 AM  Advanced Directives  Does Patient Have a Medical Advance Directive? No No No No No No No  Would patient like information on creating a medical advance directive?   No - Patient declined Yes (MAU/Ambulatory/Procedural Areas - Information given)  No - Patient declined No - Patient declined    Current Medications (verified) Outpatient Encounter Medications as of 08/22/2023  Medication Sig   atorvastatin (LIPITOR) 20 MG tablet Take 1 tablet (20 mg total) by mouth daily.   B Complex Vitamins (VITAMIN B COMPLEX PO) Take by mouth daily at 6 (six) AM.   benzonatate (TESSALON PERLES) 100 MG capsule Take 1 capsule (100 mg total) by mouth 3 (three) times daily as needed for cough.    ciclopirox (PENLAC) 8 % solution Apply topically at bedtime. Apply over nail and surrounding skin. Apply daily over previous coat. After seven (7) days, may remove with alcohol and continue cycle.   diazepam (VALIUM) 2 MG tablet One tab po 1hr prior to procedure, repeat if needed   diltiazem (CARDIZEM LA) 240 MG 24 hr tablet Take 1 tablet (240 mg total) by mouth daily.   fluticasone (FLONASE) 50 MCG/ACT nasal spray Place 2 sprays into both nostrils daily as needed for allergies or rhinitis.   hydrochlorothiazide (MICROZIDE) 12.5 MG capsule Take 1 capsule (12.5 mg total) by mouth daily.   meclizine (ANTIVERT) 25 MG tablet Take 1 tablet (25 mg total) by mouth 2 (two) times daily as needed for dizziness.   ondansetron (ZOFRAN ODT) 8 MG disintegrating tablet Take 1 tablet (8 mg total) by mouth every 8 (eight) hours as needed for nausea or vomiting.   pantoprazole (PROTONIX) 40 MG tablet Take 1 tablet (40 mg total) by mouth daily.   valACYclovir (VALTREX) 500 MG tablet take 1 tablet by oral route  every day (Patient taking differently: as needed (cold sores). take 1 tablet by oral route  every day)   valsartan (DIOVAN) 160 MG tablet Take 1 tablet (160 mg total) by mouth 2 (two) times daily.   Zinc Oxide-Vitamin C (ZINC PLUS VITAMIN C PO) Take by mouth as needed.   loratadine (CLARITIN) 10 MG tablet Take 1 tablet (10 mg total) by mouth daily. (  Patient taking differently: Take 10 mg by mouth as needed for allergies.)   potassium chloride SA (KLOR-CON M20) 20 MEQ tablet as needed (low potassium). (Patient not taking: Reported on 08/22/2023)   No facility-administered encounter medications on file as of 08/22/2023.    Allergies (verified) Patient has no known allergies.   History: Past Medical History:  Diagnosis Date   Anemia    before my hysterectomy   Arthritis 2021   Dizziness    DVT (deep venous thrombosis) (HCC)    "Many, many years ago."   HTN (hypertension)    Nausea    Palpitation     Serum calcium elevated    Past Surgical History:  Procedure Laterality Date   ABDOMINAL HYSTERECTOMY  10 years   COLONOSCOPY     FRACTURE SURGERY  12/2020   partal hysterectomy  2009   TIBIA IM NAIL INSERTION Right 01/07/2021   Procedure: INTRAMEDULLARY (IM) NAIL TIBIAL;  Surgeon: Roby Lofts, MD;  Location: [facility] OR;  Service: Orthopedics;  Laterality: Right;   TUBAL LIGATION     Family History  Problem Relation Age of Onset   Alzheimer's disease Mother    Hypertension Mother    Hypertension Father    COPD Father    Hypertension Other    Social History   Socioeconomic History   Marital status: Married    Spouse name: Not on file   Number of children: Not on file   Years of education: Not on file   Highest education level: Not on file  Occupational History   Not on file  Tobacco Use   Smoking status: Never   Smokeless tobacco: Never   Tobacco comments:    N/a  Vaping Use   Vaping status: Never Used  Substance and Sexual Activity   Alcohol use: Not Currently    Comment: Occasionally,  not weekly   Drug use: Never   Sexual activity: Yes    Birth control/protection: Surgical, Other-see comments, None  Other Topics Concern   Not on file  Social History Narrative   Not on file   Social Drivers of Health   Financial Resource Strain: Low Risk  (08/22/2023)   Overall Financial Resource Strain (CARDIA)    Difficulty of Paying Living Expenses: Not hard at all  Food Insecurity: No Food Insecurity (08/22/2023)   Hunger Vital Sign    Worried About Running Out of Food in the Last Year: Never true    Ran Out of Food in the Last Year: Never true  Transportation Needs: No Transportation Needs (08/22/2023)   PRAPARE - Administrator, Civil Service (Medical): No    Lack of Transportation (Non-Medical): No  Physical Activity: Insufficiently Active (08/22/2023)   Exercise Vital Sign    Days of Exercise per Week: 2 days    Minutes of Exercise per Session: 50 min   Stress: No Stress Concern Present (08/22/2023)   [employer] of Occupational Health - Occupational Stress Questionnaire    Feeling of Stress : Only a little  Social Connections: Moderately Integrated (08/22/2023)   Social Connection and Isolation Panel [NHANES]    Frequency of Communication with Friends and Family: More than three times a week    Frequency of Social Gatherings with Friends and Family: Once a week    Attends Religious Services: More than 4 times per year    Active Member of [employer] or Organizations: No    Attends Banker Meetings: Never    Marital Status:  Married    Tobacco Counseling Counseling given: Not Answered Tobacco comments: N/a    Clinical Intake:  Pre-visit preparation completed: Yes  Pain : No/denies pain     Nutritional Risks: None Diabetes: No  How often do you need to have someone help you when you read instructions, pamphlets, or other written materials from your doctor or pharmacy?: 1 - Never  Interpreter Needed?: No  Information entered by :: NAllen LPN   Activities of Daily Living     08/21/2023    8:25 PM  In your present state of health, do you have any difficulty performing the following activities:  Hearing? 0  Vision? 0  Difficulty concentrating or making decisions? 0  Walking or climbing stairs? 0  Dressing or bathing? 0  Doing errands, shopping? 0  Preparing Food and eating ? N  Using the Toilet? N  In the past six months, have you accidently leaked urine? N  Do you have problems with loss of bowel control? N  Managing your Medications? N  Managing your Finances? N  Housekeeping or managing your Housekeeping? N    Patient Care Team: Dorothyann Peng, MD as PCP - General (Internal Medicine) Christell Constant, MD as PCP - Cardiology (Cardiology)  Indicate any recent Medical Services you may have received from other than [facility] providers in the past year (date may be approximate).     Assessment:    This is a routine wellness examination for Sierra Glover.  Hearing/Vision screen Hearing Screening - Comments:: Denies hearing issues Vision Screening - Comments:: Regular eye exams, Dr. Alexia Freestone   Goals Addressed             This Visit's Progress    Patient Stated       08/22/2023, wants to find out what is going on with health       Depression Screen     08/22/2023    8:23 AM 02/26/2023    8:37 AM 08/24/2022    9:50 AM 07/12/2022    8:23 AM 06/20/2022    3:17 PM 05/19/2021   10:51 AM 11/13/2019    8:27 AM  PHQ 2/9 Scores  PHQ - 2 Score 0 0 0 0 0 0 0  PHQ- 9 Score 1 0    0     Fall Risk     08/21/2023    8:25 PM 02/26/2023    8:37 AM 08/24/2022    9:49 AM 07/12/2022    8:23 AM 07/11/2022    6:58 PM  Fall Risk   Falls in the past year? 0 0 0 0 0  Number falls in past yr: 0 0 0 0   Injury with Fall? 0 0 0 0   Risk for fall due to : Medication side effect No Fall Risks No Fall Risks Medication side effect   Follow up Falls prevention discussed;Falls evaluation completed Falls evaluation completed Falls evaluation completed Falls prevention discussed;Education provided;Falls evaluation completed     MEDICARE RISK AT HOME:  Medicare Risk at Home Any stairs in or around the home?: (Patient-Rptd) Yes If so, are there any without handrails?: (Patient-Rptd) No Home free of loose throw rugs in walkways, pet beds, electrical cords, etc?: (Patient-Rptd) Yes Adequate lighting in your home to reduce risk of falls?: (Patient-Rptd) Yes Life alert?: (Patient-Rptd) No Use of a cane, walker or w/c?: (Patient-Rptd) No Grab bars in the bathroom?: (Patient-Rptd) No Shower chair or bench in shower?: (Patient-Rptd) No Elevated toilet seat or a handicapped toilet?: (Patient-Rptd)  No  TIMED UP AND GO:  Was the test performed?  No  Cognitive Function: 6CIT completed        08/22/2023    8:24 AM 07/12/2022    8:23 AM 06/07/2021   11:30 AM  6CIT Screen  What Year? 0 points 0 points 0 points  What month?  0 points 0 points 0 points  What time? 0 points 0 points 0 points  Count back from 20 0 points 0 points 0 points  Months in reverse 0 points 0 points 0 points  Repeat phrase 0 points 0 points 0 points  Total Score 0 points 0 points 0 points    Immunizations Immunization History  Administered Date(s) Administered   Moderna Sars-Covid-2 Vaccination 08/20/2019, 09/24/2019   Td 01/16/2007   Tdap 05/08/2018    Screening Tests Health Maintenance  Topic Date Due   COVID-19 Vaccine (3 - Moderna risk series) 10/22/2019   INFLUENZA VACCINE  09/17/2023 (Originally 01/18/2023)   Zoster Vaccines- Shingrix (1 of 2) 10/01/2023 (Originally 11/25/1974)   Pneumonia Vaccine 48+ Years old (1 of 2 - PCV) 02/26/2024 (Originally [birth year]24/1961)   MAMMOGRAM  12/09/2023   Medicare Annual Wellness (AWV)  08/21/2024   Colonoscopy  05/26/2026   DTaP/Tdap/Td (3 - Td or Tdap) 05/08/2028   DEXA SCAN  Completed   Hepatitis C Screening  Completed   HPV VACCINES  Aged Out    Health Maintenance  Health Maintenance Due  Topic Date Due   COVID-19 Vaccine (3 - Moderna risk series) 10/22/2019   Health Maintenance Items Addressed: Declines vaccines . Mammogram requested  Additional Screening:  Vision Screening: Recommended annual ophthalmology exams for early detection of glaucoma and other disorders of the eye.  Dental Screening: Recommended annual dental exams for proper oral hygiene  Community Resource Referral / Chronic Care Management: CRR required this visit?  No   CCM required this visit?  No     Plan:     I have personally reviewed and noted the following in the patient's chart:   Medical and social history Use of alcohol, tobacco or illicit drugs  Current medications and supplements including opioid prescriptions. Patient is not currently taking opioid prescriptions. Functional ability and status Nutritional status Physical activity Advanced directives List of other  physicians Hospitalizations, surgeries, and ER visits in previous 12 months Vitals Screenings to include cognitive, depression, and falls Referrals and appointments  In addition, I have reviewed and discussed with patient certain preventive protocols, quality metrics, and best practice recommendations. A written personalized care plan for preventive services as well as general preventive health recommendations were provided to patient.     Barb Merino, LPN   06/19/9145   After Visit Summary: (MyChart) Due to this being a telephonic visit, the after visit summary with patients personalized plan was offered to patient via MyChart   Notes: Nothing significant to report at this time.

## 2023-08-25 LAB — LIPID PANEL
Chol/HDL Ratio: 2.7 ratio (ref 0.0–4.4)
Cholesterol, Total: 139 mg/dL (ref 100–199)
HDL: 51 mg/dL (ref >39)
LDL Chol Calc (NIH): 74 mg/dL (ref 0–99)
Triglycerides: 67 mg/dL (ref 0–149)
VLDL Cholesterol Cal: 14 mg/dL (ref 5–40)

## 2023-08-25 LAB — ALT: ALT: 16 IU/L (ref 0–32)

## 2023-08-27 ENCOUNTER — Encounter: Payer: Self-pay | Admitting: Internal Medicine

## 2023-08-27 NOTE — Telephone Encounter (Signed)
 Called pt to address concern.  Pt reports went to [facility] to have kidney function checked per Dr. Allyne Gee.  Did not need cholesterol labs drawn.  I advised pt that labs for Dr. Allyne Gee office were not released and this may be why incorrect labs were drawn.  Advised pt to contact [facility] for error to see if they will be able to assist with concern.  Pt expresses appreciation for call back.

## 2023-08-30 ENCOUNTER — Ambulatory Visit: Admitting: Family Medicine

## 2023-08-30 ENCOUNTER — Encounter: Payer: Self-pay | Admitting: Family Medicine

## 2023-08-30 VITALS — BP 160/80 | Temp 101.7°F | Ht 66.0 in | Wt 164.0 lb

## 2023-08-30 DIAGNOSIS — J101 Influenza due to other identified influenza virus with other respiratory manifestations: Secondary | ICD-10-CM | POA: Diagnosis not present

## 2023-08-30 DIAGNOSIS — R509 Fever, unspecified: Secondary | ICD-10-CM

## 2023-08-30 LAB — POCT INFLUENZA A/B
Influenza A, POC: POSITIVE — AB
Influenza B, POC: NEGATIVE

## 2023-08-30 LAB — POC COVID19 BINAXNOW: SARS Coronavirus 2 Ag: NEGATIVE

## 2023-08-30 MED ORDER — OSELTAMIVIR PHOSPHATE 75 MG PO CAPS
75.0000 mg | ORAL_CAPSULE | Freq: Two times a day (BID) | ORAL | 0 refills | Status: AC
Start: 2023-08-30 — End: 2023-09-04

## 2023-08-30 MED ORDER — PROMETHAZINE-DM 6.25-15 MG/5ML PO SYRP
5.0000 mL | ORAL_SOLUTION | Freq: Two times a day (BID) | ORAL | 0 refills | Status: AC | PRN
Start: 2023-08-30 — End: ?

## 2023-08-30 NOTE — Progress Notes (Signed)
 I,Sierra Glover, CMA,acting as a Neurosurgeon for [employer], NP.,have documented all relevant documentation on the behalf of Sierra Hose, NP,as directed by  Sierra Hose, NP while in the presence of Sierra Hose, NP.  Subjective:  Patient ID: Sierra Glover , female    DOB: [birth year] , 68 y.o.   MRN: [id number]  Chief Complaint  Patient presents with   URI    HPI  Patient is a 68 year old female with diagnosis of hypertension presents today for cold symptoms of headache, congestion, dry cough and a fever. She states that her symptoms started a two days ago and she cannot being around anyone who is sick. Patient states that she has not taken any medication today including her BP medication, she denies any nausea,vomiting or diarrhea.  URI  This is a new problem. The current episode started in the past 7 days. The problem has been gradually worsening. The maximum temperature recorded prior to her arrival was 101 - 101.9 F. Associated symptoms include congestion, coughing, headaches and rhinorrhea. Pertinent negatives include no abdominal pain, diarrhea, nausea, sore throat or vomiting. She has tried nothing for the symptoms. The treatment provided no relief.     Past Medical History:  Diagnosis Date   Anemia    before my hysterectomy   Arthritis 2021   Dizziness    DVT (deep venous thrombosis) (HCC)    "Many, many years ago."   HTN (hypertension)    Nausea    Palpitation    Serum calcium elevated      Family History  Problem Relation Age of Onset   Alzheimer's disease Mother    Hypertension Mother    Hypertension Father    COPD Father    Hypertension Other      Current Outpatient Medications:    atorvastatin (LIPITOR) 20 MG tablet, Take 1 tablet (20 mg total) by mouth daily., Disp: 90 tablet, Rfl: 3   B Complex Vitamins (VITAMIN B COMPLEX PO), Take by mouth daily at 6 (six) AM., Disp: , Rfl:    benzonatate (TESSALON PERLES) 100 MG capsule, Take 1 capsule (100 mg total) by  mouth 3 (three) times daily as needed for cough., Disp: 30 capsule, Rfl: 0   ciclopirox (PENLAC) 8 % solution, Apply topically at bedtime. Apply over nail and surrounding skin. Apply daily over previous coat. After seven (7) days, may remove with alcohol and continue cycle., Disp: 6.6 mL, Rfl: 0   diazepam (VALIUM) 2 MG tablet, One tab po 1hr prior to procedure, repeat if needed, Disp: 2 tablet, Rfl: 0   diltiazem (CARDIZEM LA) 240 MG 24 hr tablet, Take 1 tablet (240 mg total) by mouth daily., Disp: 90 tablet, Rfl: 3   fluticasone (FLONASE) 50 MCG/ACT nasal spray, Place 2 sprays into both nostrils daily as needed for allergies or rhinitis., Disp: 48 mL, Rfl: 1   hydrochlorothiazide (MICROZIDE) 12.5 MG capsule, Take 1 capsule (12.5 mg total) by mouth daily., Disp: 30 capsule, Rfl: 1   meclizine (ANTIVERT) 25 MG tablet, Take 1 tablet (25 mg total) by mouth 2 (two) times daily as needed for dizziness., Disp: 30 tablet, Rfl: 0   ondansetron (ZOFRAN ODT) 8 MG disintegrating tablet, Take 1 tablet (8 mg total) by mouth every 8 (eight) hours as needed for nausea or vomiting., Disp: 20 tablet, Rfl: 0   oseltamivir (TAMIFLU) 75 MG capsule, Take 1 capsule (75 mg total) by mouth 2 (two) times daily for 5 days., Disp: 10 capsule, Rfl: 0  pantoprazole (PROTONIX) 40 MG tablet, Take 1 tablet (40 mg total) by mouth daily., Disp: 90 tablet, Rfl: 3   promethazine-dextromethorphan (PROMETHAZINE-DM) 6.25-15 MG/5ML syrup, Take 5 mLs by mouth 2 (two) times daily as needed., Disp: 118 mL, Rfl: 0   valACYclovir (VALTREX) 500 MG tablet, take 1 tablet by oral route  every day (Patient taking differently: as needed (cold sores). take 1 tablet by oral route  every day), Disp: 90 tablet, Rfl: 0   valsartan (DIOVAN) 160 MG tablet, Take 1 tablet (160 mg total) by mouth 2 (two) times daily., Disp: 60 tablet, Rfl: 1   Zinc Oxide-Vitamin C (ZINC PLUS VITAMIN C PO), Take by mouth as needed., Disp: , Rfl:    loratadine (CLARITIN) 10 MG  tablet, Take 1 tablet (10 mg total) by mouth daily. (Patient taking differently: Take 10 mg by mouth as needed for allergies.), Disp: 30 tablet, Rfl: 2   No Known Allergies   Review of Systems  Constitutional:  Positive for chills and fever.  HENT:  Positive for congestion and rhinorrhea. Negative for sore throat.   Eyes: Negative.   Respiratory:  Positive for cough.   Cardiovascular: Negative.   Gastrointestinal: Negative.  Negative for abdominal pain, diarrhea, nausea and vomiting.  Neurological:  Positive for headaches.     Today's Vitals   08/30/23 1411  BP: (!) 160/80  Temp: (!) 101.7 F (38.7 C)  TempSrc: Oral  Weight: 164 lb (74.4 kg)  Height: 5\' 6"  (1.676 m)  PainSc: 0-No pain   Body mass index is 26.47 kg/m.  Wt Readings from Last 3 Encounters:  08/30/23 164 lb (74.4 kg)  07/19/23 157 lb (71.2 kg)  07/03/23 157 lb 3.2 oz (71.3 kg)    The 10-year ASCVD risk score (Arnett DK, et al., 2019) is: 12%   Values used to calculate the score:     Age: 6 years     Sex: Female     Is Non-Hispanic African American: Yes     Diabetic: No     Tobacco smoker: No     Systolic Blood Pressure: 160 mmHg     Is BP treated: Yes     HDL Cholesterol: 51 mg/dL     Total Cholesterol: 139 mg/dL  Objective:  Physical Exam HENT:     Head: Normocephalic.  Cardiovascular:     Rate and Rhythm: Normal rate and regular rhythm.  Pulmonary:     Effort: Pulmonary effort is normal.     Breath sounds: Normal breath sounds.  Skin:    General: Skin is warm.  Neurological:     Mental Status: She is alert and oriented to person, place, and time.         Assessment And Plan:  Fever, unspecified fever cause Assessment & Plan: Take OTC analgesia e.g Acetaminophen as needed for fever every 4-6 hours  Orders: -     POC COVID-19 BinaxNow -     POCT Influenza A/B  Influenza A -     Oseltamivir Phosphate; Take 1 capsule (75 mg total) by mouth 2 (two) times daily for 5 days.  Dispense: 10  capsule; Refill: 0 -     Promethazine-DM; Take 5 mLs by mouth 2 (two) times daily as needed.  Dispense: 118 mL; Refill: 0    Return if symptoms worsen or fail to improve.  Patient was given opportunity to ask questions. Patient verbalized understanding of the plan and was able to repeat key elements of the plan. All questions  were answered to their satisfaction.   I, Sierra Hose, NP, have reviewed all documentation for this visit. The documentation on 09/04/2023 for the exam, diagnosis, procedures, and orders are all accurate and complete.    IF YOU HAVE BEEN REFERRED TO A SPECIALIST, IT MAY TAKE 1-2 WEEKS TO SCHEDULE/PROCESS THE REFERRAL. IF YOU HAVE NOT HEARD FROM US/SPECIALIST IN TWO WEEKS, PLEASE GIVE [place] A CALL AT [phone].

## 2023-09-04 NOTE — Assessment & Plan Note (Signed)
 Take OTC analgesia e.g Acetaminophen as needed for fever every 4-6 hours

## 2023-09-05 ENCOUNTER — Ambulatory Visit: Payer: Medicare Other | Admitting: Internal Medicine

## 2023-09-05 ENCOUNTER — Encounter: Payer: Self-pay | Admitting: Internal Medicine

## 2023-09-05 VITALS — BP 120/70 | HR 69 | Temp 98.5°F | Ht 66.0 in | Wt 157.0 lb

## 2023-09-05 DIAGNOSIS — G9331 Postviral fatigue syndrome: Secondary | ICD-10-CM | POA: Diagnosis not present

## 2023-09-05 DIAGNOSIS — K219 Gastro-esophageal reflux disease without esophagitis: Secondary | ICD-10-CM | POA: Diagnosis not present

## 2023-09-05 DIAGNOSIS — I10 Essential (primary) hypertension: Secondary | ICD-10-CM | POA: Diagnosis not present

## 2023-09-05 DIAGNOSIS — Z0000 Encounter for general adult medical examination without abnormal findings: Secondary | ICD-10-CM | POA: Diagnosis not present

## 2023-09-05 DIAGNOSIS — E7800 Pure hypercholesterolemia, unspecified: Secondary | ICD-10-CM | POA: Diagnosis not present

## 2023-09-05 LAB — POCT URINALYSIS DIPSTICK
Bilirubin, UA: NEGATIVE
Blood, UA: NEGATIVE
Glucose, UA: NEGATIVE
Ketones, UA: NEGATIVE
Leukocytes, UA: NEGATIVE
Nitrite, UA: NEGATIVE
Protein, UA: NEGATIVE
Spec Grav, UA: 1.01 (ref 1.010–1.025)
Urobilinogen, UA: 0.2 U/dL
pH, UA: 7 (ref 5.0–8.0)

## 2023-09-05 MED ORDER — VALSARTAN-HYDROCHLOROTHIAZIDE 320-12.5 MG PO TABS: 1.0000 | ORAL_TABLET | Freq: Every day | ORAL | 2 refills | Status: DC

## 2023-09-05 MED ORDER — CYANOCOBALAMIN 1000 MCG/ML IJ SOLN
1000.0000 ug | Freq: Once | INTRAMUSCULAR | Status: AC
Start: 2023-09-05 — End: 2023-09-05
  Administered 2023-09-05: 1000 ug via INTRAMUSCULAR

## 2023-09-05 MED ORDER — FLUTICASONE PROPIONATE 50 MCG/ACT NA SUSP: 2.0000 | Freq: Every day | NASAL | 1 refills | Status: AC | PRN

## 2023-09-05 NOTE — Patient Instructions (Signed)

## 2023-09-05 NOTE — Progress Notes (Signed)
 I,Sierra Glover, CMA,acting as a Neurosurgeon for Sierra Aliment, MD.,have documented all relevant documentation on the behalf of Sierra Aliment, MD,as directed by  Sierra Aliment, MD while in the presence of Sierra Aliment, MD.  Subjective:    Patient ID: Sierra Glover , female    DOB: 1956-04-04 , 68 y.o.   MRN: [id number]  Chief Complaint  Patient presents with   Annual Exam   Hypertension   Hyperlipidemia    HPI  Patient presents today for her annual exam. She is no longer followed by GYN. She reports compliance with meds. She denies headache, SOB, chest pain, and blurred vision. She has no specific questions or concerns.    Hypertension This is a chronic problem. The current episode started more than 1 year ago. The problem has been gradually improving since onset. The problem is controlled. Pertinent negatives include no blurred vision, chest pain, palpitations or shortness of breath. Past treatments include diuretics. The current treatment provides moderate improvement.     Past Medical History:  Diagnosis Date   Anemia    before my hysterectomy   Arthritis 2021   Dizziness    DVT (deep venous thrombosis) (HCC)    "Many, many years ago."   HTN (hypertension)    Nausea    Palpitation    Serum calcium elevated      Family History  Problem Relation Age of Onset   Alzheimer's disease Mother    Hypertension Mother    Hypertension Father    COPD Father    Hypertension Other      Current Outpatient Medications:    atorvastatin (LIPITOR) 20 MG tablet, Take 1 tablet (20 mg total) by mouth daily., Disp: 90 tablet, Rfl: 3   B Complex Vitamins (VITAMIN B COMPLEX PO), Take by mouth daily at 6 (six) AM., Disp: , Rfl:    benzonatate (TESSALON PERLES) 100 MG capsule, Take 1 capsule (100 mg total) by mouth 3 (three) times daily as needed for cough., Disp: 30 capsule, Rfl: 0   ciclopirox (PENLAC) 8 % solution, Apply topically at bedtime. Apply over nail and surrounding  skin. Apply daily over previous coat. After seven (7) days, may remove with alcohol and continue cycle., Disp: 6.6 mL, Rfl: 0   diltiazem (CARDIZEM LA) 240 MG 24 hr tablet, Take 1 tablet (240 mg total) by mouth daily., Disp: 90 tablet, Rfl: 3   meclizine (ANTIVERT) 25 MG tablet, Take 1 tablet (25 mg total) by mouth 2 (two) times daily as needed for dizziness., Disp: 30 tablet, Rfl: 0   ondansetron (ZOFRAN ODT) 8 MG disintegrating tablet, Take 1 tablet (8 mg total) by mouth every 8 (eight) hours as needed for nausea or vomiting., Disp: 20 tablet, Rfl: 0   pantoprazole (PROTONIX) 40 MG tablet, Take 1 tablet (40 mg total) by mouth daily., Disp: 90 tablet, Rfl: 3   promethazine-dextromethorphan (PROMETHAZINE-DM) 6.25-15 MG/5ML syrup, Take 5 mLs by mouth 2 (two) times daily as needed., Disp: 118 mL, Rfl: 0   valACYclovir (VALTREX) 500 MG tablet, take 1 tablet by oral route  every day (Patient taking differently: as needed (cold sores). take 1 tablet by oral route  every day), Disp: 90 tablet, Rfl: 0   valsartan-hydrochlorothiazide (DIOVAN-HCT) 320-12.5 MG tablet, Take 1 tablet by mouth daily., Disp: 90 tablet, Rfl: 2   Zinc Oxide-Vitamin C (ZINC PLUS VITAMIN C PO), Take by mouth as needed., Disp: , Rfl:    fluticasone (FLONASE) 50 MCG/ACT nasal spray,  Place 2 sprays into both nostrils daily as needed for allergies or rhinitis., Disp: 48 mL, Rfl: 1   loratadine (CLARITIN) 10 MG tablet, Take 1 tablet (10 mg total) by mouth daily. (Patient taking differently: Take 10 mg by mouth as needed for allergies.), Disp: 30 tablet, Rfl: 2   No Known Allergies    The patient states she uses post menopausal status for birth control. No LMP recorded. Patient has had a hysterectomy.. Negative for Dysmenorrhea. Negative for: breast discharge, breast lump(s), breast pain and breast self exam. Associated symptoms include abnormal vaginal bleeding. Pertinent negatives include abnormal bleeding (hematology), anxiety, decreased  libido, depression, difficulty falling sleep, dyspareunia, history of infertility, nocturia, sexual dysfunction, sleep disturbances, urinary incontinence, urinary urgency, vaginal discharge and vaginal itching. Diet regular.The patient states her exercise level is  moderate.  . The patient's tobacco use is:  Social History   Tobacco Use  Smoking Status Never  Smokeless Tobacco Never  Tobacco Comments   N/a  . She has been exposed to passive smoke. The patient's alcohol use is:  Social History   Substance and Sexual Activity  Alcohol Use Not Currently   Comment: Occasionally,  not weekly    Review of Systems  Constitutional:  Positive for fatigue.       She was treated for the flu last week. She has been feeling fatigued. 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   HENT: Negative.    Eyes: Negative.  Negative for blurred vision.  Respiratory: Negative.  Negative for shortness of breath.   Cardiovascular: Negative.  Negative for chest pain and palpitations.  Gastrointestinal: Negative.   Endocrine: Negative.   Genitourinary: Negative.   Musculoskeletal: Negative.   Skin: Negative.   Allergic/Immunologic: Negative.   Neurological: Negative.   Hematological: Negative.   Psychiatric/Behavioral: Negative.       Today's Vitals   09/05/23 0910  BP: 120/70  Pulse: 69  Temp: 98.5 F (36.9 C)  SpO2: 98%  Weight: 157 lb (71.2 kg)  Height: 5\' 6"  (1.676 m)   Body mass index is 25.34 kg/m.  Wt Readings from Last 3 Encounters:  09/05/23 157 lb (71.2 kg)  08/30/23 164 lb (74.4 kg)  07/19/23 157 lb (71.2 kg)     Objective:  Physical Exam Vitals and nursing note reviewed.  Constitutional:      Appearance: Normal appearance.  HENT:     Head: Normocephalic and atraumatic.     Right Ear: Tympanic membrane, ear canal and external ear normal.     Left Ear: Tympanic membrane, ear canal and external ear normal.     Nose:     Comments: Masked     Mouth/Throat:     Comments: Masked   Eyes:     Extraocular Movements: Extraocular movements intact.     Conjunctiva/sclera: Conjunctivae normal.     Pupils: Pupils are equal, round, and reactive to light.  Cardiovascular:     Rate and Rhythm: Normal rate and regular rhythm.     Pulses: Normal pulses.          Dorsalis pedis pulses are 2+ on the right side and 2+ on the left side.     Heart sounds: Normal heart sounds.  Pulmonary:     Effort: Pulmonary effort is normal.     Breath sounds: Normal breath sounds.  Chest:  Breasts:    Tanner Score is 5.     Right: Normal.     Left: Normal.  Abdominal:  General: Abdomen is flat. Bowel sounds are normal.     Palpations: Abdomen is soft.  Genitourinary:    Comments: deferred Musculoskeletal:        General: Normal range of motion.     Cervical back: Normal range of motion and neck supple.  Skin:    General: Skin is warm and dry.  Neurological:     General: No focal deficit present.     Mental Status: She is alert and oriented to person, place, and time.  Psychiatric:        Mood and Affect: Mood normal.        Behavior: Behavior normal.         Assessment And Plan:     Encounter for annual health examination Assessment & Plan: A full exam was performed.  Importance of monthly self breast exams was discussed with the patient.  She is advised to get 30-45 minutes of regular exercise, no less than four to five days per week. Both weight-bearing and aerobic exercises are recommended.  She is advised to follow a healthy diet with at least six fruits/veggies per day, decrease intake of red meat and other saturated fats and to increase fish intake to twice weekly.  Meats/fish should not be fried -- baked, boiled or broiled is preferable. It is also important to cut back on your sugar intake.  Be sure to read labels - try to avoid anything with added sugar, high fructose corn syrup or other sweeteners.  If you must use a sweetener, you can try stevia or monkfruit.  It is  also important to avoid artificially sweetened foods/beverages and diet drinks. Lastly, wear SPF 50 sunscreen on exposed skin and when in direct sunlight for an extended period of time.  Be sure to avoid fast food restaurants and aim for at least 60 ounces of water daily.       Essential hypertension, benign Assessment & Plan: Chronic, well controlled.  She is surprised her BP is this low because she checked her BP today and it was higher than this.  EKG performed, NSR w/o acute changes.  She is reminded to follow low sodium diet. She is encouraged to decrease intake of processed meats including bacon, sausages and deli meats, breads, cheeses, and pizza.  She will continue with valsartan 320mg  tabs daily and diltiazem nightly.   Orders: -     POCT urinalysis dipstick -     Microalbumin / creatinine urine ratio -     EKG 12-Lead -     BMP8+eGFR -     TSH  Pure hypercholesterolemia Assessment & Plan: Chronic, currently on atorvastatin 20mg  daily. She is encouraged to follow a heart healthy lifestyle. She will f/u in 6 months, also followed by Cardiology.    Gastroesophageal reflux disease without esophagitis Assessment & Plan: Chronic, she will continue with pantoprazole 40mg  daily. Reminded to stop eating 3 hours prior to lying down.    Postviral fatigue syndrome -     Cyanocobalamin  Other orders -     Fluticasone Propionate; Place 2 sprays into both nostrils daily as needed for allergies or rhinitis.  Dispense: 48 mL; Refill: 1 -     Valsartan-hydroCHLOROthiazide; Take 1 tablet by mouth daily.  Dispense: 90 tablet; Refill: 2     Return for 1 YEAR HM, 4 month bp check. Patient was given opportunity to ask questions. Patient verbalized understanding of the plan and was able to repeat key elements of the plan. All questions  were answered to their satisfaction.   I, Sierra Aliment, MD, have reviewed all documentation for this visit. The documentation on 09/09/23 for the exam,  diagnosis, procedures, and orders are all accurate and complete.

## 2023-09-06 LAB — BMP8+EGFR
BUN/Creatinine Ratio: 12 (ref 12–28)
BUN: 10 mg/dL (ref 8–27)
CO2: 26 mmol/L (ref 20–29)
Calcium: 9.8 mg/dL (ref 8.7–10.3)
Chloride: 98 mmol/L (ref 96–106)
Creatinine, Ser: 0.82 mg/dL (ref 0.57–1.00)
Glucose: 91 mg/dL (ref 70–99)
Potassium: 3.7 mmol/L (ref 3.5–5.2)
Sodium: 138 mmol/L (ref 134–144)
eGFR: 78 mL/min/{1.73_m2} (ref >59)

## 2023-09-06 LAB — MICROALBUMIN / CREATININE URINE RATIO
Creatinine, Urine: 49 mg/dL
Microalb/Creat Ratio: 6 mg/g{creat} (ref 0–29)
Microalbumin, Urine: 3.1 ug/mL

## 2023-09-06 LAB — TSH: TSH: 1.17 u[IU]/mL (ref 0.450–4.500)

## 2023-09-09 DIAGNOSIS — Z0000 Encounter for general adult medical examination without abnormal findings: Secondary | ICD-10-CM | POA: Insufficient documentation

## 2023-09-09 NOTE — Assessment & Plan Note (Signed)
 Chronic, she will continue with pantoprazole 40mg  daily. Reminded to stop eating 3 hours prior to lying down.

## 2023-09-09 NOTE — Assessment & Plan Note (Signed)

## 2023-09-09 NOTE — Assessment & Plan Note (Signed)
 Chronic, currently on atorvastatin 20mg  daily. She is encouraged to follow a heart healthy lifestyle. She will f/u in 6 months, also followed by Cardiology.

## 2023-09-09 NOTE — Assessment & Plan Note (Signed)
 Chronic, well controlled.  She is surprised her BP is this low because she checked her BP today and it was higher than this.  EKG performed, NSR w/o acute changes.  She is reminded to follow low sodium diet. She is encouraged to decrease intake of processed meats including bacon, sausages and deli meats, breads, cheeses, and pizza.  She will continue with valsartan 320mg  tabs daily and diltiazem nightly.

## 2023-09-11 ENCOUNTER — Other Ambulatory Visit: Payer: Self-pay | Admitting: Internal Medicine

## 2023-09-28 ENCOUNTER — Other Ambulatory Visit: Payer: Self-pay | Admitting: Internal Medicine

## 2023-10-03 ENCOUNTER — Encounter: Payer: Self-pay | Admitting: Internal Medicine

## 2023-10-03 ENCOUNTER — Ambulatory Visit: Payer: Self-pay

## 2023-10-03 NOTE — Telephone Encounter (Signed)
 Copied from CRM [id number]. Topic: Clinical - Medical Advice >> Oct 03, 2023 10:20 AM Rosaria Common wrote: Reason for CRM: Pt takes 2 different BP pills, and nail is inserted in knee area and it is swollen, went to urgent care on Sunday and possible arthritis. Methylprednisolone 4mg  given(21 tablets). Wants to know if this medication is ok to take while on BP pills.   Patient reports she was seen by her orthopedist for knee pain and was prescribed Methylprednisolone. She has concerns about taking it with her current blood pressure medications. I advised the patient that she could contact her pharmacist because they would be able to discuss drug interactions with her. She is agreeable with this but would still like a response from the clinic to make sure it is okay to take with her current medications.    Reason for Disposition  [1] Caller has medicine question about med NOT prescribed by PCP AND [2] triager unable to answer question (e.g., compatibility with other med, storage)  Answer Assessment - Initial Assessment Questions 1. NAME of MEDICINE: "What medicine(s) are you calling about?"     Methylprednisolone  2. QUESTION: "What is your question?" (e.g., double dose of medicine, side effect)     Is it okay to take with his medications.  Protocols used: Medication Question Call-A-AH

## 2023-10-31 ENCOUNTER — Other Ambulatory Visit: Payer: Self-pay | Admitting: Obstetrics and Gynecology

## 2023-10-31 DIAGNOSIS — Z1231 Encounter for screening mammogram for malignant neoplasm of breast: Secondary | ICD-10-CM

## 2023-11-02 ENCOUNTER — Telehealth: Payer: Self-pay | Admitting: Internal Medicine

## 2023-11-02 ENCOUNTER — Other Ambulatory Visit: Payer: Self-pay

## 2023-11-02 MED ORDER — DILTIAZEM HCL ER 240 MG PO TB24
240.0000 mg | ORAL_TABLET | Freq: Every day | ORAL | 2 refills | Status: DC
Start: 2023-11-02 — End: 2023-11-02

## 2023-11-02 MED ORDER — DILTIAZEM HCL ER 240 MG PO TB24: 240.0000 mg | ORAL_TABLET | Freq: Every day | ORAL | 1 refills | Status: DC

## 2023-11-02 NOTE — Telephone Encounter (Signed)
 Pt's medication was sent to pt's pharmacy as requested. Confirmation received.

## 2023-11-02 NOTE — Telephone Encounter (Signed)
*  STAT* If patient is at the pharmacy, call can be transferred to refill team.   1. Which medications need to be refilled? (please list name of each medication and dose if known) diltiazem  (CARDIZEM  LA) 240 MG 24 hr tablet   2. Which pharmacy/location (including street and city if local pharmacy) is medication to be sent to? [facility], [place] - [place], [place] - [address] Phone: [phone]  Fax: [phone]      3. Do they need a 30 day or 90 day supply? 90 Pt only has 2 does left

## 2023-11-03 ENCOUNTER — Encounter: Payer: Self-pay | Admitting: Internal Medicine

## 2023-11-05 MED ORDER — DILTIAZEM HCL ER COATED BEADS 180 MG PO CP24: 180.0000 mg | ORAL_CAPSULE | Freq: Every day | ORAL | 1 refills | Status: DC

## 2023-11-05 NOTE — Telephone Encounter (Signed)
 Please review and advise.

## 2023-11-05 NOTE — Telephone Encounter (Signed)
 Called pt reports took Diltiazem  240 mg PO every day for about 1.5 months.  BP then began to normalize so decreased to 180 mg PO every day.   Reviewed BP readings pt okay to take Diltiazem  180 mg.  Pt reports BP readings with 200 were a typo should be 100's. Script sent to pharmacy of pt choice. Pt will call in if has difficulty managing BP. All questions answered.

## 2023-11-26 ENCOUNTER — Encounter: Payer: Self-pay | Admitting: Family Medicine

## 2023-11-28 ENCOUNTER — Ambulatory Visit: Payer: Self-pay | Admitting: Family Medicine

## 2023-12-26 ENCOUNTER — Ambulatory Visit
Admission: RE | Admit: 2023-12-26 | Discharge: 2023-12-26 | Disposition: A | Discharge status: Home / Self Care | Source: Ambulatory Visit | Attending: Obstetrics and Gynecology | Admitting: Obstetrics and Gynecology

## 2023-12-26 DIAGNOSIS — Z1231 Encounter for screening mammogram for malignant neoplasm of breast: Secondary | ICD-10-CM

## 2024-01-14 ENCOUNTER — Ambulatory Visit: Admitting: Internal Medicine

## 2024-01-21 ENCOUNTER — Ambulatory Visit: Admitting: Internal Medicine

## 2024-01-21 VITALS — BP 124/70 | HR 68 | Temp 98.6°F | Ht 66.0 in | Wt 156.0 lb

## 2024-01-21 DIAGNOSIS — E7800 Pure hypercholesterolemia, unspecified: Secondary | ICD-10-CM | POA: Diagnosis not present

## 2024-01-21 DIAGNOSIS — I10 Essential (primary) hypertension: Secondary | ICD-10-CM

## 2024-01-21 DIAGNOSIS — Z2821 Immunization not carried out because of patient refusal: Secondary | ICD-10-CM

## 2024-01-21 DIAGNOSIS — R159 Full incontinence of feces: Secondary | ICD-10-CM

## 2024-01-21 DIAGNOSIS — R5383 Other fatigue: Secondary | ICD-10-CM | POA: Diagnosis not present

## 2024-01-21 MED ORDER — CYANOCOBALAMIN 1000 MCG/ML IJ SOLN
1000.0000 ug | Freq: Once | INTRAMUSCULAR | Status: AC
  Administered 2024-01-21: 1000 ug via INTRAMUSCULAR

## 2024-01-21 NOTE — Progress Notes (Signed)
 Sierra Glover, CMA,acting as a Neurosurgeon for Sierra LOISE Slocumb, MD.,have documented all relevant documentation on the behalf of Sierra LOISE Slocumb, MD,as directed by  Sierra LOISE Slocumb, MD while in the presence of Sierra LOISE Slocumb, MD.  Subjective:  Patient ID: Sierra Glover , female    DOB: 1956-03-26 , 68 y.o.   MRN: [id number]  Chief Complaint  Patient presents with   Hypertension    Patient presents today for a bpc. Patient reports compliance with her mes. Patient denies having chest pain,sob or headaches at this time. Patient reports after she urinates and she wipes sometimes she will see some stool on the tissue.      HPI Discussed the use of AI scribe software for clinical note transcription with the patient, who gave verbal consent to proceed.  History of Present Illness Sierra Glover is a 68 year old female who presents for a blood pressure check and concerns about stool incontinence.  She reports occasionally noticing stool on tissue after urination. At times, she feels as though she has had a bowel movement when urinating, and occasionally notices stool unexpectedly during household activities. There are no issues with stool or urine retention, and no increased urinary frequency or urgency. No dysuria is present.  She has recently started juicing, which she believes may contribute to the frequency of stool on tissue. The juicer separates pulp from juice, potentially reducing fiber intake. Juicing is inconsistent, lasting a few days at a time, followed by a return to solid foods. Stools are looser during juicing periods.  Current medications include atorvastatin , diltiazem  180 mg, pantoprazole , and valsartan  320/12.5 mg. She has difficulty swallowing the valsartan  pill due to its size and cuts it into four pieces. She recalls previously taking a separate blood pressure pill and diuretic, which were smaller.  She has not worked out with her trainer for about two months due to cost  and has been walking outside, especially since the weather has been cooler. No urinary incontinence with activities such as laughing, coughing, or sneezing.   Hypertension This is a chronic problem. The current episode started more than 1 year ago. The problem has been gradually improving since onset. The problem is controlled. Pertinent negatives include no blurred vision, chest pain, headaches or shortness of breath. Past treatments include diuretics. The current treatment provides moderate improvement.  Hyperlipidemia This is a chronic problem. The current episode started more than 1 year ago. Pertinent negatives include no chest pain or shortness of breath. Current antihyperlipidemic treatment includes statins. The current treatment provides moderate improvement of lipids. Risk factors for coronary artery disease include dyslipidemia, hypertension, post-menopausal and a sedentary lifestyle.     Past Medical History:  Diagnosis Date   Anemia    before my hysterectomy   Arthritis 2021   Dizziness    DVT (deep venous thrombosis) (HCC)    Many, many years ago.   HTN (hypertension)    Nausea    Palpitation    Serum calcium  elevated      Family History  Problem Relation Age of Onset   Alzheimer's disease Mother    Hypertension Mother    Hypertension Father    COPD Father    Hypertension Other      Current Outpatient Medications:    atorvastatin  (LIPITOR) 20 MG tablet, TAKE ONE TABLET BY MOUTH ONCE DAILY, Disp: 90 tablet, Rfl: 3   B Complex Vitamins (VITAMIN B COMPLEX PO), Take by mouth daily at 6 (six) AM.,  Disp: , Rfl:    benzonatate  (TESSALON  PERLES) 100 MG capsule, Take 1 capsule (100 mg total) by mouth 3 (three) times daily as needed for cough., Disp: 30 capsule, Rfl: 0   diltiazem  (CARDIZEM  CD) 180 MG 24 hr capsule, Take 1 capsule (180 mg total) by mouth daily., Disp: 90 capsule, Rfl: 1   fluticasone  (FLONASE ) 50 MCG/ACT nasal spray, Place 2 sprays into both nostrils daily  as needed for allergies or rhinitis., Disp: 48 mL, Rfl: 1   loratadine  (CLARITIN ) 10 MG tablet, Take 1 tablet (10 mg total) by mouth daily. (Patient taking differently: Take 10 mg by mouth as needed for allergies.), Disp: 30 tablet, Rfl: 2   meclizine  (ANTIVERT ) 25 MG tablet, Take 1 tablet (25 mg total) by mouth 2 (two) times daily as needed for dizziness., Disp: 30 tablet, Rfl: 0   ondansetron  (ZOFRAN  ODT) 8 MG disintegrating tablet, Take 1 tablet (8 mg total) by mouth every 8 (eight) hours as needed for nausea or vomiting., Disp: 20 tablet, Rfl: 0   pantoprazole  (PROTONIX ) 40 MG tablet, Take 1 tablet (40 mg total) by mouth daily., Disp: 90 tablet, Rfl: 3   promethazine -dextromethorphan (PROMETHAZINE -DM) 6.25-15 MG/5ML syrup, Take 5 mLs by mouth 2 (two) times daily as needed., Disp: 118 mL, Rfl: 0   valACYclovir  (VALTREX ) 500 MG tablet, take 1 tablet by oral route  every day (Patient taking differently: as needed (cold sores). take 1 tablet by oral route  every day), Disp: 90 tablet, Rfl: 0   valsartan -hydrochlorothiazide  (DIOVAN -HCT) 320-12.5 MG tablet, Take 1 tablet by mouth daily., Disp: 90 tablet, Rfl: 2   Zinc Oxide-Vitamin C (ZINC PLUS VITAMIN C PO), Take by mouth as needed., Disp: , Rfl:    No Known Allergies   Review of Systems  Constitutional:  Positive for fatigue.  Eyes: Negative.  Negative for blurred vision.  Respiratory: Negative.  Negative for shortness of breath.   Cardiovascular: Negative.  Negative for chest pain.  Gastrointestinal: Negative.   Musculoskeletal: Negative.   Skin: Negative.   Neurological:  Negative for headaches.  Psychiatric/Behavioral: Negative.       Today's Vitals   01/21/24 1542 01/21/24 1632  BP: 130/60 124/70  Pulse: 68   Temp: 98.6 F (37 C)   TempSrc: Oral   Weight: 156 lb (70.8 kg)   Height: 5' 6 (1.676 m)   PainSc: 0-No pain    Body mass index is 25.18 kg/m.  Wt Readings from Last 3 Encounters:  01/21/24 156 lb (70.8 kg)  09/05/23  157 lb (71.2 kg)  08/30/23 164 lb (74.4 kg)    BP Readings from Last 3 Encounters:  01/21/24 124/70  09/05/23 120/70  08/30/23 (!) 160/80     The 10-year ASCVD risk score (Arnett DK, et al., 2019) is: 7.3%   Values used to calculate the score:     Age: 45 years     Clincally relevant sex: Female     Is Non-Hispanic African American: Yes     Diabetic: No     Tobacco smoker: No     Systolic Blood Pressure: 124 mmHg     Is BP treated: Yes     HDL Cholesterol: 51 mg/dL     Total Cholesterol: 139 mg/dL  Objective:  Physical Exam Vitals and nursing note reviewed.  Constitutional:      Appearance: Normal appearance.  HENT:     Head: Normocephalic and atraumatic.  Eyes:     Extraocular Movements: Extraocular movements intact.  Cardiovascular:  Rate and Rhythm: Normal rate and regular rhythm.     Heart sounds: Normal heart sounds.  Pulmonary:     Effort: Pulmonary effort is normal.     Breath sounds: Normal breath sounds.  Musculoskeletal:     Cervical back: Normal range of motion.  Skin:    General: Skin is warm.  Neurological:     General: No focal deficit present.     Mental Status: She is alert.  Psychiatric:        Mood and Affect: Mood normal.        Behavior: Behavior normal.       Assessment And Plan:  Essential hypertension, benign Assessment & Plan: Chronic, well controlled.  She will continue with valsartan  320/12.5mg  mg tabs daily and diltiazem  nightly. Valsartan  pill size is a concern due to difficulty swallowing. - Advise checking with pharmacist regarding availability of valsartan  without diuretic and assess pill size before making changes to medication regimen.  Orders: -     CMP14+EGFR  Pure hypercholesterolemia Assessment & Plan: Chronic, currently on atorvastatin  20mg  daily. She is encouraged to follow a heart healthy lifestyle. She will f/u in 6 months, also followed by Cardiology.    Incontinence of feces, unspecified fecal incontinence  type Assessment & Plan: Intermittent fecal incontinence likely due to pelvic floor dysfunction, exacerbated by recent juicing. Differential includes incomplete cleaning and potential pelvic floor weakness. Early intervention with exercises may prevent progression. - Increase fiber intake to firm stools; consider Benefiber every other day initially to assess tolerance. - Encourage pelvic floor exercises and provide resources for online guidance. - Refer to physical therapy for pelvic floor training  Orders: -     Ambulatory referral to Physical Therapy  Other fatigue -     Cyanocobalamin   Herpes zoster vaccination declined   Return if symptoms worsen or fail to improve.  Patient was given opportunity to ask questions. Patient verbalized understanding of the plan and was able to repeat key elements of the plan. All questions were answered to their satisfaction.    I, Sierra LOISE Slocumb, MD, have reviewed all documentation for this visit. The documentation on 01/21/24 for the exam, diagnosis, procedures, and orders are all accurate and complete.   IF YOU HAVE BEEN REFERRED TO A SPECIALIST, IT MAY TAKE 1-2 WEEKS TO SCHEDULE/PROCESS THE REFERRAL. IF YOU HAVE NOT HEARD FROM US /SPECIALIST IN TWO WEEKS, PLEASE GIVE US  A CALL AT [phone].

## 2024-01-21 NOTE — Patient Instructions (Addendum)
 Pelvic floor exercises Pelvic Floor Dysfunction, Female  Pelvic floor dysfunction (PFD) is a condition that results when the group of muscles and connective tissues that support the organs in the pelvis (pelvic floor muscles) do not work well. These muscles and their connections form a sling that supports the colon and bladder. In women, they also support the uterus. PFD causes pelvic floor muscles to be too weak, too tight, or both. In PFD, muscle movements are not coordinated. This may cause bowel or bladder problems. It may also cause pain. What are the causes? This condition may be caused by an injury to the pelvic area or by a weakening of pelvic muscles. This often results from pregnancy and childbirth or other types of strain. In many cases, the exact cause is not known. What increases the risk? The following factors may make you more likely to develop this condition: Having chronic bladder tissue inflammation (interstitial cystitis). Being an older person. Being overweight. History of radiation treatment for cancer in the pelvic region. Previous pelvic surgery, such as removal of the uterus (hysterectomy). What are the signs or symptoms? Symptoms of this condition vary and may include: Bladder symptoms, such as: Trouble starting urination and emptying the bladder. Frequent urinary tract infections. Leaking urine when coughing, laughing, or exercising (stress incontinence). Having to pass urine urgently or frequently. Pain when passing urine. Bowel symptoms, such as: Constipation. Urgent or frequent bowel movements. Incomplete bowel movements. Painful bowel movements. Leaking stool or gas. Unexplained genital or rectal pain. Genital or rectal muscle spasms. Low back pain. Other symptoms may include: A heavy, full, or aching feeling in the vagina. A bulge that protrudes into the vagina. Pain during or after sex. How is this diagnosed? This condition may be diagnosed based  on: Your symptoms and medical history. A physical exam. During the exam, your health care provider may check your pelvic muscles for tightness, spasm, pain, or weakness. This may include a rectal exam and a pelvic exam. In some cases, you may have diagnostic tests, such as: Electrical muscle function tests. Urine flow testing. X-ray tests of bowel function. Ultrasound of the pelvic organs. How is this treated? Treatment for this condition depends on the symptoms. Treatment options include: Physical therapy. This may include Kegel exercises to help relax or strengthen the pelvic floor muscles. Biofeedback. This type of therapy provides feedback on how tight your pelvic floor muscles are so that you can learn to control them. Internal or external massage therapy. A treatment that involves electrical stimulation of the pelvic floor muscles to help control pain (transcutaneous electrical nerve stimulation, or TENS). Sound wave therapy (ultrasound) to reduce muscle spasms. Medicines, such as: Muscle relaxants. Bladder control medicines. Surgery to reconstruct or support pelvic floor muscles may be an option if other treatments do not help. Follow these instructions at home: Activity Do your usual activities as told by your health care provider. Ask your health care provider if you should modify any activities. Do pelvic floor strengthening or relaxing exercises at home as told by your physical therapist. Lifestyle Maintain a healthy weight. Eat foods that are high in fiber, such as beans, whole grains, and fresh fruits and vegetables. Limit foods that are high in fat and processed sugars, such as fried or sweet foods. Manage stress with relaxation techniques such as yoga or meditation. General instructions If you have problems with leakage: Use absorbable pads or wear padded underwear. Wash frequently with mild soap. Keep your genital and anal area as clean  and dry as possible. Ask your  health care provider if you should try a barrier cream to prevent skin irritation. Take warm baths to relieve pelvic muscle tension or spasms. Take over-the-counter and prescription medicines only as told by your health care provider. Keep all follow-up visits. How is this prevented? The cause of PFD is not always known, but there are a few things you can do to reduce the risk of developing this condition, including: Staying at a healthy weight. Getting regular exercise. Managing stress. Contact a health care provider if: Your symptoms are not improving with home care. You have signs or symptoms of PFD that get worse at home. You develop new signs or symptoms. You have signs of a urinary tract infection, such as: Fever. Chills. Increased urinary frequency. A burning feeling when urinating. You have not had a bowel movement in 3 days (constipation). Summary Pelvic floor dysfunction results when the muscles and connective tissues in your pelvic floor do not work well. These muscles and their connections form a sling that supports your colon and bladder. In women, they also support the uterus. PFD may be caused by an injury to the pelvic area or by a weakening of pelvic muscles. PFD causes pelvic floor muscles to be too weak, too tight, or a combination of both. Symptoms may vary from person to person. In most cases, PFD can be treated with physical therapies and medicines. Surgery may be an option if other treatments do not help. This information is not intended to replace advice given to you by your health care provider. Make sure you discuss any questions you have with your health care provider. Document Revised: 10/13/2020 Document Reviewed: 10/13/2020 Elsevier Patient Education  2024 [employer]  Hypertension, Adult Hypertension is another name for high blood pressure. High blood pressure forces your heart to work harder to pump blood. This can cause problems over time. There are  two numbers in a blood pressure reading. There is a top number (systolic) over a bottom number (diastolic). It is best to have a blood pressure that is below 120/80. What are the causes? The cause of this condition is not known. Some other conditions can lead to high blood pressure. What increases the risk? Some lifestyle factors can make you more likely to develop high blood pressure: Smoking. Not getting enough exercise or physical activity. Being overweight. Having too much fat, sugar, calories, or salt (sodium) in your diet. Drinking too much alcohol. Other risk factors include: Having any of these conditions: Heart disease. Diabetes. High cholesterol. Kidney disease. Obstructive sleep apnea. Having a family history of high blood pressure and high cholesterol. Age. The risk increases with age. Stress. What are the signs or symptoms? High blood pressure may not cause symptoms. Very high blood pressure (hypertensive crisis) may cause: Headache. Fast or uneven heartbeats (palpitations). Shortness of breath. Nosebleed. Vomiting or feeling like you may vomit (nauseous). Changes in how you see. Very bad chest pain. Feeling dizzy. Seizures. How is this treated? This condition is treated by making healthy lifestyle changes, such as: Eating healthy foods. Exercising more. Drinking less alcohol. Your doctor may prescribe medicine if lifestyle changes do not help enough and if: Your top number is above 130. Your bottom number is above 80. Your personal target blood pressure may vary. Follow these instructions at home: Eating and drinking  If told, follow the DASH eating plan. To follow this plan: Fill one half of your plate at each meal with fruits and vegetables.  Fill one fourth of your plate at each meal with whole grains. Whole grains include whole-wheat pasta, brown rice, and whole-grain bread. Eat or drink low-fat dairy products, such as skim milk or low-fat yogurt. Fill  one fourth of your plate at each meal with low-fat (lean) proteins. Low-fat proteins include fish, chicken without skin, eggs, beans, and tofu. Avoid fatty meat, cured and processed meat, or chicken with skin. Avoid pre-made or processed food. Limit the amount of salt in your diet to less than 1,500 mg each day. Do not drink alcohol if: Your doctor tells you not to drink. You are pregnant, may be pregnant, or are planning to become pregnant. If you drink alcohol: Limit how much you have to: 0-1 drink a day for women. 0-2 drinks a day for men. Know how much alcohol is in your drink. In the [place] one drink equals one 12 oz bottle of beer (355 mL), one 5 oz glass of wine (148 mL), or one 1 oz glass of hard liquor (44 mL). Lifestyle  Work with your doctor to stay at a healthy weight or to lose weight. Ask your doctor what the best weight is for you. Get at least 30 minutes of exercise that causes your heart to beat faster (aerobic exercise) most days of the week. This may include walking, swimming, or biking. Get at least 30 minutes of exercise that strengthens your muscles (resistance exercise) at least 3 days a week. This may include lifting weights or doing Pilates. Do not smoke or use any products that contain nicotine or tobacco. If you need help quitting, ask your doctor. Check your blood pressure at home as told by your doctor. Keep all follow-up visits. Medicines Take over-the-counter and prescription medicines only as told by your doctor. Follow directions carefully. Do not skip doses of blood pressure medicine. The medicine does not work as well if you skip doses. Skipping doses also puts you at risk for problems. Ask your doctor about side effects or reactions to medicines that you should watch for. Contact a doctor if: You think you are having a reaction to the medicine you are taking. You have headaches that keep coming back. You feel dizzy. You have swelling in your  ankles. You have trouble with your vision. Get help right away if: You get a very bad headache. You start to feel mixed up (confused). You feel weak or numb. You feel faint. You have very bad pain in your: Chest. Belly (abdomen). You vomit more than once. You have trouble breathing. These symptoms may be an emergency. Get help right away. Call 911. Do not wait to see if the symptoms will go away. Do not drive yourself to the hospital. Summary Hypertension is another name for high blood pressure. High blood pressure forces your heart to work harder to pump blood. For most people, a normal blood pressure is less than 120/80. Making healthy choices can help lower blood pressure. If your blood pressure does not get lower with healthy choices, you may need to take medicine. This information is not intended to replace advice given to you by your health care provider. Make sure you discuss any questions you have with your health care provider. Document Revised: 03/24/2021 Document Reviewed: 03/24/2021 Elsevier Patient Education  2024 [employer].

## 2024-01-22 ENCOUNTER — Ambulatory Visit: Payer: Self-pay | Admitting: Internal Medicine

## 2024-01-22 LAB — CMP14+EGFR
ALT: 14 IU/L (ref 0–32)
AST: 26 IU/L (ref 0–40)
Albumin: 4.6 g/dL (ref 3.9–4.9)
Alkaline Phosphatase: 127 IU/L — ABNORMAL HIGH (ref 44–121)
BUN/Creatinine Ratio: 15 (ref 12–28)
BUN: 15 mg/dL (ref 8–27)
Bilirubin Total: 0.9 mg/dL (ref 0.0–1.2)
CO2: 26 mmol/L (ref 20–29)
Calcium: 10.1 mg/dL (ref 8.7–10.3)
Chloride: 99 mmol/L (ref 96–106)
Creatinine, Ser: 0.99 mg/dL (ref 0.57–1.00)
Globulin, Total: 2.6 g/dL (ref 1.5–4.5)
Glucose: 87 mg/dL (ref 70–99)
Potassium: 4 mmol/L (ref 3.5–5.2)
Sodium: 140 mmol/L (ref 134–144)
Total Protein: 7.2 g/dL (ref 6.0–8.5)
eGFR: 62 mL/min/1.73 (ref >59)

## 2024-01-26 DIAGNOSIS — R5383 Other fatigue: Secondary | ICD-10-CM | POA: Insufficient documentation

## 2024-01-26 DIAGNOSIS — R159 Full incontinence of feces: Secondary | ICD-10-CM | POA: Insufficient documentation

## 2024-01-26 NOTE — Assessment & Plan Note (Signed)
 Intermittent fecal incontinence likely due to pelvic floor dysfunction, exacerbated by recent juicing. Differential includes incomplete cleaning and potential pelvic floor weakness. Early intervention with exercises may prevent progression. - Increase fiber intake to firm stools; consider Benefiber every other day initially to assess tolerance. - Encourage pelvic floor exercises and provide resources for online guidance. - Refer to physical therapy for pelvic floor training

## 2024-01-26 NOTE — Assessment & Plan Note (Signed)
 Chronic, well controlled.  She will continue with valsartan  320/12.5mg  mg tabs daily and diltiazem  nightly. Valsartan  pill size is a concern due to difficulty swallowing. - Advise checking with pharmacist regarding availability of valsartan  without diuretic and assess pill size before making changes to medication regimen.

## 2024-01-26 NOTE — Assessment & Plan Note (Signed)
 Chronic, currently on atorvastatin 20mg  daily. She is encouraged to follow a heart healthy lifestyle. She will f/u in 6 months, also followed by Cardiology.

## 2024-02-06 ENCOUNTER — Other Ambulatory Visit: Payer: Self-pay | Admitting: Internal Medicine

## 2024-02-26 ENCOUNTER — Telehealth: Payer: Self-pay | Admitting: Internal Medicine

## 2024-02-26 MED ORDER — PANTOPRAZOLE SODIUM 40 MG PO TBEC: 40.0000 mg | DELAYED_RELEASE_TABLET | Freq: Every day | ORAL | 0 refills | Status: DC

## 2024-02-26 NOTE — Telephone Encounter (Signed)
 RX sent in

## 2024-02-26 NOTE — Telephone Encounter (Signed)
 *  STAT* If patient is at the pharmacy, call can be transferred to refill team.   1. Which medications need to be refilled? (please list name of each medication and dose if known)   pantoprazole  (PROTONIX ) 40 MG tablet   2. Which pharmacy/location (including street and city if local pharmacy) is medication to be sent to?  [facility] - [place], [place] - [address]    3. Do they need a 30 day or 90 day supply?  90  Patient is completely out of medication

## 2024-03-12 ENCOUNTER — Ambulatory Visit: Payer: Self-pay

## 2024-03-12 ENCOUNTER — Ambulatory Visit: Admitting: Family Medicine

## 2024-03-12 ENCOUNTER — Encounter: Payer: Self-pay | Admitting: Family Medicine

## 2024-03-12 VITALS — BP 110/70 | HR 66 | Temp 98.6°F | Ht 66.0 in | Wt 160.0 lb

## 2024-03-12 DIAGNOSIS — R140 Abdominal distension (gaseous): Secondary | ICD-10-CM | POA: Diagnosis not present

## 2024-03-12 DIAGNOSIS — Z2821 Immunization not carried out because of patient refusal: Secondary | ICD-10-CM

## 2024-03-12 DIAGNOSIS — R1084 Generalized abdominal pain: Secondary | ICD-10-CM

## 2024-03-12 MED ORDER — RESTORA PO CAPS: 1.0000 | ORAL_CAPSULE | Freq: Every day | ORAL | 1 refills | Status: AC

## 2024-03-12 NOTE — Telephone Encounter (Signed)
 FYI Only or Action Required?: FYI only for provider.  Patient was last seen in primary care on 01/21/2024 by Jarold Medici, MD.  [facility] Nurse Triage reporting Bloated.  Symptoms began several days ago.  Interventions attempted: Rest, hydration, or home remedies and Dietary changes.  Symptoms are: gradually worsening.  Triage Disposition: See PCP When Office is Open (Within 3 Days)  Patient/caregiver understands and will follow disposition?: Yes  Copied from CRM #[id number]. Topic: Clinical - Red Word Triage >> Mar 12, 2024  8:53 AM Wess RAMAN wrote: Red Word that prompted transfer to Nurse Triage: stomach issues- Stomach is feeling full after she eats and drink. Discomfort. Stomach bigger than normal. Slight nagging pain. Reason for Disposition  [1] MILD SWELLING of abdomen (e.g., looks distended or swollen) AND [2] new-onset or getting worse  Answer Assessment - Initial Assessment Questions 1. SYMPTOM: What's the main symptom you're concerned about? (e.g., abdomen bloating, swelling)     Feels like it it swollen, like it is bigger than normal  2. ONSET: When did abdominal stomach  start?     2-3 days  3. SEVERITY: How bad is the bloating or swelling?     Mild swelling, more noticeable  4. ABDOMEN PAIN:  Is there any abdomen pain? If Yes, ask: How bad is the pain?  (e.g., Scale 0-10; or none, mild, moderate, or severe)     Vague pain, just a little uncomfortable but its a nagging pain  5. RELIEVING AND AGGRAVATING FACTORS: What makes it better or worse? (e.g., certain foods, lactose, medicines)     Eating foods aggravates the pain, better with mint tea  6. GI HISTORY: Do you have any history of stomach or intestine problems? (e.g., bowel obstruction, cancer, irritable bowel)      No history of GI issues  7. CAUSE: What do you think is causing the bloating?      Unsure of cause  8. OTHER SYMPTOMS: Do you have any other symptoms? (e.g., belching, blood in  stool, breathing difficulty, constipation, diarrhea, fever, passing gas, vomiting, weight loss, white of eyes have turned yellow)     No other symptoms  9. PREGNANCY: Is there any chance you are pregnant? When was your last menstrual period?     No  Protocols used: Abdomen Bloating and Swelling-A-AH

## 2024-03-12 NOTE — Progress Notes (Signed)
 I,Jameka J Llittleton, CMA,acting as a Neurosurgeon for [employer], NP.,have documented all relevant documentation on the behalf of Bruna Creighton, NP,as directed by  Bruna Creighton, NP while in the presence of Bruna Creighton, NP.  Subjective:  Patient ID: Sierra Glover , female    DOB: September 28, 1955 , 68 y.o.   MRN: [id number]  Chief Complaint  Patient presents with   Abdominal Pain    Patient presents today for abdominal pain/bloating after eating. Her symptoms started on Sunday morning. She stated she doesn't feel the pain now but she hasn't eaten anything.     HPI Discussed the use of AI scribe software for clinical note transcription with the patient, who gave verbal consent to proceed.  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  She began experiencing abdominal bloating and discomfort on Sunday night after eating. Initially, her stomach felt swollen and uncomfortable without pain. On Monday, she experienced a slight, non-crampy pain lasting about five minutes. The bloating has been persistent for a few months, but the recent tightness and swelling are new.  She has not been exercising regularly but did some crunches on Monday morning, which caused a brief pain that resolved quickly. She is unsure if this is related to her symptoms.  Her diet over the past few days included fish, coleslaw, potato salad, and hush puppies on Sunday, and a bacon, tomato, and mayonnaise sandwich on Monday. She suspects these meals might have triggered her symptoms, as she usually does not eat such foods for breakfast.  She has not taken any medication for the bloating due to concerns about interactions with her blood pressure and cholesterol medications. She is currently taking Protonix  daily for acid reflux, prescribed by her cardiologist due to chest pain associated with long COVID.  No persistent pain, nausea, or  vomiting but reports burping and passing gas, which provide relief. Her bowel movements are regular, though she occasionally feels the urge to defecate without result. She has not noticed any specific foods consistently causing the bloating.      Past Medical History:  Diagnosis Date   Anemia    before my hysterectomy   Arthritis 2021   Dizziness    DVT (deep venous thrombosis) (HCC)    Many, many years ago.   HTN (hypertension)    Nausea    Palpitation    Serum calcium  elevated      Family History  Problem Relation Age of Onset   Alzheimer's disease Mother    Hypertension Mother    Hypertension Father    COPD Father    Hypertension Other      Current Outpatient Medications:    atorvastatin  (LIPITOR) 20 MG tablet, TAKE ONE TABLET BY MOUTH ONCE DAILY, Disp: 90 tablet, Rfl: 3   B Complex Vitamins (VITAMIN B COMPLEX PO), Take by mouth daily at 6 (six) AM., Disp: , Rfl:    benzonatate  (TESSALON  PERLES) 100 MG capsule, Take 1 capsule (100 mg total) by mouth 3 (three) times daily as needed for cough., Disp: 30 capsule, Rfl: 0   diltiazem  (CARDIZEM  CD) 180 MG 24 hr capsule, Take 1 capsule (180 mg total) by mouth daily., Disp: 90 capsule, Rfl: 1   fluticasone  (FLONASE ) 50 MCG/ACT nasal spray, Place 2 sprays into both nostrils daily as needed for allergies or rhinitis., Disp: 48 mL, Rfl: 1   loratadine  (CLARITIN ) 10  MG tablet, Take 1 tablet (10 mg total) by mouth daily. (Patient taking differently: Take 10 mg by mouth as needed for allergies.), Disp: 30 tablet, Rfl: 2   meclizine  (ANTIVERT ) 25 MG tablet, Take 1 tablet (25 mg total) by mouth 2 (two) times daily as needed for dizziness., Disp: 30 tablet, Rfl: 0   ondansetron  (ZOFRAN  ODT) 8 MG disintegrating tablet, Take 1 tablet (8 mg total) by mouth every 8 (eight) hours as needed for nausea or vomiting., Disp: 20 tablet, Rfl: 0   pantoprazole  (PROTONIX ) 40 MG tablet, Take 1 tablet (40 mg total) by mouth daily., Disp: 90 tablet, Rfl:  0   Probiotic Product (RESTORA) CAPS, Take 1 capsule by mouth daily., Disp: 90 capsule, Rfl: 1   promethazine -dextromethorphan (PROMETHAZINE -DM) 6.25-15 MG/5ML syrup, Take 5 mLs by mouth 2 (two) times daily as needed., Disp: 118 mL, Rfl: 0   valACYclovir  (VALTREX ) 500 MG tablet, take 1 tablet by oral route  every day (Patient taking differently: as needed (cold sores). take 1 tablet by oral route  every day), Disp: 90 tablet, Rfl: 0   valsartan -hydrochlorothiazide  (DIOVAN -HCT) 320-12.5 MG tablet, Take 1 tablet by mouth daily., Disp: 90 tablet, Rfl: 2   Zinc Oxide-Vitamin C (ZINC PLUS VITAMIN C PO), Take by mouth as needed., Disp: , Rfl:    No Known Allergies   Review of Systems  Constitutional: Negative.   HENT: Negative.    Gastrointestinal:  Positive for abdominal pain.  Skin: Negative.   Neurological: Negative.      Today's Vitals   03/12/24 1623  BP: 110/70  Pulse: 66  Temp: 98.6 F (37 C)  Weight: 160 lb (72.6 kg)  Height: 5' 6 (1.676 m)  PainSc: 0-No pain   Body mass index is 25.82 kg/m.  Wt Readings from Last 3 Encounters:  03/12/24 160 lb (72.6 kg)  01/21/24 156 lb (70.8 kg)  09/05/23 157 lb (71.2 kg)    The 10-year ASCVD risk score (Arnett DK, et al., 2019) is: 5.7%   Values used to calculate the score:     Age: 31 years     Clincally relevant sex: Female     Is Non-Hispanic African American: Yes     Diabetic: No     Tobacco smoker: No     Systolic Blood Pressure: 110 mmHg     Is BP treated: Yes     HDL Cholesterol: 51 mg/dL     Total Cholesterol: 139 mg/dL  Objective:  Physical Exam Constitutional:      Appearance: Normal appearance.  HENT:     Head: Normocephalic.  Cardiovascular:     Rate and Rhythm: Normal rate and regular rhythm.     Pulses: Normal pulses.     Heart sounds: Normal heart sounds.  Pulmonary:     Effort: Pulmonary effort is normal.     Breath sounds: Normal breath sounds.  Abdominal:     General: Bowel sounds are normal.   Neurological:     Mental Status: She is alert.         Assessment And Plan:  Abdominal bloating -     Restora; Take 1 capsule by mouth daily.  Dispense: 90 capsule; Refill: 1  Influenza vaccination declined    Return if symptoms worsen or fail to improve, for keep next appt.  Patient was given opportunity to ask questions. Patient verbalized understanding of the plan and was able to repeat key elements of the plan. All questions were answered to their satisfaction.  I, Bruna Creighton, NP, have reviewed all documentation for this visit. The documentation on 03/24/2024 for the exam, diagnosis, procedures, and orders are all accurate and complete.   IF YOU HAVE BEEN REFERRED TO A SPECIALIST, IT MAY TAKE 1-2 WEEKS TO SCHEDULE/PROCESS THE REFERRAL. IF YOU HAVE NOT HEARD FROM US /SPECIALIST IN TWO WEEKS, PLEASE GIVE US  A CALL AT [phone] X [phone].

## 2024-03-24 ENCOUNTER — Encounter: Payer: Self-pay | Admitting: Family Medicine

## 2024-03-24 DIAGNOSIS — Z2821 Immunization not carried out because of patient refusal: Secondary | ICD-10-CM | POA: Insufficient documentation

## 2024-03-24 DIAGNOSIS — R140 Abdominal distension (gaseous): Secondary | ICD-10-CM | POA: Insufficient documentation

## 2024-03-27 ENCOUNTER — Other Ambulatory Visit: Payer: Self-pay

## 2024-03-27 ENCOUNTER — Encounter: Payer: Self-pay | Admitting: Physical Therapy

## 2024-03-27 ENCOUNTER — Ambulatory Visit: Attending: Internal Medicine | Admitting: Physical Therapy

## 2024-03-27 DIAGNOSIS — M6281 Muscle weakness (generalized): Secondary | ICD-10-CM | POA: Diagnosis present

## 2024-03-27 DIAGNOSIS — R279 Unspecified lack of coordination: Secondary | ICD-10-CM | POA: Diagnosis present

## 2024-03-27 DIAGNOSIS — R159 Full incontinence of feces: Secondary | ICD-10-CM | POA: Insufficient documentation

## 2024-03-27 DIAGNOSIS — R293 Abnormal posture: Secondary | ICD-10-CM | POA: Diagnosis present

## 2024-03-27 NOTE — Addendum Note (Signed)
 Addended by: GRETEL KENDALL C on: 03/27/2024 11:13 AM   Modules accepted: Orders

## 2024-03-27 NOTE — Therapy (Signed)
 OUTPATIENT PHYSICAL THERAPY FEMALE PELVIC EVALUATION   Patient Name: Sierra Glover MRN: [id number] DOB:[birth year], 68 y.o., female Today's Date: 03/27/2024  END OF SESSION:  PT End of Session - 03/27/24 0840     Visit Number 1    Number of Visits 10    Date for Recertification  06/05/24    Authorization Type [employer]    Authorization Time Period shara champagne    PT Start Time 0800    PT Stop Time 0845    PT Time Calculation (min) 45 min    Activity Tolerance Patient tolerated treatment well    Behavior During Therapy [facility] for tasks assessed/performed         Past Medical History:  Diagnosis Date   Anemia    before my hysterectomy   Arthritis 2021   Dizziness    DVT (deep venous thrombosis) (HCC)    Many, many years ago.   HTN (hypertension)    Nausea    Palpitation    Serum calcium  elevated    Past Surgical History:  Procedure Laterality Date   ABDOMINAL HYSTERECTOMY  10 years   COLONOSCOPY     FRACTURE SURGERY  12/2020   partal hysterectomy  2009   TIBIA IM NAIL INSERTION Right 01/07/2021   Procedure: INTRAMEDULLARY (IM) NAIL TIBIAL;  Surgeon: Kendal Franky SQUIBB, MD;  Location: [facility] OR;  Service: Orthopedics;  Laterality: Right;   TUBAL LIGATION     Patient Active Problem List   Diagnosis Date Noted   Abdominal bloating 03/24/2024   Influenza vaccination declined 03/24/2024   Other fatigue 01/26/2024   Incontinence of feces 01/26/2024   Encounter for annual health examination 09/09/2023   Fever 08/30/2023   Influenza A 08/30/2023   Scalp lesion 08/09/2023   Vertigo 08/09/2023   Mid back pain 07/14/2023   Hyperlipidemia LDL goal <70 03/20/2023   Aortic atherosclerosis 03/20/2023   Gastroesophageal reflux disease without esophagitis 02/27/2023   Pure hypercholesterolemia 09/03/2022   History of anemia due to vitamin B12 deficiency 09/03/2022   Coronary artery calcification 03/31/2022   Esophageal dysmotility 03/31/2022   Allergic rhinitis due to other allergen  01/16/2022   Long COVID 06/28/2021   Closed fracture of right distal tibia 01/07/2021   Closed displaced oblique fracture of shaft of right tibia 01/05/2021   Disorder of bone and cartilage 04/03/2019   Brawny scleritis, 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   Localized superficial swelling, mass, or lump 04/03/2019   Essential hypertension, benign 05/12/2018   Female climacteric state 05/12/2018   Abnormal glucose level 05/27/2013   Menopausal symptom 05/27/2013   Vitamin D  deficiency 11/17/2010   PCP: Jarold Medici, MD   REFERRING PROVIDER: Jarold Medici, MD   REFERRING DIAG: R15.9 (ICD-10-CM) - Incontinence of feces, unspecified fecal incontinence type  THERAPY DIAG:  Muscle weakness (generalized)  Unspecified lack of coordination  Abnormal posture  Rationale for Evaluation and Treatment: Rehabilitation  ONSET DATE: 3-4 months   SUBJECTIVE:  SUBJECTIVE STATEMENT: Eval: Patient reports that when she goes to void, sometimes she will wipe and she will see fecal smearing on the toilet paper. This does not happen every time she voids. When she has firm stools, she does not experience this. When she eats foods that make her stool softer, she notices the fecal smearing.  Fluid intake: coffee in the mornings, tea, 1 bottle of water per day (16.9 fl oz) sometimes 2-3 if she is doing better   FUNCTIONAL LIMITATIONS: this affects her when she is out and about   PERTINENT HISTORY:  Medications for current condition: OTC medication for bloating  Surgeries: None Other: N/A Sexual abuse: No  PAIN:  Are you having pain? No NPRS scale: 0/10  PRECAUTIONS: None  RED FLAGS: None   WEIGHT BEARING RESTRICTIONS: No  FALLS:  Has patient fallen in last 6 months? 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, seated majority of day   ACTIVITY LEVEL : was walking 1-2 miles in the mornings, but hasn't been able to recently   PLOF: Independent with basic ADLs  PATIENT GOALS: to be better at bowel evacuation, address the pelvic floor musculature   BOWEL MOVEMENT: Pain with bowel movement: No Type of bowel movement:Type (Bristol Stool Scale) with fecal smearing, type 6, type 4 on normal day  , Frequency 1-2x/day, Strain no, and Splinting no Fully empty rectum: Yes:   Leakage: Yes: after a type 6 bowel movement                                                      Caused by: unknown  Pads: Yes: if going out in public  Fiber supplement/laxative No  URINATION: Pain with urination: No Fully empty bladder: Yes:                                   Post-void dribble: No Stream: Strong Urgency: Yes  Frequency:during the day within normal limits                                                           Nocturia: Noonly gets up 1 time per night    Leakage: none Pads/briefs: No  INTERCOURSE: not currently sexually active   PREGNANCY: Vaginal deliveries 1 Tearing No Episiotomy No C-section deliveries 0 Currently pregnant No  PROLAPSE: None  OBJECTIVE:  Note: Objective measures were completed at Evaluation unless otherwise noted.  PATIENT SURVEYS:  PFIQ-7: 45  COGNITION: Overall cognitive status: Within functional limits for tasks assessed     SENSATION: Light touch: Appears intact  LUMBAR SPECIAL TESTS:  Straight leg raise test: Positive and Single leg stance test: Positive  FUNCTIONAL TESTS:  Single leg stance:  Rt: +  Lt: + Sit-up test: 1/3 Squat: within normal limits  Bed mobility: within functional limits   GAIT: Assistive device utilized: None Comments: Mild trendelenburg gait pattern with ambulation   POSTURE: rounded shoulders, forward head, and flexed trunk   LUMBARAROM/PROM: within normal limits for all motions with no pain  LOWER  EXTREMITY  ROM: within normal limits for all motions bilaterally with no pain   LOWER EXTREMITY MMT: 4/5 bilateral knees and hips grossly   PALPATION:  General: no tenderness to palpation of bilateral adductors or hip flexors in supine   Pelvic Alignment: within normal limits   Abdominal: abdominal bracing at rest  Diastasis: No Distortion: No  Breathing: apical breathing pattern with decreased rib excursion with inhalation/exhalation  Scar tissue: No                External Perineal Exam: mild dryness present with anal wink reflex intact; external hemorrhoid present with no redness/bleeding/irritation                              Internal Pelvic Floor: Patient fully consents to today's internal rectal examination. She demonstrates an intact anal wink reflex and has mild dryness at the anal sphincter. She has an external hemorrhoid that is not actively irritated. She demonstrates a lack of coordination in the rectal musculature, which is most likely affecting her ability to empty when she passes softer bowel movements. With internal cueing, patient was able to lengthen her rectum with inhalation and shorten the muscles with exhalation. Small amount of fecal smearing present on glove with removal of finger, indicative of lack of complete rectal emptying with passing Bms. No pain following today's exam.   Patient confirms identification and approves PT to assess internal pelvic floor and treatment Yes No emotional/communication barriers or cognitive limitation. Patient is motivated to learn. Patient understands and agrees with treatment goals and plan. PT explains patient will be examined in standing, sitting, and lying down to see how their muscles and joints work. When they are ready, they will be asked to remove their underwear so PT can examine their perineum. The patient is also given the option of providing their own chaperone as one is not provided in our facility. The patient also has the  right and is explained the right to defer or refuse any part of the evaluation or treatment including the internal exam. With the patient's consent, PT will use one gloved finger to gently assess the muscles of the pelvic floor, seeing how well it contracts and relaxes and if there is muscle symmetry. After, the patient will get dressed and PT and patient will discuss exam findings and plan of care. PT and patient discuss plan of care, schedule, attendance policy and HEP activities.  PELVIC MMT:   MMT eval  Vaginal N/A  Internal Anal Sphincter 4/5  External Anal Sphincter 3/5  Puborectalis 4/5  Diastasis Recti N/A  (Blank rows = not tested)  TONE: Within normal limits   PROLAPSE: N/A  TODAY'S TREATMENT:                                                                                                                              DATE:   EVAL 03/27/24: Examination  completed, findings reviewed, pt educated on POC, HEP, and self care. Pt motivated to participate in PT and agreeable to attempt recommendations.   Hooklying pelvic floor contractions + diaphragmatic breathing 2x10  Hooklying quick flick contractions + diaphragmatic breathing 2x10  Bowel emptying techniques Relative pelvic floor anatomy   PATIENT EDUCATION:  Education details: relative anatomy, water intake recommendation, bladder and bowel irritants, education on how the diaphragm works with the pelvic floor  Person educated: Patient Education method: Explanation, Demonstration, Tactile cues, Verbal cues, and Handouts Education comprehension: verbalized understanding, returned demonstration, verbal cues required, tactile cues required, and needs further education  HOME EXERCISE PROGRAM: Access Code: [email or web address] URL: [email or web address] Date: 03/27/2024 Prepared by: Celena Domino  Exercises - Supine Pelvic Floor Contraction  - 1 x daily - 7 x weekly - 2 sets - 10 reps - Quick Flick Pelvic Floor Contractions in  Hooklying  - 1 x daily - 7 x weekly - 2 sets - 10 reps  Patient Education - Get To Know Your Pelvic Floor- Female - Bowel Emptying Techniques  ASSESSMENT:  CLINICAL IMPRESSION: Patient is a 68 y.o. female  who was seen today for physical therapy evaluation and treatment for fecal incontinence/smearing. Patient fully consents to today's internal rectal examination. Patient reports that when she goes to void, sometimes she will wipe and she will see fecal smearing on the toilet paper, but this does not happen every time she voids. When she has firm stools, she does not experience this. When she eats foods that make her stool softer, she notices the fecal smearing. Lumbar range of motion and strength testing were within normal limits and she demonstrates mild core weakness and uncoordinated pelvic floor/diaphragmatic interaction. She demonstrates an intact anal wink reflex and has mild dryness at the anal sphincter. She has an external hemorrhoid that is not actively irritated. She demonstrates a lack of coordination in the rectal musculature, which is most likely affecting her ability to empty when she passes softer bowel movements. With internal cueing, patient was able to lengthen her rectum with inhalation and shorten the muscles with exhalation. Small amount of fecal smearing present on glove with removal of finger, indicative of lack of complete rectal emptying with passing Bms. No pain following today's exam.   OBJECTIVE IMPAIRMENTS: decreased coordination, decreased endurance, decreased mobility, decreased ROM, and decreased strength.   ACTIVITY LIMITATIONS: continence and toileting  PARTICIPATION LIMITATIONS: community activity  PERSONAL FACTORS: Age and Past/current experiences are also affecting patient's functional outcome.   REHAB POTENTIAL: Good  CLINICAL DECISION MAKING: Stable/uncomplicated  EVALUATION COMPLEXITY: Low   GOALS: Goals reviewed with patient? Yes  SHORT TERM  GOALS: Target date: 04/24/2024  Pt will be independent with HEP.  Baseline: Goal status: INITIAL  2.  Pt will be able to correctly perform diaphragmatic breathing and appropriate pressure management in order to prevent worsening rectal wall laxity and improve pelvic floor A/ROM.  Baseline:  Goal status: INITIAL  3.  Pt will be independent with use of squatty potty, relaxed toileting mechanics, and improved bowel movement techniques in order to increase ease of bowel movements and complete evacuation.  Baseline:  Goal status: INITIAL  LONG TERM GOALS: Target date: 09/25/2024  Pt will be independent with advanced HEP.  Baseline:  Goal status: INITIAL  2.  Pt to demonstrate improved coordination of pelvic floor and breathing mechanics with 10# squat with appropriate synergistic patterns to decrease pain and leakage at least 75% of the time for improved ability to complete a 30  minute workout with strain at pelvic floor and symptoms.   Baseline:  Goal status: INITIAL  3.  Pt will report her BMs are complete due to improved bowel habits and evacuation techniques to prevent fecal smearing when voiding to improve quality of life.  Baseline:  Goal status: INITIAL  PLAN:  PT FREQUENCY: 1-2x/week  PT DURATION: 6 months  PLANNED INTERVENTIONS: 97110-Therapeutic exercises, 97530- Therapeutic activity, 97112- Neuromuscular re-education, 97535- Self Care, [id number]- Manual therapy, Patient/Family education, Balance training, Stair training, Taping, Joint mobilization, Spinal mobilization, Scar mobilization, Cryotherapy, Moist heat, and Biofeedback  PLAN FOR NEXT SESSION: continued pelvic floor AROM in seated, introduce toileting mechanics for optimal emptying, hip/glute/core strengthening; discuss BRAT diet for stool thickening   Celena JAYSON Domino, PT 03/27/2024, 8:41 AM [facility] [address], [address] [place], [place] [address] Phone # [phone] Fax  [phone]

## 2024-04-16 ENCOUNTER — Ambulatory Visit: Payer: Self-pay | Admitting: Physical Therapy

## 2024-04-16 DIAGNOSIS — R293 Abnormal posture: Secondary | ICD-10-CM

## 2024-04-16 DIAGNOSIS — R279 Unspecified lack of coordination: Secondary | ICD-10-CM

## 2024-04-16 DIAGNOSIS — M6281 Muscle weakness (generalized): Secondary | ICD-10-CM | POA: Diagnosis not present

## 2024-04-16 NOTE — Therapy (Signed)
 OUTPATIENT PHYSICAL THERAPY FEMALE PELVIC TREATMENT   Patient Name: Sierra Glover MRN: [id number] DOB:07/17/1955, 68 y.o., female Today's Date: 04/16/2024  END OF SESSION:  PT End of Session - 04/16/24 1251     Visit Number 2    Number of Visits 10    Date for Recertification  06/05/24    Authorization Type [employer]    Authorization Time Period [employer] approved 10 visits 03/27/24-09/22/24 [id number]    Authorization - Visit Number 2    Authorization - Number of Visits 10    PT Start Time 1230    PT Stop Time 1315    PT Time Calculation (min) 45 min    Activity Tolerance Patient tolerated treatment well    Behavior During Therapy [facility] for tasks assessed/performed          Past Medical History:  Diagnosis Date   Anemia    before my hysterectomy   Arthritis 2021   Dizziness    DVT (deep venous thrombosis) (HCC)    Many, many years ago.   HTN (hypertension)    Nausea    Palpitation    Serum calcium  elevated    Past Surgical History:  Procedure Laterality Date   ABDOMINAL HYSTERECTOMY  10 years   COLONOSCOPY     FRACTURE SURGERY  12/2020   partal hysterectomy  2009   TIBIA IM NAIL INSERTION Right 01/07/2021   Procedure: INTRAMEDULLARY (IM) NAIL TIBIAL;  Surgeon: Kendal Franky SQUIBB, MD;  Location: [facility] OR;  Service: Orthopedics;  Laterality: Right;   TUBAL LIGATION     Patient Active Problem List   Diagnosis Date Noted   Abdominal bloating 03/24/2024   Influenza vaccination declined 03/24/2024   Other fatigue 01/26/2024   Incontinence of feces 01/26/2024   Encounter for annual health examination 09/09/2023   Fever 08/30/2023   Influenza A 08/30/2023   Scalp lesion 08/09/2023   Vertigo 08/09/2023   Mid back pain 07/14/2023   Hyperlipidemia LDL goal <70 03/20/2023   Aortic atherosclerosis 03/20/2023   Gastroesophageal reflux disease without esophagitis 02/27/2023   Pure hypercholesterolemia 09/03/2022   History of anemia due to vitamin B12 deficiency 09/03/2022    Coronary artery calcification 03/31/2022   Esophageal dysmotility 03/31/2022   Allergic rhinitis due to other allergen 01/16/2022   Long COVID 06/28/2021   Closed fracture of right distal tibia 01/07/2021   Closed displaced oblique fracture of shaft of right tibia 01/05/2021   Disorder of bone and cartilage 04/03/2019   Brawny scleritis, 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   Localized superficial swelling, mass, or lump 04/03/2019   Essential hypertension, benign 05/12/2018   Female climacteric state 05/12/2018   Abnormal glucose level 05/27/2013   Menopausal symptom 05/27/2013   Vitamin D  deficiency 11/17/2010   PCP: Jarold Medici, MD   REFERRING PROVIDER: Jarold Medici, MD   REFERRING DIAG: R15.9 (ICD-10-CM) - Incontinence of feces, unspecified fecal incontinence type  THERAPY DIAG:  Muscle weakness (generalized)  Unspecified lack of coordination  Abnormal posture  Rationale for Evaluation and Treatment: Rehabilitation  ONSET DATE: 3-4 months   SUBJECTIVE:  SUBJECTIVE STATEMENT: Patient reports that she is doing well today, no pelvic pain to report. Fecal smearing with urination has been the same. No fecal incontinence or urinary incontinence to report otherwise. She lost her HEP sheet and forgot how to do the initial exercises at home and wishes to go over these.   Eval: Patient reports that when she goes to void, sometimes she will wipe and she will see fecal smearing on the toilet paper. This does not happen every time she voids. When she has firm stools, she does not experience this. When she eats foods that make her stool softer, she notices the fecal smearing.  Fluid intake: coffee in the mornings, tea, 1 bottle of water per day (16.9 fl oz) sometimes 2-3 if she is doing better   FUNCTIONAL  LIMITATIONS: this affects her when she is out and about   PERTINENT HISTORY:  Medications for current condition: OTC medication for bloating  Surgeries: None Other: N/A Sexual abuse: No  PAIN:  Are you having pain? No NPRS scale: 0/10  PRECAUTIONS: None  RED FLAGS: None   WEIGHT BEARING RESTRICTIONS: No  FALLS:  Has patient fallen in last 6 months? 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, seated majority of day   ACTIVITY LEVEL : was walking 1-2 miles in the mornings, but hasn't been able to recently   PLOF: Independent with basic ADLs  PATIENT GOALS: to be better at bowel evacuation, address the pelvic floor musculature   BOWEL MOVEMENT: Pain with bowel movement: No Type of bowel movement:Type (Bristol Stool Scale) with fecal smearing, type 6, type 4 on normal day  , Frequency 1-2x/day, Strain no, and Splinting no Fully empty rectum: Yes:   Leakage: Yes: after a type 6 bowel movement                                                      Caused by: unknown  Pads: Yes: if going out in public  Fiber supplement/laxative No  URINATION: Pain with urination: No Fully empty bladder: Yes:                                   Post-void dribble: No Stream: Strong Urgency: Yes  Frequency:during the day within normal limits                                                           Nocturia: Noonly gets up 1 time per night    Leakage: none Pads/briefs: No  INTERCOURSE: not currently sexually active   PREGNANCY: Vaginal deliveries 1 Tearing No Episiotomy No C-section deliveries 0 Currently pregnant No  PROLAPSE: None  OBJECTIVE:  Note: Objective measures were completed at Evaluation unless otherwise noted.  PATIENT SURVEYS:  PFIQ-7: 45  COGNITION: Overall cognitive status: Within functional limits for tasks assessed     SENSATION: Light touch: Appears intact  LUMBAR SPECIAL TESTS:  Straight leg raise test: Positive and  Single leg stance test: Positive  FUNCTIONAL TESTS:  Single leg stance:  Rt: +  Lt: +  Sit-up test: 1/3 Squat: within normal limits  Bed mobility: within functional limits   GAIT: Assistive device utilized: None Comments: Mild trendelenburg gait pattern with ambulation   POSTURE: rounded shoulders, forward head, and flexed trunk   LUMBARAROM/PROM: within normal limits for all motions with no pain  LOWER EXTREMITY ROM: within normal limits for all motions bilaterally with no pain   LOWER EXTREMITY MMT: 4/5 bilateral knees and hips grossly   PALPATION:  General: no tenderness to palpation of bilateral adductors or hip flexors in supine   Pelvic Alignment: within normal limits   Abdominal: abdominal bracing at rest  Diastasis: No Distortion: No  Breathing: apical breathing pattern with decreased rib excursion with inhalation/exhalation  Scar tissue: No                External Perineal Exam: mild dryness present with anal wink reflex intact; external hemorrhoid present with no redness/bleeding/irritation                              Internal Pelvic Floor: Patient fully consents to today's internal rectal examination. She demonstrates an intact anal wink reflex and has mild dryness at the anal sphincter. She has an external hemorrhoid that is not actively irritated. She demonstrates a lack of coordination in the rectal musculature, which is most likely affecting her ability to empty when she passes softer bowel movements. With internal cueing, patient was able to lengthen her rectum with inhalation and shorten the muscles with exhalation. Small amount of fecal smearing present on glove with removal of finger, indicative of lack of complete rectal emptying with passing Bms. No pain following today's exam.   Patient confirms identification and approves PT to assess internal pelvic floor and treatment Yes No emotional/communication barriers or cognitive limitation. Patient is motivated  to learn. Patient understands and agrees with treatment goals and plan. PT explains patient will be examined in standing, sitting, and lying down to see how their muscles and joints work. When they are ready, they will be asked to remove their underwear so PT can examine their perineum. The patient is also given the option of providing their own chaperone as one is not provided in our facility. The patient also has the right and is explained the right to defer or refuse any part of the evaluation or treatment including the internal exam. With the patient's consent, PT will use one gloved finger to gently assess the muscles of the pelvic floor, seeing how well it contracts and relaxes and if there is muscle symmetry. After, the patient will get dressed and PT and patient will discuss exam findings and plan of care. PT and patient discuss plan of care, schedule, attendance policy and HEP activities.  PELVIC MMT:   MMT eval  Vaginal N/A  Internal Anal Sphincter 4/5  External Anal Sphincter 3/5  Puborectalis 4/5  Diastasis Recti N/A  (Blank rows = not tested)  TONE: Within normal limits   PROLAPSE: N/A  TODAY'S TREATMENT:  DATE:   EVAL 03/27/24: Examination completed, findings reviewed, pt educated on POC, HEP, and self care. Pt motivated to participate in PT and agreeable to attempt recommendations.   Hooklying pelvic floor contractions + diaphragmatic breathing 2x10  Hooklying quick flick contractions + diaphragmatic breathing 2x10  Bowel emptying techniques Relative pelvic floor anatomy   04/16/24: Hooklying pelvic floor contraction + diaphragmatic breathing 2x10  Hooklying quick flick pelvic floor contractions + diaphragmatic breathing 2x10  Bridge + adductor ball squeeze + diaphragmatic breathing 2x10  Sit to stand + adductor ball squeeze + diaphragmatic breathing  2x10  Sidelying clamshell + reverse clamshell + diaphragmatic breathing 2x10   PATIENT EDUCATION:  Education details: relative anatomy, water intake recommendation, bladder and bowel irritants, education on how the diaphragm works with the pelvic floor  Person educated: Patient Education method: Explanation, Demonstration, Tactile cues, Verbal cues, and Handouts Education comprehension: verbalized understanding, returned demonstration, verbal cues required, tactile cues required, and needs further education  HOME EXERCISE PROGRAM: Access Code: [email or web address] URL: [email or web address] Date: 04/16/2024 Prepared by: Celena Domino  Exercises - Supine Pelvic Floor Contraction  - 1 x daily - 7 x weekly - 2 sets - 10 reps - Quick Flick Pelvic Floor Contractions in Hooklying  - 1 x daily - 7 x weekly - 2 sets - 10 reps - Supine Bridge with Mini Swiss Ball Between Knees  - 1 x daily - 7 x weekly - 2 sets - 10 reps - Sit to Stand with Mercer Between Knees  - 1 x daily - 7 x weekly - 2 sets - 10 reps - Clamshell  - 1 x daily - 7 x weekly - 2 sets - 10 reps - Sidelying Reverse Clamshell  - 1 x daily - 7 x weekly - 2 sets - 10 reps  Patient Education - Get To Know Your Pelvic Floor- Female - Bowel Emptying Techniques  ASSESSMENT:  CLINICAL IMPRESSION: Patient is a 69 y.o. female  who was seen today for physical therapy treatment for fecal incontinence/smearing. Patient reports no change in bowel smearing symptoms, she also lost her HEP sheet and has not been consistent with exercises. All initial exercises reviewed and pt did very well with this requiring minimal cueing. Introduction to gluteal and adductor strengthening went very well and pt had no pain with these. Overall, pt tolerated session well and Pt would benefit from additional PT to further address deficits.    OBJECTIVE IMPAIRMENTS: decreased coordination, decreased endurance, decreased mobility, decreased ROM, and decreased  strength.   ACTIVITY LIMITATIONS: continence and toileting  PARTICIPATION LIMITATIONS: community activity  PERSONAL FACTORS: Age and Past/current experiences are also affecting patient's functional outcome.   REHAB POTENTIAL: Good  CLINICAL DECISION MAKING: Stable/uncomplicated  EVALUATION COMPLEXITY: Low   GOALS: Goals reviewed with patient? Yes  SHORT TERM GOALS: Target date: 04/24/2024  Pt will be independent with HEP.  Baseline: Goal status: INITIAL  2.  Pt will be able to correctly perform diaphragmatic breathing and appropriate pressure management in order to prevent worsening rectal wall laxity and improve pelvic floor A/ROM.  Baseline:  Goal status: INITIAL  3.  Pt will be independent with use of squatty potty, relaxed toileting mechanics, and improved bowel movement techniques in order to increase ease of bowel movements and complete evacuation.  Baseline:  Goal status: INITIAL  LONG TERM GOALS: Target date: 09/25/2024  Pt will be independent with advanced HEP.  Baseline:  Goal status: INITIAL  2.  Pt to demonstrate improved coordination of pelvic  floor and breathing mechanics with 10# squat with appropriate synergistic patterns to decrease pain and leakage at least 75% of the time for improved ability to complete a 30 minute workout with strain at pelvic floor and symptoms.   Baseline:  Goal status: INITIAL  3.  Pt will report her BMs are complete due to improved bowel habits and evacuation techniques to prevent fecal smearing when voiding to improve quality of life.  Baseline:  Goal status: INITIAL  PLAN:  PT FREQUENCY: 1-2x/week  PT DURATION: 6 months  PLANNED INTERVENTIONS: 97110-Therapeutic exercises, 97530- Therapeutic activity, 97112- Neuromuscular re-education, 97535- Self Care, [id number]- Manual therapy, Patient/Family education, Balance training, Stair training, Taping, Joint mobilization, Spinal mobilization, Scar mobilization, Cryotherapy, Moist heat,  and Biofeedback  PLAN FOR NEXT SESSION: continued pelvic floor AROM in seated, introduce toileting mechanics for optimal emptying, hip/glute/core strengthening; discuss BRAT diet for stool thickening   Celena JAYSON Domino, PT 04/16/2024, 1:08 PM [facility] [address] [place], [place] [address] Phone # [phone] Fax [phone]

## 2024-04-24 ENCOUNTER — Other Ambulatory Visit: Payer: Self-pay | Admitting: Internal Medicine

## 2024-05-22 ENCOUNTER — Telehealth: Payer: Self-pay | Admitting: Internal Medicine

## 2024-05-22 ENCOUNTER — Ambulatory Visit: Admitting: Physical Therapy

## 2024-05-22 MED ORDER — DILTIAZEM HCL ER COATED BEADS 180 MG PO CP24: 180.0000 mg | ORAL_CAPSULE | Freq: Every day | ORAL | 0 refills | Status: AC

## 2024-05-22 NOTE — Telephone Encounter (Signed)
 Requested Prescriptions   Signed Prescriptions Disp Refills   diltiazem  (CARDIZEM  CD) 180 MG 24 hr capsule 90 capsule 0    Sig: Take 1 capsule (180 mg total) by mouth daily.    Authorizing Provider: SANTO KELLY A    Ordering User: WILFRED, Erendida Wrenn  C

## 2024-05-22 NOTE — Telephone Encounter (Signed)
*  STAT* If patient is at the pharmacy, call can be transferred to refill team.   1. Which medications need to be refilled? (please list name of each medication and dose if known)   diltiazem  (CARDIZEM  CD) 180 MG 24 hr capsule    2. Would you like to learn more about the convenience, safety, & potential cost savings by using the [facility]? no    3. Are you open to using the [facility] (Type [facility]. no   4. Which pharmacy/location (including street and city if local pharmacy) is medication to be sent to?  [facility] - [place], [place] - [address]   5. Do they need a 30 day or 90 day supply? 90 day

## 2024-05-29 ENCOUNTER — Ambulatory Visit: Admitting: Physical Therapy

## 2024-06-02 ENCOUNTER — Other Ambulatory Visit: Payer: Self-pay

## 2024-06-02 ENCOUNTER — Encounter: Payer: Self-pay | Admitting: Internal Medicine

## 2024-06-02 MED ORDER — VALSARTAN-HYDROCHLOROTHIAZIDE 320-12.5 MG PO TABS: 1.0000 | ORAL_TABLET | Freq: Every day | ORAL | 2 refills | Status: DC

## 2024-06-03 ENCOUNTER — Other Ambulatory Visit: Payer: Self-pay

## 2024-06-03 MED ORDER — VALSARTAN-HYDROCHLOROTHIAZIDE 320-12.5 MG PO TABS: 1.0000 | ORAL_TABLET | Freq: Every day | ORAL | 2 refills | Status: AC

## 2024-06-05 ENCOUNTER — Telehealth: Payer: Self-pay | Admitting: Internal Medicine

## 2024-06-05 ENCOUNTER — Ambulatory Visit: Admitting: Physical Therapy

## 2024-06-05 NOTE — Telephone Encounter (Signed)
°*  STAT* If patient is at the pharmacy, call can be transferred to refill team.   1. Which medications need to be refilled? (please list name of each medication and dose if known)   pantoprazole  (PROTONIX ) 40 MG tablet    4. Which pharmacy/location (including street and city if local pharmacy) is medication to be sent to?  [facility] - [place], [place] - [address]     5. Do they need a 30 day or 90 day supply? 90

## 2024-06-06 MED ORDER — PANTOPRAZOLE SODIUM 40 MG PO TBEC: 40.0000 mg | DELAYED_RELEASE_TABLET | Freq: Every day | ORAL | 0 refills | Status: AC

## 2024-06-06 NOTE — Telephone Encounter (Signed)
 Refill sent, 1 time, per Dr. Santo.

## 2024-06-17 ENCOUNTER — Ambulatory Visit: Admitting: Physical Therapy

## 2024-06-17 DIAGNOSIS — R279 Unspecified lack of coordination: Secondary | ICD-10-CM | POA: Insufficient documentation

## 2024-06-17 DIAGNOSIS — M6281 Muscle weakness (generalized): Secondary | ICD-10-CM | POA: Diagnosis present

## 2024-06-17 DIAGNOSIS — R293 Abnormal posture: Secondary | ICD-10-CM | POA: Diagnosis present

## 2024-06-17 NOTE — Therapy (Signed)
 " OUTPATIENT PHYSICAL THERAPY FEMALE PELVIC TREATMENT/DISCHARGE SUMMARY   Patient Name: Sierra Glover MRN: [id number] DOB:[birth year], 68 y.o., female Today's Date: 06/17/2024  END OF SESSION:  PT End of Session - 06/17/24 1516     Visit Number 3    Number of Visits 10    Date for Recertification  06/05/24    Authorization Type [employer]    Authorization Time Period [employer] approved 10 visits 03/27/24-09/22/24 [id number]    Authorization - Number of Visits 10    PT Start Time 0250    PT Stop Time 0320    PT Time Calculation (min) 30 min    Activity Tolerance Patient tolerated treatment well    Behavior During Therapy [facility] for tasks assessed/performed           Past Medical History:  Diagnosis Date   Anemia    before my hysterectomy   Arthritis 2021   Dizziness    DVT (deep venous thrombosis) (HCC)    Many, many years ago.   HTN (hypertension)    Nausea    Palpitation    Serum calcium  elevated    Past Surgical History:  Procedure Laterality Date   ABDOMINAL HYSTERECTOMY  10 years   COLONOSCOPY     FRACTURE SURGERY  12/2020   partal hysterectomy  2009   TIBIA IM NAIL INSERTION Right 01/07/2021   Procedure: INTRAMEDULLARY (IM) NAIL TIBIAL;  Surgeon: Kendal Franky SQUIBB, MD;  Location: [facility] OR;  Service: Orthopedics;  Laterality: Right;   TUBAL LIGATION     Patient Active Problem List   Diagnosis Date Noted   Abdominal bloating 03/24/2024   Influenza vaccination declined 03/24/2024   Other fatigue 01/26/2024   Incontinence of feces 01/26/2024   Encounter for annual health examination 09/09/2023   Fever 08/30/2023   Influenza A 08/30/2023   Scalp lesion 08/09/2023   Vertigo 08/09/2023   Mid back pain 07/14/2023   Hyperlipidemia LDL goal <70 03/20/2023   Aortic atherosclerosis 03/20/2023   Gastroesophageal reflux disease without esophagitis 02/27/2023   Pure hypercholesterolemia 09/03/2022   History of anemia due to vitamin B12 deficiency 09/03/2022   Coronary  artery calcification 03/31/2022   Esophageal dysmotility 03/31/2022   Allergic rhinitis due to other allergen 01/16/2022   Long COVID 06/28/2021   Closed fracture of right distal tibia 01/07/2021   Closed displaced oblique fracture of shaft of right tibia 01/05/2021   Disorder of bone and cartilage 04/03/2019   Brawny scleritis, 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   Localized superficial swelling, mass, or lump 04/03/2019   Essential hypertension, benign 05/12/2018   Female climacteric state 05/12/2018   Abnormal glucose level 05/27/2013   Menopausal symptom 05/27/2013   Vitamin D  deficiency 11/17/2010   PCP: Jarold Medici, MD   REFERRING PROVIDER: Jarold Medici, MD   REFERRING DIAG: R15.9 (ICD-10-CM) - Incontinence of feces, unspecified fecal incontinence type  THERAPY DIAG:  Muscle weakness (generalized)  Unspecified lack of coordination  Abnormal posture  Rationale for Evaluation and Treatment: Rehabilitation  ONSET DATE: 3-4 months   SUBJECTIVE:  SUBJECTIVE STATEMENT: She is doing well today. Pt reports that her pelvic floor had gotten better - she was noticing less fecal smearing. This has since started back ( a couple of days ago) and she thinks this is related to what she eats. She finds that grapes, dairy, apples, and chocolate makes her stool softer. She hasn't had any urinary leakage or pelvic pain to report.   Eval: Patient reports that when she goes to void, sometimes she will wipe and she will see fecal smearing on the toilet paper. This does not happen every time she voids. When she has firm stools, she does not experience this. When she eats foods that make her stool softer, she notices the fecal smearing.  Fluid intake: coffee in the mornings, tea, 1 bottle of water per day (16.9 fl oz)  sometimes 2-3 if she is doing better   FUNCTIONAL LIMITATIONS: this affects her when she is out and about   PERTINENT HISTORY:  Medications for current condition: OTC medication for bloating  Surgeries: None Other: N/A Sexual abuse: No  PAIN:  Are you having pain? No NPRS scale: 0/10  PRECAUTIONS: None  RED FLAGS: None   WEIGHT BEARING RESTRICTIONS: No  FALLS:  Has patient fallen in last 6 months? 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, seated majority of day   ACTIVITY LEVEL : was walking 1-2 miles in the mornings, but hasn't been able to recently   PLOF: Independent with basic ADLs  PATIENT GOALS: to be better at bowel evacuation, address the pelvic floor musculature   BOWEL MOVEMENT: Pain with bowel movement: No Type of bowel movement:Type (Bristol Stool Scale) with fecal smearing, type 6, type 4 on normal day  , Frequency 1-2x/day, Strain no, and Splinting no Fully empty rectum: Yes:   Leakage: Yes: after a type 6 bowel movement                                                      Caused by: unknown  Pads: Yes: if going out in public  Fiber supplement/laxative No  URINATION: Pain with urination: No Fully empty bladder: Yes:                                   Post-void dribble: No Stream: Strong Urgency: Yes  Frequency:during the day within normal limits                                                           Nocturia: Noonly gets up 1 time per night    Leakage: none Pads/briefs: No  INTERCOURSE: not currently sexually active   PREGNANCY: Vaginal deliveries 1 Tearing No Episiotomy No C-section deliveries 0 Currently pregnant No  PROLAPSE: None  OBJECTIVE:  Note: Objective measures were completed at Evaluation unless otherwise noted.  PATIENT SURVEYS:  PFIQ-7: 45  COGNITION: Overall cognitive status: Within functional limits for tasks assessed     SENSATION: Light touch: Appears intact  LUMBAR SPECIAL  TESTS:  Straight leg raise test: Positive and Single leg stance  test: Positive  FUNCTIONAL TESTS:  Single leg stance:  Rt: +  Lt: + Sit-up test: 1/3 Squat: within normal limits  Bed mobility: within functional limits   GAIT: Assistive device utilized: None Comments: Mild trendelenburg gait pattern with ambulation   POSTURE: rounded shoulders, forward head, and flexed trunk   LUMBARAROM/PROM: within normal limits for all motions with no pain  LOWER EXTREMITY ROM: within normal limits for all motions bilaterally with no pain   LOWER EXTREMITY MMT: 4/5 bilateral knees and hips grossly   PALPATION:  General: no tenderness to palpation of bilateral adductors or hip flexors in supine   Pelvic Alignment: within normal limits   Abdominal: abdominal bracing at rest  Diastasis: No Distortion: No  Breathing: apical breathing pattern with decreased rib excursion with inhalation/exhalation  Scar tissue: No                External Perineal Exam: mild dryness present with anal wink reflex intact; external hemorrhoid present with no redness/bleeding/irritation                              Internal Pelvic Floor: Patient fully consents to today's internal rectal examination. She demonstrates an intact anal wink reflex and has mild dryness at the anal sphincter. She has an external hemorrhoid that is not actively irritated. She demonstrates a lack of coordination in the rectal musculature, which is most likely affecting her ability to empty when she passes softer bowel movements. With internal cueing, patient was able to lengthen her rectum with inhalation and shorten the muscles with exhalation. Small amount of fecal smearing present on glove with removal of finger, indicative of lack of complete rectal emptying with passing Bms. No pain following today's exam.   Patient confirms identification and approves PT to assess internal pelvic floor and treatment Yes No emotional/communication barriers  or cognitive limitation. Patient is motivated to learn. Patient understands and agrees with treatment goals and plan. PT explains patient will be examined in standing, sitting, and lying down to see how their muscles and joints work. When they are ready, they will be asked to remove their underwear so PT can examine their perineum. The patient is also given the option of providing their own chaperone as one is not provided in our facility. The patient also has the right and is explained the right to defer or refuse any part of the evaluation or treatment including the internal exam. With the patient's consent, PT will use one gloved finger to gently assess the muscles of the pelvic floor, seeing how well it contracts and relaxes and if there is muscle symmetry. After, the patient will get dressed and PT and patient will discuss exam findings and plan of care. PT and patient discuss plan of care, schedule, attendance policy and HEP activities.  PELVIC MMT:   MMT eval  Vaginal N/A  Internal Anal Sphincter 4/5  External Anal Sphincter 3/5  Puborectalis 4/5  Diastasis Recti N/A  (Blank rows = not tested)  TONE: Within normal limits   PROLAPSE: N/A  TODAY'S TREATMENT:  DATE:   EVAL 03/27/24: Examination completed, findings reviewed, pt educated on POC, HEP, and self care. Pt motivated to participate in PT and agreeable to attempt recommendations.   Hooklying pelvic floor contractions + diaphragmatic breathing 2x10  Hooklying quick flick contractions + diaphragmatic breathing 2x10  Bowel emptying techniques Relative pelvic floor anatomy   04/16/24: Hooklying pelvic floor contraction + diaphragmatic breathing 2x10  Hooklying quick flick pelvic floor contractions + diaphragmatic breathing 2x10  Bridge + adductor ball squeeze + diaphragmatic breathing 2x10  Sit to stand +  adductor ball squeeze + diaphragmatic breathing 2x10  Sidelying clamshell + reverse clamshell + diaphragmatic breathing 2x10   06/17/24: Seated  pelvic floor contraction + diaphragmatic breathing 2x10  Seated  quick flick pelvic floor contractions + diaphragmatic breathing 2x10  Bridge + adductor ball squeeze + diaphragmatic breathing 2x10  Sit to stand + adductor ball squeeze + diaphragmatic breathing 2x10  Sidelying clamshell + reverse clamshell + diaphragmatic breathing 2x10 BRAT diet for stool bulkage: bananas, rice, applesauce, toast  Water intake for constipation management: 77.5 fl oz  Balloon breathing while on the toilet to eliminate stool  Squatty potty recommendation for stool elimination   PATIENT EDUCATION:  Education details: relative anatomy, water intake recommendation, bladder and bowel irritants, education on how the diaphragm works with the pelvic floor  Person educated: Patient Education method: Programmer, Multimedia, Facilities Manager, Actor cues, Verbal cues, and Handouts Education comprehension: verbalized understanding, returned demonstration, verbal cues required, tactile cues required, and needs further education  HOME EXERCISE PROGRAM: Access Code: [email or web address] URL: [email or web address] Date: 06/17/2024 Prepared by: Celena Domino  Exercises - Seated Pelvic Floor Contraction  - 1 x daily - 7 x weekly - 2 sets - 10 reps - Seated Quick Flick Pelvic Floor Contractions  - 1 x daily - 7 x weekly - 2 sets - 10 reps - Supine Bridge with Mini Swiss Ball Between Knees  - 1 x daily - 7 x weekly - 2 sets - 10 reps - Sit to Stand with Mercer Between Knees  - 1 x daily - 7 x weekly - 2 sets - 10 reps - Clamshell  - 1 x daily - 7 x weekly - 2 sets - 10 reps - Sidelying Reverse Clamshell  - 1 x daily - 7 x weekly - 2 sets - 10 reps  Patient Education - Get To Know Your Pelvic Floor- Female - Bowel Emptying Techniques  ASSESSMENT:  CLINICAL IMPRESSION: Patient is a 68  y.o. female  who was seen today for physical therapy treatment/discharge for fecal incontinence/smearing. Pt has attended 3 sessions of PFPT and has noticed that her fecal smearing symptoms are solely related to what she eats. When she consumes dairy, she has softer stool that is more difficult to fully evacuate. When her stool is formed, she has no fecal smearing at all. We discussed the BRAT diet for stool bulkage, squatty potty recommendation, water intake, and balloon breathing while on the toilet to promote stool evacuation. Pt reports no urinary or other bowel concerns. She has no pain and is appropriate for discharge at this time.   OBJECTIVE IMPAIRMENTS: decreased coordination, decreased endurance, decreased mobility, decreased ROM, and decreased strength.   ACTIVITY LIMITATIONS: continence and toileting  PARTICIPATION LIMITATIONS: community activity  PERSONAL FACTORS: Age and Past/current experiences are also affecting patient's functional outcome.   REHAB POTENTIAL: Good  CLINICAL DECISION MAKING: Stable/uncomplicated  EVALUATION COMPLEXITY: Low   GOALS: Goals reviewed with patient? Yes  SHORT TERM GOALS: Target date: 04/24/2024  Pt will be independent with HEP.  Baseline: Goal status: MET 06/17/24  2.  Pt will be able to correctly perform diaphragmatic breathing and appropriate pressure management in order to prevent worsening rectal wall laxity and improve pelvic floor A/ROM.  Baseline:  Goal status: MET 06/17/24  3.  Pt will be independent with use of squatty potty, relaxed toileting mechanics, and improved bowel movement techniques in order to increase ease of bowel movements and complete evacuation.  Baseline:  Goal status: MET 06/17/24  LONG TERM GOALS: Target date: 09/25/2024  Pt will be independent with advanced HEP.  Baseline:  Goal status: MET 06/17/24  2.  Pt to demonstrate improved coordination of pelvic floor and breathing mechanics with 10# squat with  appropriate synergistic patterns to decrease pain and leakage at least 75% of the time for improved ability to complete a 30 minute workout with strain at pelvic floor and symptoms.   Baseline:  Goal status: MET 06/17/24  3.  Pt will report her BMs are complete due to improved bowel habits and evacuation techniques to prevent fecal smearing when voiding to improve quality of life.  Baseline:  Goal status: MET 06/17/24  PHYSICAL THERAPY DISCHARGE SUMMARY  Visits from Start of Care: 3  Current functional level related to goals / functional outcomes: See above   Remaining deficits: See above   Education / Equipment: See above   Patient agrees to discharge. Patient goals were met. Patient is being discharged due to meeting the stated rehab goals.   Celena JAYSON Domino, PT 06/17/2024, 3:16 PM [facility] [address] [place], [place] [address] Phone # [phone] Fax [phone]  "

## 2024-06-17 NOTE — Patient Instructions (Signed)
 BRAT diet: this food pattern helps bulk up stool  Bananas  Rice  Applesauce  Toast   Water intake recommendation: the daily amount of water that we are recommended to intake is half of our body weight in fluid ounces  77.5 fl oz of water per day (or 4 water bottles that are standard)  Toileting Mechanics 101: Try using a squatty potty (7 in) toilet stool to see if this helps you empty your bowels more effectively  Balloon breathing: when you feel the need to push to empty your bowels, first take a deep breath in, making your belly big like a balloon. When you exhale, this is when you want to try to gently push the stool out.

## 2024-08-15 ENCOUNTER — Ambulatory Visit: Admitting: Internal Medicine

## 2024-09-15 ENCOUNTER — Encounter: Payer: Self-pay | Admitting: Internal Medicine

## 2024-10-01 ENCOUNTER — Ambulatory Visit: Payer: Self-pay

## 2024-10-01 ENCOUNTER — Ambulatory Visit
